# Patient Record
Sex: Male | Born: 1980
Health system: Southern US, Community
[De-identification: ages and names within clinical notes are randomized; demographics above are authoritative.]

## PROBLEM LIST (undated history)

## (undated) DIAGNOSIS — I513 Intracardiac thrombosis, not elsewhere classified: Secondary | ICD-10-CM

## (undated) DIAGNOSIS — R0609 Other forms of dyspnea: Secondary | ICD-10-CM

## (undated) DIAGNOSIS — F329 Major depressive disorder, single episode, unspecified: Secondary | ICD-10-CM

## (undated) DIAGNOSIS — R0600 Dyspnea, unspecified: Secondary | ICD-10-CM

## (undated) DIAGNOSIS — F32A Depression, unspecified: Secondary | ICD-10-CM

## (undated) DIAGNOSIS — I5022 Chronic systolic (congestive) heart failure: Secondary | ICD-10-CM

## (undated) DIAGNOSIS — I5189 Other ill-defined heart diseases: Secondary | ICD-10-CM

## (undated) DIAGNOSIS — I10 Essential (primary) hypertension: Secondary | ICD-10-CM

## (undated) DIAGNOSIS — I509 Heart failure, unspecified: Secondary | ICD-10-CM

## (undated) DIAGNOSIS — H547 Unspecified visual loss: Secondary | ICD-10-CM

## (undated) DIAGNOSIS — F99 Mental disorder, not otherwise specified: Secondary | ICD-10-CM

## (undated) HISTORY — DX: Mental disorder, not otherwise specified: F99

## (undated) HISTORY — DX: Chronic systolic (congestive) heart failure: I50.22

## (undated) HISTORY — DX: Dyspnea, unspecified: R06.00

## (undated) HISTORY — DX: Heart failure, unspecified: I50.9

## (undated) HISTORY — PX: TOOTH EXTRACTION: SUR596

## (undated) HISTORY — PX: EYE SURGERY: SHX253

## (undated) HISTORY — DX: Other forms of dyspnea: R06.09

## (undated) HISTORY — DX: Intracardiac thrombosis, not elsewhere classified: I51.3

## (undated) HISTORY — DX: Other ill-defined heart diseases: I51.89

## (undated) NOTE — *Deleted (*Deleted)
***In Progress*** PCP:  [name], DO    Cardiologist:  No primary care provider on file. Primary HF: Dr. [name]  HPI:  Kenneth Robertson is a 40 y.o. male with history of HTN and nonischemic cardiomyopathy who presents for followup of CHF. He was admitted in 12/17 after 1 month of worsening dyspnea. He was volume overloaded on exam with EF 10-15% on echo. He was diuresed and RHC/LHC done. This showed no CAD. Cardiac output was low. Cardiac MRI in 4/18 showed severe LV dilation, EF 24%, prominent trabeculation (possible noncompaction), RV insertion LGE (nonspecific).   Echo in 6/20 showed that LV was severely dilated with EF 25-30%. CPX done in 7/19 surprisingly did not show a significant CHF limitation.  He saw Dr Graciela Husbands and ICD was recommended but he deferred.   Echo repeated 4/21, EF 35-40%, diffuse hypokinesis, normal RV.    He did not tolerate higher dose of Entresto, 49-51 mg dose, due to dizziness. He is back on 24-26 BID dose.   He recently returned to HF Clinic for follow up on 01/30/20. Was doing well. Denied dyspnea. No exertional symptoms. Denied orthopnea/PND or Mill. His weight was up 8 lb since his last visit in April. Reported full med compliance. BP was well controlled. Compliant w/ coumadin. Denied abnormal bleeding.   Today he returns to HF clinic for pharmacist medication titration. At last visit with [facility], Farxiga 10 mg daily***.   Marland Kitchen Shortness of breath/dyspnea on exertion? {YES [id number]  . Orthopnea/PND? {YES [id number] . Edema? {YES [id number] . Lightheadedness/dizziness? {YES [id number] . Daily weights at home? {YES [id number] . Blood pressure/heart rate monitoring at home? {YES [id number] . Following low-sodium/fluid-restricted diet? {YES NO:22349}  HF Medications: Carvedilol 6.25 mg BID Entresto 24/26 mg BID Spironolactone 25 mg daily Dapagliflozin 10 mg daily Hydralazine 25 mg TID Isosorbide mononitrate 30 mg daily Digoxin 0.125 mg daily  Furosemide 20 mg daily Potassium chloride 20 mEq daily  Has the patient been experiencing any side effects to the medications prescribed?  {YES NO:22349}  Does the patient have any problems obtaining medications due to transportation or finances?   {YES NO:22349}  Understanding of regimen: {excellent/good/fair/poor:19665} Understanding of indications: {excellent/good/fair/poor:19665} Potential of compliance: {excellent/good/fair/poor:19665} Patient understands to avoid NSAIDs. Patient understands to avoid decongestants.    Pertinent Lab Values: . Serum creatinine ***, BUN ***, Potassium ***, Sodium ***, BNP ***, Magnesium ***, Digoxin ***   Vital Signs: . Weight: *** (last clinic weight: ***) . Blood pressure: ***  . Heart rate: ***   Assessment: 1. Chronic systolic CHF: Nonischemic cardiomyopathy. EF 10-15% by echo in 12/17. No coronary disease and low output on cath. HIV negative, SPEP negative. No family history of early cardiomyopathy (mother with CHF around 68). No ETOH/drugs. Possible viral myocarditis. Cardiac MRI in 4/18 with severe LV dilation, EF 24%, RV insertion site LGE (nonspecific), cannot rule out noncompaction. Echo in 5/19 showed EF 25-30% with severe LV dilation.CPX in 7/19 was quite good with minimal cardiac limitation. Echo in 6/20 showed severe LV dilation and EF 25-30%, normal RV systolic function.  Most recent echo 4/21 showed EF improved to 35-40%.   - NYHA Class II. Wt up 8 lb since last visit in April - He did not tolerate 49-51 mg dose of Entresto due to dizziness -Continue carvedilol 6.25 mg BID. - Continue Entresto 24-26 mg BID - Continue Spironolactone 25 mg daily - Continue Farxiga 10 mg daily.   - Continue digoxin 0.125 mg daily - Continue current hydralazine  25 mg TID and Imdur 30 mg daily.  - EF now out of range for ICD  2. LV thrombus: Not seen on more recent studies.   - Continue warfarin for anticoagulation - followed by coumadin  clinic    Plan: 1) Medication changes: Based on clinical presentation, vital signs and recent labs will *** 2) Labs: *** 3) Follow-up: ***   Karle Plumber, PharmD, BCPS, BCCP, CPP Heart Failure Clinic Pharmacist [phone]

---
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
## 2002-08-07 ENCOUNTER — Emergency Department (HOSPITAL_COMMUNITY): Admission: EM | Admit: 2002-08-07 | Discharge: 2002-08-07 | Payer: Self-pay | Admitting: Emergency Medicine

## 2002-08-15 ENCOUNTER — Emergency Department (HOSPITAL_COMMUNITY): Admission: EM | Admit: 2002-08-15 | Discharge: 2002-08-15 | Payer: Self-pay | Admitting: Emergency Medicine

## 2006-04-18 ENCOUNTER — Emergency Department (HOSPITAL_COMMUNITY): Admission: EM | Admit: 2006-04-18 | Discharge: 2006-04-18 | Payer: Self-pay | Admitting: Emergency Medicine

## 2006-05-24 ENCOUNTER — Emergency Department (HOSPITAL_COMMUNITY): Admission: EM | Admit: 2006-05-24 | Discharge: 2006-05-24 | Payer: Self-pay | Admitting: Emergency Medicine

## 2011-05-31 ENCOUNTER — Emergency Department (HOSPITAL_COMMUNITY)
Admission: EM | Admit: 2011-05-31 | Discharge: 2011-05-31 | Disposition: A | Discharge status: Home / Self Care | Payer: Self-pay | Attending: Emergency Medicine | Admitting: Emergency Medicine

## 2011-05-31 ENCOUNTER — Encounter (HOSPITAL_COMMUNITY): Payer: Self-pay | Admitting: *Deleted

## 2011-05-31 DIAGNOSIS — K029 Dental caries, unspecified: Secondary | ICD-10-CM | POA: Insufficient documentation

## 2011-05-31 DIAGNOSIS — K0889 Other specified disorders of teeth and supporting structures: Secondary | ICD-10-CM

## 2011-05-31 DIAGNOSIS — K089 Disorder of teeth and supporting structures, unspecified: Secondary | ICD-10-CM | POA: Insufficient documentation

## 2011-05-31 MED ORDER — IBUPROFEN 800 MG PO TABS: 800.0000 mg | ORAL_TABLET | Freq: Three times a day (TID) | ORAL | Status: AC

## 2011-05-31 MED ORDER — BUPIVACAINE-EPINEPHRINE 0.5% -1:200000 IJ SOLN
10.0000 mL | Freq: Once | INTRAMUSCULAR | Status: DC
  Filled 2011-05-31: qty 10

## 2011-05-31 MED ORDER — PENICILLIN V POTASSIUM 500 MG PO TABS: 500.0000 mg | ORAL_TABLET | Freq: Three times a day (TID) | ORAL | Status: AC

## 2011-05-31 MED ORDER — BUPIVACAINE-EPINEPHRINE PF 0.5-1:200000 % IJ SOLN
INTRAMUSCULAR | Status: AC
  Administered 2011-05-31: 18 mg
  Filled 2011-05-31: qty 3.6

## 2011-05-31 MED ORDER — BUPIVACAINE-EPINEPHRINE PF 0.5-1:200000 % IJ SOLN
3.6000 mL | Freq: Once | INTRAMUSCULAR | Status: AC
  Administered 2011-05-31: 18 mg

## 2011-05-31 MED ORDER — HYDROCODONE-ACETAMINOPHEN 5-325 MG PO TABS: 1.0000 | ORAL_TABLET | Freq: Four times a day (QID) | ORAL | Status: AC | PRN

## 2011-05-31 NOTE — ED Notes (Signed)
Pt expresses that he is having a toothache on the lower left side of his mouth x 2 days.  Pt states that the only thing that will relieve the pain is "really cold cold water".

## 2011-05-31 NOTE — Discharge Instructions (Signed)

## 2011-05-31 NOTE — ED Notes (Signed)
Dental pain lower jaw x 2 days.

## 2011-05-31 NOTE — ED Provider Notes (Signed)
History     CSN: [id number]  Arrival date & time 05/31/11  0617  6:28 AM HPI Patient reports severe left lower toothache for 2 days. Reports his to see Dr. Warren Danes but has not seen him since 2007. Denies fever, purulent drainage, mass, difficulty swallowing difficulty speaking, sore throat, neck pain. Patient is a 31 y.o. male presenting with tooth pain. The history is provided by the patient.  Dental PainPrimary symptoms do not include oral lesions or headaches. The symptoms began 2 days ago. The symptoms are worsening. The symptoms occur constantly.  Additional symptoms include: dental sensitivity to temperature. Additional symptoms do not include: gum swelling, gum tenderness, purulent gums, trismus, facial swelling, trouble swallowing, pain with swallowing, ear pain, hearing loss and swollen glands.    No past medical history on file.  No past surgical history on file.  No family history on file.  History  Substance Use Topics  . Smoking status: Not on file  . Smokeless tobacco: Not on file  . Alcohol Use:       Review of Systems  HENT: Positive for dental problem. Negative for hearing loss, ear pain, facial swelling, mouth sores, trouble swallowing and neck stiffness.   Eyes: Negative for photophobia and pain.  Neurological: Negative for weakness and headaches.    Allergies  Review of patient's allergies indicates no known allergies.  Home Medications  No current outpatient prescriptions on file.  BP 157/114  Pulse 88  Temp(Src) 99 F (37.2 C) (Oral)  Resp 18  SpO2 100%  Physical Exam  Vitals reviewed. Constitutional: He is oriented to person, place, and time. He appears well-developed and well-nourished.  HENT:  Head: Normocephalic and atraumatic.  Nose: Nose normal.  Mouth/Throat: Oropharynx is clear and moist. No oropharyngeal exudate.    Eyes: Pupils are equal, round, and reactive to light.  Lymphadenopathy:    He has no cervical adenopathy.    Neurological: He is alert and oriented to person, place, and time.  Skin: Skin is warm and dry. No rash noted. No erythema. No pallor.  Psychiatric: He has a normal mood and affect. His behavior is normal.    ED Course  Dental Date/Time: 05/31/2011 7:09 AM Performed by: Thomasene Lot Authorized by: Thomasene Lot Consent: Verbal consent obtained. Consent given by: patient Patient understanding: patient states understanding of the procedure being performed Imaging studies: imaging studies not available Patient identity confirmed: verbally with patient Time out: Immediately prior to procedure a "time out" was called to verify the correct patient, procedure, equipment, support staff and site/side marked as required. Local anesthesia used: yes Anesthesia: nerve block Local anesthetic: bupivacaine 0.5% with epinephrine Anesthetic total: 3 ml Patient tolerance: Patient tolerated the procedure well with no immediate complications. Comments: Inferior Alveolar nerve block on left side using a 27 G needle and 3cc of bupivicaine     MDM    Provided patient with  4 Vicodin, Penicillin, and ibuprofen. Also gave the patient a referral to Dr. Lucky Cowboy, DDS. Advise to follow-up today or tomorrow. Patient voices understanding and is ready for d/c      Thomasene Lot, Cordelia Poche 05/31/11 0710  Medical screening examination/treatment/procedure(s) were performed by non-physician practitioner and as supervising physician I was immediately available for consultation/collaboration.  Sunnie Nielsen, MD 06/01/11 [phone]

## 2011-09-06 ENCOUNTER — Emergency Department (HOSPITAL_COMMUNITY)
Admission: EM | Admit: 2011-09-06 | Discharge: 2011-09-07 | Disposition: A | Discharge status: Other Acute Inpatient Hospital | Payer: Self-pay | Attending: Emergency Medicine | Admitting: Emergency Medicine

## 2011-09-06 ENCOUNTER — Encounter (HOSPITAL_COMMUNITY): Payer: Self-pay | Admitting: Emergency Medicine

## 2011-09-06 DIAGNOSIS — F329 Major depressive disorder, single episode, unspecified: Secondary | ICD-10-CM

## 2011-09-06 DIAGNOSIS — R079 Chest pain, unspecified: Secondary | ICD-10-CM | POA: Insufficient documentation

## 2011-09-06 HISTORY — DX: Major depressive disorder, single episode, unspecified: F32.9

## 2011-09-06 HISTORY — DX: Depression, unspecified: F32.A

## 2011-09-06 HISTORY — DX: Unspecified visual loss: H54.7

## 2011-09-06 LAB — CBC
MCH: 28.3 pg (ref 26.0–34.0)
MCHC: 35.3 g/dL (ref 30.0–36.0)
MCV: 80.3 fL (ref 78.0–100.0)
Platelets: 282 10*3/uL (ref 150–400)
RBC: 5.58 MIL/uL (ref 4.22–5.81)
RDW: 13.2 % (ref 11.5–15.5)

## 2011-09-06 LAB — COMPREHENSIVE METABOLIC PANEL
AST: 21 U/L (ref 0–37)
CO2: 28 mEq/L (ref 19–32)
Calcium: 9.4 mg/dL (ref 8.4–10.5)
Creatinine, Ser: 1.07 mg/dL (ref 0.50–1.35)
GFR calc Af Amer: >90 mL/min (ref >90)
GFR calc non Af Amer: >90 mL/min (ref >90)
Sodium: 138 mEq/L (ref 135–145)
Total Protein: 7.4 g/dL (ref 6.0–8.3)

## 2011-09-06 LAB — RAPID URINE DRUG SCREEN, HOSP PERFORMED
Amphetamines: NOT DETECTED
Barbiturates: NOT DETECTED
Benzodiazepines: NOT DETECTED
Cocaine: NOT DETECTED

## 2011-09-06 NOTE — ED Provider Notes (Signed)
History     CSN: [id number]  Arrival date & time 09/06/11  1120   First MD Initiated Contact with Patient 09/06/11 1133     Chief complaint: depressed   (Consider location/radiation/quality/duration/timing/severity/associated sxs/prior treatment) The history is provided by the patient.  pt with hx depression, w worsening depression in past couple weeks. Notes multiple life stressors. Feels very depressed. Intermittent thoughts of suicide, 'jumping off bridge'. Denies overdose or attempt at self harm. No recent med use. Has no pcp or therapist/psychiatrist. States in past couple weeks also hearing voices telling himself to hurt himself. Denies any recent physical illness. No headaches. No cp or sob. No abd pain. Eating normally. Some trouble sleeping at night. Denies etoh or substance abuse.     No past medical history on file.  No past surgical history on file.  No family history on file.  History  Substance Use Topics  . Smoking status: Not on file  . Smokeless tobacco: Not on file  . Alcohol Use:       Review of Systems  Constitutional: Negative for fever.  HENT: Negative for neck pain.   Eyes: Negative for pain.  Respiratory: Negative for shortness of breath.   Cardiovascular: Negative for chest pain.  Gastrointestinal: Negative for abdominal pain.  Genitourinary: Negative for flank pain.  Musculoskeletal: Negative for back pain and gait problem.  Skin: Negative for rash.  Neurological: Negative for weakness, numbness and headaches.  Hematological: Does not bruise/bleed easily.  Psychiatric/Behavioral: Positive for dysphoric mood.    Allergies  Review of patient's allergies indicates no known allergies.  Home Medications   Current Outpatient Rx  Name Route Sig Dispense Refill  . ASPIRIN EC 81 MG PO TBEC Oral Take 325 mg by mouth every 6 (six) hours as needed. Pain      BP 139/107  Pulse 68  Temp(Src) 98.4 F (36.9 C) (Oral)  Resp 16  SpO2  99%  Physical Exam  Nursing note and vitals reviewed. Constitutional: He is oriented to person, place, and time. He appears well-developed and well-nourished. No distress.  HENT:  Head: Atraumatic.  Eyes: Conjunctivae are normal. Pupils are equal, round, and reactive to light. No scleral icterus.  Neck: Normal range of motion. Neck supple. No tracheal deviation present. No thyromegaly present.  Cardiovascular: Normal rate, regular rhythm, normal heart sounds and intact distal pulses.   Pulmonary/Chest: Effort normal and breath sounds normal. No accessory muscle usage. No respiratory distress.  Abdominal: Soft. He exhibits no distension. There is no tenderness.  Musculoskeletal: Normal range of motion.  Neurological: He is alert and oriented to person, place, and time.       Steady gait  Skin: Skin is warm and dry.  Psychiatric:       Depressed mood.     ED Course  Procedures (including critical care time)  Results for orders placed during the hospital encounter of 09/06/11  CBC      Component Value Range   WBC 3.8 (*) 4.0 - 10.5 (K/uL)   RBC 5.58  4.22 - 5.81 (MIL/uL)   Hemoglobin 15.8  13.0 - 17.0 (g/dL)   HCT 84.6  96.2 - 95.2 (%)   MCV 80.3  78.0 - 100.0 (fL)   MCH 28.3  26.0 - 34.0 (pg)   MCHC 35.3  30.0 - 36.0 (g/dL)   RDW 84.1  32.4 - 40.1 (%)   Platelets 282  150 - 400 (K/uL)  COMPREHENSIVE METABOLIC PANEL      Component Value  Range   Sodium 138  135 - 145 (mEq/L)   Potassium 3.5  3.5 - 5.1 (mEq/L)   Chloride 100  96 - 112 (mEq/L)   CO2 28  19 - 32 (mEq/L)   Glucose, Bld 97  70 - 99 (mg/dL)   BUN 8  6 - 23 (mg/dL)   Creatinine, Ser 1.61  0.50 - 1.35 (mg/dL)   Calcium 9.4  8.4 - 09.6 (mg/dL)   Total Protein 7.4  6.0 - 8.3 (g/dL)   Albumin 4.0  3.5 - 5.2 (g/dL)   AST 21  0 - 37 (U/L)   ALT 23  0 - 53 (U/L)   Alkaline Phosphatase 90  39 - 117 (U/L)   Total Bilirubin 0.5  0.3 - 1.2 (mg/dL)   GFR calc non Af Amer >90  >90 (mL/min)   GFR calc Af Amer >90  >90  (mL/min)  URINE RAPID DRUG SCREEN (HOSP PERFORMED)      Component Value Range   Opiates NONE DETECTED  NONE DETECTED    Cocaine NONE DETECTED  NONE DETECTED    Benzodiazepines NONE DETECTED  NONE DETECTED    Amphetamines NONE DETECTED  NONE DETECTED    Tetrahydrocannabinol NONE DETECTED  NONE DETECTED    Barbiturates NONE DETECTED  NONE DETECTED   ETHANOL      Component Value Range   Alcohol, Ethyl (B) <11  0 - 11 (mg/dL)        MDM  Labs. Act team called.   Discussed w mobile crisis counselor, they are working on psych placement.  Signed out to oncoming EDP to follow up with mobile crisis to facilitate psych placement and transfer.      Suzi Roots, MD 09/06/11 [phone]

## 2011-09-06 NOTE — ED Notes (Signed)
Patient's one bag of belongings is in locker 4 of triage. 

## 2011-09-06 NOTE — BHH Counselor (Signed)
Accepted to [facility] per [facility] with mobile Crises. Patient awaiting transport via Sheriff. Witnessed Dawn with mobile crises call EDP s to inform of patients acceptance to [facility].

## 2011-09-06 NOTE — ED Notes (Signed)
Sheriffs Office called to transport pt to [facility]. Sts pt will not be able to be transported until tomorrow morning due to length of trip. Call Sgt. Inda Merlin [phone] at [phone] Tuesday morning and leave message to verify transport.   Pt being accepted at [facility], [address] [place], [place]. Accepting physician Dr. Michaelle Birks. Nurse to nurse report [phone]. Hospital updated on transportation.

## 2011-09-06 NOTE — ED Notes (Addendum)
Pt presenting to ed with c/o medical clearance pt states depression and suicidal thoughts today pt states he was planning to jump off a bridge. Pt is alert and oriented at this time. Pt brought in by mobile crisis. Pt called behavioral health no beds available [facility] called mobile crisis and they brought pt to ed. Mobile crisis states pt has also been hearing voices and seeing people who have passed away. Pt is also having feelings that his family is out to hurt him. Pt states some chest soreness that's been going on for a couple weeks.

## 2011-09-06 NOTE — ED Notes (Signed)
Patient has one bag of belongings in locker 28. 

## 2011-09-06 NOTE — ED Notes (Signed)
Pt states that he has been very depressed since the death of his father in 2008-09-19. States that he hears voices telling him to harm himself and other voices telling him to seek treatment. States that he has a plan to jump off the bridge [place]. Also reports that he is very angry with himself, blames himself for the death of his father. States that he has pushed away his friends and family and feels as if he has no one to talk to.

## 2011-09-06 NOTE — ED Notes (Signed)
Care of pt assumed. Pt reports depression and SI for "years", worse in last few days. Plan to jump off bridge near his house. Was brought in by mobile crisis, who has since left. Denies SA previously. Sts he tried to stab himself in the arm 2-3 weeks ago. No injury noted.

## 2011-09-06 NOTE — ED Notes (Signed)
Pt out to desk to use phone x1.

## 2011-09-06 NOTE — ED Notes (Signed)
Pt belongings in locker. 

## 2011-09-06 NOTE — ED Notes (Signed)
[employer] served IVC papers taken out by EDP, [place]. Pt responded well, verbalizes understanding. Mobile crisis worker at bedside.

## 2011-09-06 NOTE — ED Notes (Signed)
Mobile crisis worker is at bedside

## 2011-09-06 NOTE — ED Notes (Signed)
RN, Elveria Royals made aware of pt BP

## 2011-09-17 ENCOUNTER — Encounter (HOSPITAL_COMMUNITY): Payer: Self-pay

## 2011-09-17 ENCOUNTER — Emergency Department (HOSPITAL_COMMUNITY): Payer: Medicaid Other

## 2011-09-17 ENCOUNTER — Emergency Department (HOSPITAL_COMMUNITY)
Admission: EM | Admit: 2011-09-17 | Discharge: 2011-09-17 | Disposition: A | Discharge status: Home / Self Care | Payer: Medicaid Other | Attending: Emergency Medicine | Admitting: Emergency Medicine

## 2011-09-17 DIAGNOSIS — K219 Gastro-esophageal reflux disease without esophagitis: Secondary | ICD-10-CM | POA: Insufficient documentation

## 2011-09-17 DIAGNOSIS — Z79899 Other long term (current) drug therapy: Secondary | ICD-10-CM | POA: Insufficient documentation

## 2011-09-17 DIAGNOSIS — I10 Essential (primary) hypertension: Secondary | ICD-10-CM | POA: Insufficient documentation

## 2011-09-17 DIAGNOSIS — R0789 Other chest pain: Secondary | ICD-10-CM

## 2011-09-17 DIAGNOSIS — F3289 Other specified depressive episodes: Secondary | ICD-10-CM | POA: Insufficient documentation

## 2011-09-17 DIAGNOSIS — F329 Major depressive disorder, single episode, unspecified: Secondary | ICD-10-CM | POA: Insufficient documentation

## 2011-09-17 HISTORY — DX: Essential (primary) hypertension: I10

## 2011-09-17 LAB — CBC
HCT: 42.5 % (ref 39.0–52.0)
Hemoglobin: 15.1 g/dL (ref 13.0–17.0)
MCH: 28.2 pg (ref 26.0–34.0)
MCHC: 35.5 g/dL (ref 30.0–36.0)
RDW: 13.3 % (ref 11.5–15.5)

## 2011-09-17 LAB — BASIC METABOLIC PANEL
BUN: 11 mg/dL (ref 6–23)
CO2: 27 mEq/L (ref 19–32)
Calcium: 9.1 mg/dL (ref 8.4–10.5)
Chloride: 101 mEq/L (ref 96–112)
Creatinine, Ser: 1.02 mg/dL (ref 0.50–1.35)

## 2011-09-17 MED ORDER — FAMOTIDINE 20 MG PO TABS
20.0000 mg | ORAL_TABLET | Freq: Once | ORAL | Status: AC
  Administered 2011-09-17: 20 mg via ORAL
  Filled 2011-09-17: qty 1

## 2011-09-17 MED ORDER — GI COCKTAIL ~~LOC~~
30.0000 mL | Freq: Once | ORAL | Status: AC
  Administered 2011-09-17: 30 mL via ORAL
  Filled 2011-09-17: qty 30

## 2011-09-17 MED ORDER — OMEPRAZOLE 20 MG PO CPDR: 20.0000 mg | DELAYED_RELEASE_CAPSULE | Freq: Every day | ORAL | Status: DC

## 2011-09-17 MED ORDER — FAMOTIDINE 20 MG PO TABS: 20.0000 mg | ORAL_TABLET | Freq: Two times a day (BID) | ORAL | Status: DC

## 2011-09-17 NOTE — ED Notes (Signed)
Requesting to speak with social Investment banker, operational regarding inability to afford meds. Voicemail left for [employer], Sports coach.

## 2011-09-17 NOTE — ED Notes (Signed)
Requests to see social worker regarding inability to afford medicine.

## 2011-09-17 NOTE — ED Provider Notes (Signed)
History     CSN: [id number]  Arrival date & time 09/17/11  0701   First MD Initiated Contact with Patient 09/17/11 0747      Chief Complaint  Patient presents with  . Chest Pain  . Sore Throat    (Consider location/radiation/quality/duration/timing/severity/associated sxs/prior treatment) Patient is a 31 y.o. male presenting with chest pain and pharyngitis. The history is provided by the patient. No language interpreter was used.  Chest Pain The chest pain began more than 2 weeks ago (approximately 1 month). Chest pain occurs intermittently. The chest pain is unchanged. Associated with: activity. The severity of the pain is mild. The quality of the pain is described as aching and dull. The pain does not radiate. Chest pain is worsened by certain positions (activity). Pertinent negatives for primary symptoms include no fever, no fatigue, no shortness of breath, no cough, no palpitations, no abdominal pain, no nausea, no vomiting and no dizziness.  Pertinent negatives for associated symptoms include no claudication, no diaphoresis, no lower extremity edema, no numbness and no weakness. He tried nothing for the symptoms.    Sore Throat This is a new problem. The current episode started more than 2 days ago. The problem occurs constantly. The problem has not changed since onset.Associated symptoms include chest pain. Pertinent negatives include no abdominal pain, no headaches and no shortness of breath. The symptoms are aggravated by swallowing. He has tried nothing for the symptoms. The treatment provided no relief.    Past Medical History  Diagnosis Date  . Depression   . Visual impairment   . Hypertension     History reviewed. No pertinent past surgical history.  Family History  Problem Relation Age of Onset  . Asthma Mother   . Cancer Father     History  Substance Use Topics  . Smoking status: Never Smoker   . Smokeless tobacco: Not on file  . Alcohol Use: Yes   socially      Review of Systems  Constitutional: Negative for fever, diaphoresis, activity change, appetite change and fatigue.  HENT: Positive for congestion and sore throat. Negative for rhinorrhea, neck pain and neck stiffness.   Respiratory: Negative for cough and shortness of breath.   Cardiovascular: Positive for chest pain. Negative for palpitations and claudication.  Gastrointestinal: Negative for nausea, vomiting, abdominal pain and diarrhea.  Genitourinary: Negative for dysuria, urgency, frequency and flank pain.  Musculoskeletal: Negative for myalgias, back pain and arthralgias.  Neurological: Negative for dizziness, weakness, light-headedness, numbness and headaches.  Psychiatric/Behavioral: Negative for suicidal ideas, self-injury and dysphoric mood. The patient is not nervous/anxious.   All other systems reviewed and are negative.    Allergies  Review of patient's allergies indicates no known allergies.  Home Medications   Current Outpatient Rx  Name Route Sig Dispense Refill  . BENZTROPINE MESYLATE 1 MG PO TABS Oral Take 1 mg by mouth 2 (two) times daily.    Marland Kitchen HYDROXYZINE PAMOATE 25 MG PO CAPS Oral Take 25 mg by mouth 3 (three) times daily as needed. For anxiety.    Marland Kitchen MIRTAZAPINE 15 MG PO TABS Oral Take 30 mg by mouth at bedtime.    Marland Kitchen RISPERIDONE 1 MG PO TABS Oral Take 1 mg by mouth 2 (two) times daily.    Marland Kitchen FAMOTIDINE 20 MG PO TABS Oral Take 1 tablet (20 mg total) by mouth 2 (two) times daily. 30 tablet 0  . OMEPRAZOLE 20 MG PO CPDR Oral Take 1 capsule (20 mg total) by mouth  daily. 30 capsule 0    BP 144/113  Pulse 77  Temp 98.2 F (36.8 C) (Oral)  Wt 248 lb 4 oz (112.605 kg)  SpO2 99%  Physical Exam  Nursing note and vitals reviewed. Constitutional: He is oriented to person, place, and time. He appears well-developed and well-nourished. No distress.  HENT:  Head: Normocephalic and atraumatic.  Mouth/Throat: Oropharynx is clear and moist. No oropharyngeal  exudate.  Eyes: Conjunctivae and EOM are normal. Pupils are equal, round, and reactive to light.  Neck: Normal range of motion. Neck supple.  Cardiovascular: Normal rate, regular rhythm, normal heart sounds and intact distal pulses.  Exam reveals no gallop and no friction rub.   No murmur heard. Pulmonary/Chest: Effort normal and breath sounds normal. No respiratory distress. He exhibits tenderness (tenderness on palpation of superior and parasternal chest).  Abdominal: Soft. Bowel sounds are normal. There is tenderness (mild epigastric tenderness on palpation). There is no rebound and no guarding.  Musculoskeletal: Normal range of motion. He exhibits no edema and no tenderness.  Neurological: He is alert and oriented to person, place, and time. No cranial nerve deficit.  Skin: Skin is warm and dry. No rash noted.    ED Course  Procedures (including critical care time)   Date: 09/17/2011  Rate: 76  Rhythm: normal sinus rhythm  QRS Axis: normal  Intervals: normal  ST/T Wave abnormalities: normal  Conduction Disutrbances:none  Narrative Interpretation:   Old EKG Reviewed: none available  Labs Reviewed  BASIC METABOLIC PANEL - Abnormal; Notable for the following:    Glucose, Bld 114 (*)     All other components within normal limits  CBC   Dg Chest 2 View  09/17/2011  *RADIOLOGY REPORT*  Clinical Data: Chest pain with shortness of breath and cough. History of hypertension.  CHEST - 2 VIEW  Comparison: None.  Findings: The heart size and mediastinal contours are normal. The lungs are clear. There is no pleural effusion or pneumothorax. No acute osseous findings are identified.  IMPRESSION: No active cardiopulmonary process.  Original Report Authenticated By: Gerrianne Scale, M.D.     1. Chest pain, atypical   2. GERD (gastroesophageal reflux disease)       MDM  PERC negative with low clinical gestalt for PE.  No concern for ACS. With chest discomfort and associated sore throat  I feel there is a component of reflux esophagitis. He received a GI cocktail and Pepcid in the emergency department. He'll be discharged home on Pepcid and Prilosec. Instructed to establish care with a primary care physician. Provided strict return precautions.        Dayton Bailiff, MD 09/17/11 [phone]

## 2011-09-17 NOTE — Progress Notes (Signed)
Kenneth Robertson from [facility] pharmacy confirms pt eligible for [facility] indigent medication assistance. Updated ED RN Approved Rx tubed to [facility] pharmacy Reviewed homeless IRC information and [facility] indigent program with pt.  Provided with written resources for self pay pcps including general medical clinic and [facility], medication resources, housing resources DSS, health department and [place] crisis programs

## 2011-09-17 NOTE — Discharge Instructions (Signed)
Chest Pain (Nonspecific) It is often hard to give a specific diagnosis for the cause of chest pain. There is always a chance that your pain could be related to something serious, such as a heart attack or a blood clot in the lungs. You need to follow up with your caregiver for further evaluation. CAUSES   Heartburn.   Pneumonia or bronchitis.   Anxiety or stress.   Inflammation around your heart (pericarditis) or lung (pleuritis or pleurisy).   A blood clot in the lung.   A collapsed lung (pneumothorax). It can develop suddenly on its own (spontaneous pneumothorax) or from injury (trauma) to the chest.   Shingles infection (herpes zoster virus).  The chest wall is composed of bones, muscles, and cartilage. Any of these can be the source of the pain.  The bones can be bruised by injury.   The muscles or cartilage can be strained by coughing or overwork.   The cartilage can be affected by inflammation and become sore (costochondritis).  DIAGNOSIS  Lab tests or other studies, such as X-rays, electrocardiography, stress testing, or cardiac imaging, may be needed to find the cause of your pain.  TREATMENT   Treatment depends on what may be causing your chest pain. Treatment may include:   Acid blockers for heartburn.   Anti-inflammatory medicine.   Pain medicine for inflammatory conditions.   Antibiotics if an infection is present.   You may be advised to change lifestyle habits. This includes stopping smoking and avoiding alcohol, caffeine, and chocolate.   You may be advised to keep your head raised (elevated) when sleeping. This reduces the chance of acid going backward from your stomach into your esophagus.   Most of the time, nonspecific chest pain will improve within 2 to 3 days with rest and mild pain medicine.  HOME CARE INSTRUCTIONS   If antibiotics were prescribed, take your antibiotics as directed. Finish them even if you start to feel better.   For the next few  days, avoid physical activities that bring on chest pain. Continue physical activities as directed.   Do not smoke.   Avoid drinking alcohol.   Only take over-the-counter or prescription medicine for pain, discomfort, or fever as directed by your caregiver.   Follow your caregiver's suggestions for further testing if your chest pain does not go away.   Keep any follow-up appointments you made. If you do not go to an appointment, you could develop lasting (chronic) problems with pain. If there is any problem keeping an appointment, you must call to reschedule.  SEEK MEDICAL CARE IF:   You think you are having problems from the medicine you are taking. Read your medicine instructions carefully.   Your chest pain does not go away, even after treatment.   You develop a rash with blisters on your chest.  SEEK IMMEDIATE MEDICAL CARE IF:   You have increased chest pain or pain that spreads to your arm, neck, jaw, back, or abdomen.   You develop shortness of breath, an increasing cough, or you are coughing up blood.   You have severe back or abdominal pain, feel nauseous, or vomit.   You develop severe weakness, fainting, or chills.   You have a fever.  THIS IS AN EMERGENCY. Do not wait to see if the pain will go away. Get medical help at once. Call your local emergency services (911 in [place] Do not drive yourself to the hospital. MAKE SURE YOU:   Understand these instructions.     Will watch your condition.   Will get help right away if you are not doing well or get worse.  Document Released: 12/30/2004 Document Revised: 03/11/2011 Document Reviewed: 10/26/2007 [employer] Patient Information 2012 [employer]. 

## 2011-09-17 NOTE — ED Notes (Signed)
Patient reports recurrent chest pain that has been going on for one month. Patient. Patient c/o non-productive cough and sore throat.

## 2011-09-17 NOTE — Progress Notes (Signed)
Indigent medications for pt pending [facility] pharmacy processing of Rx(s) Will call ED RN when ready Pt voiced understanding and appreciation of services

## 2011-11-20 ENCOUNTER — Emergency Department (HOSPITAL_COMMUNITY)
Admission: EM | Admit: 2011-11-20 | Discharge: 2011-11-20 | Disposition: A | Discharge status: Home / Self Care | Payer: Medicaid Other | Attending: Emergency Medicine | Admitting: Emergency Medicine

## 2011-11-20 ENCOUNTER — Encounter (HOSPITAL_COMMUNITY): Payer: Self-pay | Admitting: Emergency Medicine

## 2011-11-20 DIAGNOSIS — I10 Essential (primary) hypertension: Secondary | ICD-10-CM | POA: Insufficient documentation

## 2011-11-20 DIAGNOSIS — J029 Acute pharyngitis, unspecified: Secondary | ICD-10-CM | POA: Insufficient documentation

## 2011-11-20 DIAGNOSIS — Z79899 Other long term (current) drug therapy: Secondary | ICD-10-CM | POA: Insufficient documentation

## 2011-11-20 LAB — RAPID STREP SCREEN (MED CTR MEBANE ONLY): Streptococcus, Group A Screen (Direct): NEGATIVE

## 2011-11-20 MED ORDER — HYDROCODONE-ACETAMINOPHEN 7.5-500 MG/15ML PO SOLN: 10.0000 mL | Freq: Four times a day (QID) | ORAL | Status: AC | PRN

## 2011-11-20 NOTE — ED Provider Notes (Signed)
Medical screening examination/treatment/procedure(s) were performed by non-physician practitioner and as supervising physician I was immediately available for consultation/collaboration. Devoria Albe, MD, Armando Gang   Ward Givens, MD 11/20/11 1946

## 2011-11-20 NOTE — ED Provider Notes (Signed)
History     CSN: [id number]  Arrival date & time 11/20/11  1115   First MD Initiated Contact with Patient 11/20/11 1122      Chief Complaint  Patient presents with  . Sore Throat    4 day hx of throat pain, difficulty swallowing    (Consider location/radiation/quality/duration/timing/severity/associated sxs/prior treatment) HPI Comments: Pt presents with sore throat while swallowing x4 days. Pt states that 6 days ago he had a cold, but feels as though he has gotten over it, and is no longer coughing or having a runny nose. Pt did not have this pain when he had the other cold symptoms. Pt states it is a sharp, sore pain that occurs only while swallowing and is not present at any other time. He is tolerating his oral secretions without difficulty. He also woke up yesterday morning with left eye discharge and redness, went to his eye doctor, and is being treated for conjunctivitis with eye drops. Pt denies fever, cough, shortness of breath or difficulty breathing, chest pain, ear pain, rhinorrhea, abdominal pain, nausea, vomiting.  Patient is a 31 y.o. male presenting with pharyngitis. The history is provided by the patient.  Sore Throat Associated symptoms include a sore throat. Pertinent negatives include no abdominal pain, chest pain, chills, fever, headaches, myalgias, nausea or vomiting.    Past Medical History  Diagnosis Date  . Depression   . Visual impairment   . Hypertension     Past Surgical History  Procedure Date  . Tooth extraction     Family History  Problem Relation Age of Onset  . Asthma Mother   . Cancer Father     History  Substance Use Topics  . Smoking status: Never Smoker   . Smokeless tobacco: Not on file  . Alcohol Use: No     socially      Review of Systems  Constitutional: Negative for fever and chills.  HENT: Positive for sore throat. Negative for hearing loss, ear pain, rhinorrhea and trouble swallowing.   Eyes: Positive for discharge and  redness.  Respiratory: Negative for shortness of breath.   Cardiovascular: Negative for chest pain.  Gastrointestinal: Negative for nausea, vomiting and abdominal pain.  Musculoskeletal: Negative for myalgias.  Neurological: Negative for headaches.    Allergies  Review of patient's allergies indicates no known allergies.  Home Medications   Current Outpatient Rx  Name Route Sig Dispense Refill  . BENZTROPINE MESYLATE 1 MG PO TABS Oral Take 1 mg by mouth 2 (two) times daily.    Marland Kitchen FAMOTIDINE 20 MG PO TABS Oral Take 20 mg by mouth 2 (two) times daily.    . GUAIFENESIN ER 600 MG PO TB12 Oral Take 1,200 mg by mouth once.    Marland Kitchen HYDROXYZINE PAMOATE 25 MG PO CAPS Oral Take 25 mg by mouth 3 (three) times daily as needed. For anxiety.    . IBUPROFEN 800 MG PO TABS Oral Take 800 mg by mouth every 8 (eight) hours as needed. pain    . MIRTAZAPINE 15 MG PO TABS Oral Take 30 mg by mouth at bedtime.    . OMEPRAZOLE 20 MG PO CPDR Oral Take 20 mg by mouth daily.    Marland Kitchen RISPERIDONE 1 MG PO TABS Oral Take 1 mg by mouth 2 (two) times daily.      BP 140/98  Pulse 92  Temp 98.6 F (37 C) (Oral)  Resp 18  SpO2 100%  Physical Exam  Nursing note and vitals reviewed. Constitutional:  He appears well-developed and well-nourished. No distress.  HENT:  Head: Normocephalic and atraumatic.  Mouth/Throat: Uvula is midline. Mucous membranes are not dry. No uvula swelling. Posterior oropharyngeal edema and posterior oropharyngeal erythema present. No oropharyngeal exudate or tonsillar abscesses.  Neck: Trachea normal, normal range of motion and phonation normal. Neck supple. No tracheal tenderness present. No tracheal deviation present.  Cardiovascular: Normal rate and regular rhythm.   Pulmonary/Chest: Effort normal and breath sounds normal. No stridor. No respiratory distress. He has no wheezes. He has no rales.  Lymphadenopathy:    He has no cervical adenopathy.  Neurological: He is alert.  Skin: He is not  diaphoretic.    ED Course  Procedures (including critical care time)   Labs Reviewed  RAPID STREP SCREEN   No results found.   1. Pharyngitis       MDM  Pt with 6 days of upper respiratory symptoms, complaining of sore throat (other symptoms are improving).  Pt with bilateral tonsillar swelling that is symmetric.  Pt is afebrile, nontoxic.  No airway concerns.  No paratracheal tenderness or anterior cervical lymphadenopathy. Strep screen is negative.  Doubt deeper infection.  Centor criteria show pt is low risk, will not send culture.  Pt taking 800mg  ibuprofen at home, will d/c home and add 10 doses of lortab elixir. Discussed all results with patient.  Pt given return precautions.  Pt verbalizes understanding and agrees with plan.           [place], [place] 11/20/11 1254

## 2011-11-20 NOTE — ED Notes (Signed)
Pt reports 4 day hx of sore throat.

## 2013-02-12 ENCOUNTER — Encounter (HOSPITAL_COMMUNITY): Payer: Self-pay | Admitting: Emergency Medicine

## 2013-02-12 ENCOUNTER — Emergency Department (HOSPITAL_COMMUNITY)
Admission: EM | Admit: 2013-02-12 | Discharge: 2013-02-12 | Disposition: A | Discharge status: Home / Self Care | Payer: Medicaid Other | Attending: Emergency Medicine | Admitting: Emergency Medicine

## 2013-02-12 ENCOUNTER — Emergency Department (HOSPITAL_COMMUNITY): Payer: Medicaid Other

## 2013-02-12 DIAGNOSIS — M549 Dorsalgia, unspecified: Secondary | ICD-10-CM | POA: Insufficient documentation

## 2013-02-12 DIAGNOSIS — R079 Chest pain, unspecified: Secondary | ICD-10-CM | POA: Insufficient documentation

## 2013-02-12 DIAGNOSIS — F3289 Other specified depressive episodes: Secondary | ICD-10-CM | POA: Insufficient documentation

## 2013-02-12 DIAGNOSIS — M79609 Pain in unspecified limb: Secondary | ICD-10-CM | POA: Insufficient documentation

## 2013-02-12 DIAGNOSIS — Z8669 Personal history of other diseases of the nervous system and sense organs: Secondary | ICD-10-CM | POA: Insufficient documentation

## 2013-02-12 DIAGNOSIS — I10 Essential (primary) hypertension: Secondary | ICD-10-CM | POA: Insufficient documentation

## 2013-02-12 DIAGNOSIS — IMO0001 Reserved for inherently not codable concepts without codable children: Secondary | ICD-10-CM | POA: Insufficient documentation

## 2013-02-12 DIAGNOSIS — F329 Major depressive disorder, single episode, unspecified: Secondary | ICD-10-CM | POA: Insufficient documentation

## 2013-02-12 DIAGNOSIS — M7918 Myalgia, other site: Secondary | ICD-10-CM

## 2013-02-12 LAB — CBC WITH DIFFERENTIAL/PLATELET
Basophils Absolute: 0 10*3/uL (ref 0.0–0.1)
Basophils Relative: 1 % (ref 0–1)
Eosinophils Absolute: 0.1 10*3/uL (ref 0.0–0.7)
Eosinophils Relative: 2 % (ref 0–5)
HCT: 44.2 % (ref 39.0–52.0)
Hemoglobin: 16.1 g/dL (ref 13.0–17.0)
MCH: 29.3 pg (ref 26.0–34.0)
MCHC: 36.4 g/dL — ABNORMAL HIGH (ref 30.0–36.0)
Monocytes Absolute: 0.4 10*3/uL (ref 0.1–1.0)
Monocytes Relative: 9 % (ref 3–12)
Neutro Abs: 2 10*3/uL (ref 1.7–7.7)
RDW: 13.1 % (ref 11.5–15.5)

## 2013-02-12 LAB — COMPREHENSIVE METABOLIC PANEL
AST: 25 U/L (ref 0–37)
Albumin: 3.9 g/dL (ref 3.5–5.2)
BUN: 13 mg/dL (ref 6–23)
Calcium: 9 mg/dL (ref 8.4–10.5)
Creatinine, Ser: 1.19 mg/dL (ref 0.50–1.35)
Total Bilirubin: 0.4 mg/dL (ref 0.3–1.2)
Total Protein: 7.5 g/dL (ref 6.0–8.3)

## 2013-02-12 LAB — TROPONIN I: Troponin I: <0.3 ng/mL (ref <0.30)

## 2013-02-12 MED ORDER — IBUPROFEN 600 MG PO TABS: 600.0000 mg | ORAL_TABLET | Freq: Four times a day (QID) | ORAL | Status: DC | PRN

## 2013-02-12 MED ORDER — TRAMADOL HCL 50 MG PO TABS: 50.0000 mg | ORAL_TABLET | Freq: Four times a day (QID) | ORAL | Status: DC | PRN

## 2013-02-12 NOTE — ED Notes (Signed)
Pt c/o left arm and neck pain x 1 week; pt denies obvious injury but sts pain worse with movement

## 2013-02-12 NOTE — ED Provider Notes (Signed)
CSN: [id number]     Arrival date & time 02/12/13  1555 History   First MD Initiated Contact with Patient 02/12/13 1602     Chief Complaint  Patient presents with  . Arm Pain  . Neck Pain   (Consider location/radiation/quality/duration/timing/severity/associated sxs/prior Treatment) Patient is a 32 y.o. male presenting with arm pain and neck pain. The history is provided by the patient.  Arm Pain This is a new problem. Associated symptoms include chest pain.  Neck Pain Associated symptoms: chest pain    patient presents with pain in his neck and down his arms. He states will go to his left chest. It is worse with movements. There is no trauma. No lightheadedness or dizziness. No fevers. No cough. No shortness of breath. It is a dull pain. No weakness. He denies drug use. He does not smoke.  Past Medical History  Diagnosis Date  . Depression   . Visual impairment   . Hypertension    Past Surgical History  Procedure Laterality Date  . Tooth extraction     Family History  Problem Relation Age of Onset  . Asthma Mother   . Cancer Father    History  Substance Use Topics  . Smoking status: Never Smoker   . Smokeless tobacco: Not on file  . Alcohol Use: No     Comment: socially    Review of Systems  Cardiovascular: Positive for chest pain.  Musculoskeletal: Positive for back pain and neck pain.    Allergies  Review of patient's allergies indicates no known allergies.  Home Medications   Current Outpatient Rx  Name  Route  Sig  Dispense  Refill  . ibuprofen (ADVIL,MOTRIN) 600 MG tablet   Oral   Take 1 tablet (600 mg total) by mouth every 6 (six) hours as needed.   20 tablet   0   . traMADol (ULTRAM) 50 MG tablet   Oral   Take 1 tablet (50 mg total) by mouth every 6 (six) hours as needed.   15 tablet   0    BP 136/105  Pulse 99  Temp(Src) 98.6 F (37 C) (Oral)  Resp 18  Ht 5\' 10"  (1.778 m)  Wt 239 lb 1.6 oz (108.455 kg)  BMI 34.31 kg/m2  SpO2  98% Physical Exam  Nursing note and vitals reviewed. Constitutional: He is oriented to person, place, and time. He appears well-developed and well-nourished.  HENT:  Head: Normocephalic and atraumatic.  Eyes: EOM are normal. Pupils are equal, round, and reactive to light.  Neck: Normal range of motion. Neck supple.  Cardiovascular: Normal rate, regular rhythm and normal heart sounds.   No murmur heard. Pulmonary/Chest: Effort normal and breath sounds normal.  Abdominal: Soft. Bowel sounds are normal. He exhibits no distension and no mass. There is no tenderness. There is no rebound and no guarding.  Musculoskeletal: Normal range of motion. He exhibits no edema.  Some tenderness of her trapezius medial on left neck. Neurovascular intact over bilateral hands. No tenderness of her chest  Neurological: He is alert and oriented to person, place, and time. No cranial nerve deficit.  Skin: Skin is warm and dry.  Psychiatric: He has a normal mood and affect.    ED Course  Procedures (including critical care time) Labs Review Labs Reviewed  CBC WITH DIFFERENTIAL - Abnormal; Notable for the following:    MCHC 36.4 (*)    All other components within normal limits  COMPREHENSIVE METABOLIC PANEL - Abnormal; Notable for the  following:    GFR calc non Af Amer 79 (*)    All other components within normal limits  TROPONIN I   Imaging Review No results found.  EKG Interpretation     Ventricular Rate:  76 PR Interval:  160 QRS Duration: 106 QT Interval:  378 QTC Calculation: 425 R Axis:   -11 Text Interpretation:  Normal sinus rhythm Normal ECG            MDM   1. Musculoskeletal pain    Patient with neck pain with radiation. Likely musculoskeletal. Doubt severe neurologic cause. Will discharge. Doubt cardiac cause    Juliet Rude. Rubin Payor, MD 02/15/13 [phone]

## 2013-02-12 NOTE — ED Provider Notes (Signed)
MSE was initiated and I personally evaluated the patient and placed orders (if any) at  5:41 PM on February 12, 2013.  Kenneth Robertson is a 32 y.o. Male with a history of HTN who presents to the Emergency Department complaining of constant, moderate left shoulder pain that radiates down his left arm over the past week. He denies any known injury occuring to have onset this pain. He states that this pain is worsened with movement of his left arm. He also states that he has had intermittent upper left chest pain over the past 3 days. He states that his episodes of chest pain have been brief, only lasting about 2 minutes. He describes this pain as "pressure". He states that his pain is not affected by palpation. He believes that his shoulder pain may be causing this chest pain, but wants to be evaluated as chest pain concerns him. He also states that he has been feeling fatigued in general over the past week. He states that he has taken nothing for symptoms. He denies cough, congestion, SOB or difficulty breathing, weakness, fever, chills, nausea, emesis, diarrhea, abdominal pain or any other symptoms. Alert and oriented. Heart rate and rhythm normal. Pulses palpable and strong, radial 2+ bilaterally. Lungs clear to auscultation to upper and lower lobes bilaterally. Patient presenting to emergency department with intermittent chest discomfort localized the left side described as a pressure sensation with radiation down the left arm. Patient reports that the chest discomfort last approximately a couple minutes. Denied any recent injury. Denied shortness of breath, difficulty breathing, weakness, numbness, tingling. Patient has history of hypertension and is not on any current blood pressure medications. Blood pressure is 136/105. Patient does not meet the fast-track criteria. Patient to be transferred to main ED for further workup to be performed. Orders have been placed. Patient stable for transfer.   The patient  appears stable so that the remainder of the MSE may be completed by another provider.  Raymon Mutton, PA-C 02/14/13 1313

## 2013-02-15 NOTE — ED Provider Notes (Signed)
Medical screening examination/treatment/procedure(s) were performed by non-physician practitioner and as supervising physician I was immediately available for consultation/collaboration.  EKG Interpretation     Ventricular Rate:  76 PR Interval:  160 QRS Duration: 106 QT Interval:  378 QTC Calculation: 425 R Axis:   -11 Text Interpretation:  Normal sinus rhythm Normal ECG             Raschelle Wisenbaker R. Rubin Payor, MD 02/15/13 [phone]

## 2014-02-04 DIAGNOSIS — F321 Major depressive disorder, single episode, moderate: Secondary | ICD-10-CM | POA: Diagnosis not present

## 2014-02-04 DIAGNOSIS — K5900 Constipation, unspecified: Secondary | ICD-10-CM | POA: Diagnosis not present

## 2014-02-04 DIAGNOSIS — G4730 Sleep apnea, unspecified: Secondary | ICD-10-CM | POA: Diagnosis not present

## 2014-02-04 DIAGNOSIS — H53032 Strabismic amblyopia, left eye: Secondary | ICD-10-CM | POA: Diagnosis not present

## 2014-03-06 DIAGNOSIS — H50122 Monocular exotropia with A pattern, left eye: Secondary | ICD-10-CM | POA: Diagnosis not present

## 2014-03-18 DIAGNOSIS — H50122 Monocular exotropia with A pattern, left eye: Secondary | ICD-10-CM | POA: Diagnosis not present

## 2014-03-27 DIAGNOSIS — H50122 Monocular exotropia with A pattern, left eye: Secondary | ICD-10-CM | POA: Diagnosis not present

## 2014-03-27 DIAGNOSIS — H5010 Unspecified exotropia: Secondary | ICD-10-CM | POA: Diagnosis not present

## 2014-05-06 ENCOUNTER — Encounter (HOSPITAL_COMMUNITY): Payer: Self-pay | Admitting: Emergency Medicine

## 2014-05-06 ENCOUNTER — Emergency Department (HOSPITAL_COMMUNITY)
Admission: EM | Admit: 2014-05-06 | Discharge: 2014-05-06 | Disposition: A | Discharge status: Home / Self Care | Payer: Medicaid Other | Attending: Emergency Medicine | Admitting: Emergency Medicine

## 2014-05-06 DIAGNOSIS — R079 Chest pain, unspecified: Secondary | ICD-10-CM | POA: Insufficient documentation

## 2014-05-06 DIAGNOSIS — J020 Streptococcal pharyngitis: Secondary | ICD-10-CM | POA: Diagnosis not present

## 2014-05-06 DIAGNOSIS — Z8669 Personal history of other diseases of the nervous system and sense organs: Secondary | ICD-10-CM | POA: Insufficient documentation

## 2014-05-06 DIAGNOSIS — J029 Acute pharyngitis, unspecified: Secondary | ICD-10-CM | POA: Diagnosis present

## 2014-05-06 DIAGNOSIS — F329 Major depressive disorder, single episode, unspecified: Secondary | ICD-10-CM | POA: Diagnosis not present

## 2014-05-06 DIAGNOSIS — IMO0001 Reserved for inherently not codable concepts without codable children: Secondary | ICD-10-CM

## 2014-05-06 DIAGNOSIS — I10 Essential (primary) hypertension: Secondary | ICD-10-CM | POA: Insufficient documentation

## 2014-05-06 DIAGNOSIS — R030 Elevated blood-pressure reading, without diagnosis of hypertension: Secondary | ICD-10-CM | POA: Diagnosis not present

## 2014-05-06 LAB — RAPID STREP SCREEN (MED CTR MEBANE ONLY): STREPTOCOCCUS, GROUP A SCREEN (DIRECT): POSITIVE — AB

## 2014-05-06 MED ORDER — PENICILLIN G BENZATHINE 1200000 UNIT/2ML IM SUSP
1.2000 10*6.[IU] | Freq: Once | INTRAMUSCULAR | Status: AC
  Administered 2014-05-06: 1.2 10*6.[IU] via INTRAMUSCULAR
  Filled 2014-05-06: qty 2

## 2014-05-06 NOTE — ED Notes (Signed)
Patient coming from home with c/o of central chest pain that started 2 days and pain on the left side of the throat that has been ongoing x 4 days.

## 2014-05-06 NOTE — Discharge Instructions (Signed)
You were treated today for strep throat. Change her toothbrush in 24 hours. Use salt water gargles. Continue taking ibuprofen or Tylenol every 6-8 hours as needed for pain. Follow-up with one of the resources below to establish care with a primary care physician or the wellness clinic.  Salt Water Gargle This solution will help make your mouth and throat feel better. HOME CARE INSTRUCTIONS   Mix 1 teaspoon of salt in 8 ounces of warm water.  Gargle with this solution as much or often as you need or as directed. Swish and gargle gently if you have any sores or wounds in your mouth.  Do not swallow this mixture. Document Released: 12/25/2003 Document Revised: 06/14/2011 Document Reviewed: 05/17/2008 [facility] Patient Information 2015 [place], [place]. This information is not intended to replace advice given to you by your health care provider. Make sure you discuss any questions you have with your health care provider.  Strep Throat Strep throat is an infection of the throat caused by a bacteria named Streptococcus pyogenes. Your health care provider may call the infection streptococcal "tonsillitis" or "pharyngitis" depending on whether there are signs of inflammation in the tonsils or back of the throat. Strep throat is most common in children aged 5-15 years during the cold months of the year, but it can occur in people of any age during any season. This infection is spread from person to person (contagious) through coughing, sneezing, or other close contact. SIGNS AND SYMPTOMS   Fever or chills.  Painful, swollen, red tonsils or throat.  Pain or difficulty when swallowing.  White or yellow spots on the tonsils or throat.  Swollen, tender lymph nodes or "glands" of the neck or under the jaw.  Red rash all over the body (rare). DIAGNOSIS  Many different infections can cause the same symptoms. A test must be done to confirm the diagnosis so the right treatment can be given. A "rapid strep  test" can help your health care provider make the diagnosis in a few minutes. If this test is not available, a light swab of the infected area can be used for a throat culture test. If a throat culture test is done, results are usually available in a day or two. TREATMENT  Strep throat is treated with antibiotic medicine. HOME CARE INSTRUCTIONS   Gargle with 1 tsp of salt in 1 cup of warm water, 3-4 times per day or as needed for comfort.  Family members who also have a sore throat or fever should be tested for strep throat and treated with antibiotics if they have the strep infection.  Make sure everyone in your household washes their hands well.  Do not share food, drinking cups, or personal items that could cause the infection to spread to others.  You may need to eat a soft food diet until your sore throat gets better.  Drink enough water and fluids to keep your urine clear or pale yellow. This will help prevent dehydration.  Get plenty of rest.  Stay home from school, day care, or work until you have been on antibiotics for 24 hours.  Take medicines only as directed by your health care provider.  Take your antibiotic medicine as directed by your health care provider. Finish it even if you start to feel better. SEEK MEDICAL CARE IF:   The glands in your neck continue to enlarge.  You develop a rash, cough, or earache.  You cough up green, yellow-brown, or bloody sputum.  You have pain  or discomfort not controlled by medicines.  Your problems seem to be getting worse rather than better.  You have a fever. SEEK IMMEDIATE MEDICAL CARE IF:   You develop any new symptoms such as vomiting, severe headache, stiff or painful neck, chest pain, shortness of breath, or trouble swallowing.  You develop severe throat pain, drooling, or changes in your voice.  You develop swelling of the neck, or the skin on the neck becomes red and tender.  You develop signs of dehydration, such  as fatigue, dry mouth, and decreased urination.  You become increasingly sleepy, or you cannot wake up completely. MAKE SURE YOU:  Understand these instructions.  Will watch your condition.  Will get help right away if you are not doing well or get worse. Document Released: 03/19/2000 Document Revised: 08/06/2013 Document Reviewed: 05/21/2010 [facility] Patient Information 2015 [place], [place]. This information is not intended to replace advice given to you by your health care provider. Make sure you discuss any questions you have with your health care provider.  How to Take Your Blood Pressure HOW DO I GET A BLOOD PRESSURE MACHINE?  You can buy an electronic home blood pressure machine at your local pharmacy. Insurance will sometimes cover the cost if you have a prescription.  Ask your doctor what type of machine is best for you. There are different machines for your arm and your wrist.  If you decide to buy a machine to check your blood pressure on your arm, first check the size of your arm so you can buy the right size cuff. To check the size of your arm:   Use a measuring tape that shows both inches and centimeters.   Wrap the measuring tape around the upper-middle part of your arm. You may need someone to help you measure.   Write down your arm measurement in both inches and centimeters.   To measure your blood pressure correctly, it is important to have the right size cuff.   If your arm is up to 13 inches (up to 34 centimeters), get an adult cuff size.  If your arm is 13 to 17 inches (35 to 44 centimeters), get a large adult cuff size.    If your arm is 17 to 20 inches (45 to 52 centimeters), get an adult thigh cuff.  WHAT DO THE NUMBERS MEAN?   There are two numbers that make up your blood pressure. For example: 120/80.  The first number (120 in our example) is called the "systolic pressure." It is a measure of the pressure in your blood vessels when your heart is  pumping blood.  The second number (80 in our example) is called the "diastolic pressure." It is a measure of the pressure in your blood vessels when your heart is resting between beats.  Your doctor will tell you what your blood pressure should be. WHAT SHOULD I DO BEFORE I CHECK MY BLOOD PRESSURE?   Try to rest or relax for at least 30 minutes before you check your blood pressure.  Do not smoke.  Do not have any drinks with caffeine, such as:  Soda.  Coffee.  Tea.  Check your blood pressure in a quiet room.  Sit down and stretch out your arm on a table. Keep your arm at about the level of your heart. Let your arm relax.  Make sure that your legs are not crossed. HOW DO I CHECK MY BLOOD PRESSURE?  Follow the directions that came with your machine.  Make sure you remove any tight-fighting clothing from your arm or wrist. Wrap the cuff around your upper arm or wrist. You should be able to fit a finger between the cuff and your arm. If you cannot fit a finger between the cuff and your arm, it is too tight and should be removed and rewrapped.  Some units require you to manually pump up the arm cuff.  Automatic units inflate the cuff when you press a button.  Cuff deflation is automatic in both models.  After the cuff is inflated, the unit measures your blood pressure and pulse. The readings are shown on a monitor. Hold still and breathe normally while the cuff is inflated.  Getting a reading takes less than a minute.  Some models store readings in a memory. Some provide a printout of readings. If your machine does not store your readings, keep a written record.  Take readings with you to your next visit with your doctor. Document Released: 03/04/2008 Document Revised: 08/06/2013 Document Reviewed: 05/17/2013 [facility] Patient Information 2015 [place], [place]. This information is not intended to replace advice given to you by your health care provider. Make sure you discuss any  questions you have with your health care provider.  Hypertension Hypertension, commonly called high blood pressure, is when the force of blood pumping through your arteries is too strong. Your arteries are the blood vessels that carry blood from your heart throughout your body. A blood pressure reading consists of a higher number over a lower number, such as 110/72. The higher number (systolic) is the pressure inside your arteries when your heart pumps. The lower number (diastolic) is the pressure inside your arteries when your heart relaxes. Ideally you want your blood pressure below 120/80. Hypertension forces your heart to work harder to pump blood. Your arteries may become narrow or stiff. Having hypertension puts you at risk for heart disease, stroke, and other problems.  RISK FACTORS Some risk factors for high blood pressure are controllable. Others are not.  Risk factors you cannot control include:   Race. You may be at higher risk if you are African American.  Age. Risk increases with age.  Gender. Men are at higher risk than women before age 61 years. After age 64, women are at higher risk than men. Risk factors you can control include:  Not getting enough exercise or physical activity.  Being overweight.  Getting too much fat, sugar, calories, or salt in your diet.  Drinking too much alcohol. SIGNS AND SYMPTOMS Hypertension does not usually cause signs or symptoms. Extremely high blood pressure (hypertensive crisis) may cause headache, anxiety, shortness of breath, and nosebleed. DIAGNOSIS  To check if you have hypertension, your health care provider will measure your blood pressure while you are seated, with your arm held at the level of your heart. It should be measured at least twice using the same arm. Certain conditions can cause a difference in blood pressure between your right and left arms. A blood pressure reading that is higher than normal on one occasion does not mean  that you need treatment. If one blood pressure reading is high, ask your health care provider about having it checked again. TREATMENT  Treating high blood pressure includes making lifestyle changes and possibly taking medicine. Living a healthy lifestyle can help lower high blood pressure. You may need to change some of your habits. Lifestyle changes may include:  Following the DASH diet. This diet is high in fruits, vegetables, and whole grains.  It is low in salt, red meat, and added sugars.  Getting at least 2 hours of brisk physical activity every week.  Losing weight if necessary.  Not smoking.  Limiting alcoholic beverages.  Learning ways to reduce stress. If lifestyle changes are not enough to get your blood pressure under control, your health care provider may prescribe medicine. You may need to take more than one. Work closely with your health care provider to understand the risks and benefits. HOME CARE INSTRUCTIONS  Have your blood pressure rechecked as directed by your health care provider.   Take medicines only as directed by your health care provider. Follow the directions carefully. Blood pressure medicines must be taken as prescribed. The medicine does not work as well when you skip doses. Skipping doses also puts you at risk for problems.   Do not smoke.   Monitor your blood pressure at home as directed by your health care provider. SEEK MEDICAL CARE IF:   You think you are having a reaction to medicines taken.  You have recurrent headaches or feel dizzy.  You have swelling in your ankles.  You have trouble with your vision. SEEK IMMEDIATE MEDICAL CARE IF:  You develop a severe headache or confusion.  You have unusual weakness, numbness, or feel faint.  You have severe chest or abdominal pain.  You vomit repeatedly.  You have trouble breathing. MAKE SURE YOU:   Understand these instructions.  Will watch your condition.  Will get help right  away if you are not doing well or get worse. Document Released: 03/22/2005 Document Revised: 08/06/2013 Document Reviewed: 01/12/2013 [facility] Patient Information 2015 [place], [place]. This information is not intended to replace advice given to you by your health care provider. Make sure you discuss any questions you have with your health care provider. RESOURCE GUIDE  Chronic Pain Problems: Contact [facility]  [phone] Patients need to be referred by their primary care doctor.  Insufficient Money for Medicine: Contact [employer]:  call "211."   No Primary Care Doctor: - Call [facility]  [phone] - can help you locate a primary care doctor that  accepts your insurance, provides certain services, etc. - Physician Referral Service- [phone]  Agencies that provide inexpensive medical care: - [facility]  [phone] - [facility]  [phone] - [facility]  [phone] - Women's Clinic  [phone] - [facility]  [phone] [facility]  [phone]  Medicaid-accepting [facility] Providers: - [facility]- [address]  [phone], Mon-Fri 9am-7pm, Sat 9am-1pm - [facility]- [address] [place], [address] [place]  [phone] - [facility]- [address] [place]  [phone] [facility]- [address]  [phone] - Renaye Rakers- [address] [place], [address], [phone]  Only accepts [place] patients after they have their name  applied to their card  Self Pay (no insurance) in [place]: - Sickle Cell Patients: Dr Willey Blade, [facility]  [address] [place], [phone] - [facility]- [address] [place]  [phone]       [facility] [place]- [address]       -     [facility]- see information above (Speak to [employer] if you  do not have insurance)       -  [facility]- [address],  [phone]       -  [facility]- [address] [phone]       -  Dr Julio Sicks-  [address], [place], [phone]       -  [facility] - [address], [phone]       -  [facility]- [address], [phone], also [address] [phone]       -    [facility]- [address] [place], [phone], 1st & 3rd Saturday        every month, 10am-1pm  1) Find a Doctor and Pay Out of Pocket Although you won't have to find out who is covered by your insurance plan, it is a good idea to ask around and get recommendations. You will then need to call the office and see if the doctor you have chosen will accept you as a new patient and what types of options they offer for patients who are self-pay. Some doctors offer discounts or will set up payment plans for their patients who do not have insurance, but you will need to ask so you aren't surprised when you get to your appointment.  2) Contact Your Local Health Department Not all health departments have doctors that can see patients for sick visits, but many do, so it is worth a call to see if yours does. If you don't know where your local health department is, you can check in your phone book. The CDC also has a tool to help you locate your state's health department, and many state websites also have listings of all of their local health departments.  3) Find a Walk-in Clinic If your illness is not likely to be very severe or complicated, you may want to try a walk in clinic. These are popping up all over the country in pharmacies, drugstores, and shopping centers. They're usually staffed by nurse practitioners or physician assistants that have been trained to treat common illnesses and complaints. They're usually fairly quick and inexpensive. However, if you have serious medical issues or chronic  medical problems, these are probably not your best option

## 2014-05-06 NOTE — ED Provider Notes (Signed)
CSN: [id number]     Arrival date & time 05/06/14  1191 History   First MD Initiated Contact with Patient 05/06/14 [phone]     Chief Complaint  Patient presents with  . Sore Throat  . Chest Pain     (Consider location/radiation/quality/duration/timing/severity/associated sxs/prior Treatment) HPI Comments: 34 y/o M PMHx of depression, anxiety and HTN presenting with sore throat x 4 days. Pain worse with swallowing. No difficulty swallowing. No cough. Admits to subjective fever/chills. Tried OTC cold medication with minimal relief. Has a friend sick with similar symptoms. Also endorses having mid-sternal CP 2 days ago which was since subsided after taking ibuprofen. Reports hx of anxiety and believes the CP feels like anxiety. It was a sharp, constant pain. Currently CP free. Denies sob, n/v. Non-smoker.  The history is provided by the patient.    Past Medical History  Diagnosis Date  . Depression   . Visual impairment   . Hypertension    Past Surgical History  Procedure Laterality Date  . Tooth extraction    . Eye surgery      Left    Family History  Problem Relation Age of Onset  . Asthma Mother   . Cancer Father    History  Substance Use Topics  . Smoking status: Never Smoker   . Smokeless tobacco: Not on file  . Alcohol Use: No     Comment: socially    Review of Systems  10 Systems reviewed and are negative for acute change except as noted in the HPI.  Allergies  Hydrocodone  Home Medications   Prior to Admission medications   Medication Sig Start Date End Date Taking? Authorizing Provider  ibuprofen (ADVIL,MOTRIN) 600 MG tablet Take 1 tablet (600 mg total) by mouth every 6 (six) hours as needed. 02/12/13  Yes Nathan R. Pickering, MD  traMADol (ULTRAM) 50 MG tablet Take 1 tablet (50 mg total) by mouth every 6 (six) hours as needed. Patient not taking: Reported on 05/06/2014 02/12/13   Juliet Rude. Pickering, MD   BP 130/107 mmHg  Pulse 84  Temp(Src) 98.2 F (36.8 C)  (Oral)  Resp 17  Ht  (1.778 m)  Wt 245 lb (111.131 kg)  BMI 35.15 kg/m2  SpO2 100% Physical Exam  Constitutional: He is oriented to person, place, and time. He appears well-developed and well-nourished. No distress.  HENT:  Head: Normocephalic and atraumatic.  Post oropharyngeal erythema and edema without exudate. Uvula midline. Postnasal drip.  Eyes: Conjunctivae and EOM are normal. Pupils are equal, round, and reactive to light.  Neck: Normal range of motion. Neck supple. No JVD present.  Cardiovascular: Normal rate, regular rhythm, normal heart sounds and intact distal pulses.   No extremity edema.  Pulmonary/Chest: Effort normal and breath sounds normal. No respiratory distress.  Abdominal: Soft. Bowel sounds are normal. There is no tenderness.  Musculoskeletal: Normal range of motion. He exhibits no edema.  Lymphadenopathy:    He has cervical adenopathy.  Neurological: He is alert and oriented to person, place, and time. He has normal strength. No sensory deficit.  Speech fluent, goal oriented. Moves limbs without ataxia. Equal grip strength bilateral.  Skin: Skin is warm and dry. He is not diaphoretic.  Psychiatric: He has a normal mood and affect. His behavior is normal.  Nursing note and vitals reviewed.   ED Course  Procedures (including critical care time) Labs Review Labs Reviewed  RAPID STREP SCREEN - Abnormal; Notable for the following:    Streptococcus, Group  A Screen (Direct) POSITIVE (*)    All other components within normal limits    Imaging Review No results found.   EKG Interpretation   Date/Time:  Monday May 06 2014 06:50:48 EST Ventricular Rate:  84 PR Interval:  158 QRS Duration: 102 QT Interval:  341 QTC Calculation: 403 R Axis:   -34 Text Interpretation:  Sinus rhythm Left axis deviation Borderline T wave  abnormalities since last tracing no significant change Confirmed by WENTZ   MD, ELLIOTT ([id number]) on 05/06/2014 8:06:58 AM       MDM   Final diagnoses:  Strep pharyngitis  Elevated blood pressure   Patient in no apparent distress. Afebrile, vital signs stable other than elevated blood pressure. Rapid strep positive. Will treat with IM Bicillin. Regarding chest pain, this has resolved on its own after ibuprofen. Doubt cardiac or pulmonary. Low risk. Admits to anxiety which is a possible cause. Regarding elevated blood pressure, resources given to establish care with PCP. Advised him to monitor blood pressure at drugstores. Stable for discharge. Return precautions given. Patient states understanding of treatment care plan and is agreeable.  Kathrynn Speed, PA-C 05/06/14 [id number]  Flint Melter, MD 05/06/14 [phone]

## 2014-05-09 ENCOUNTER — Ambulatory Visit (HOSPITAL_BASED_OUTPATIENT_CLINIC_OR_DEPARTMENT_OTHER): Payer: Medicaid Other | Attending: Internal Medicine

## 2014-07-19 DIAGNOSIS — Z13220 Encounter for screening for lipoid disorders: Secondary | ICD-10-CM | POA: Diagnosis not present

## 2014-07-19 DIAGNOSIS — Z131 Encounter for screening for diabetes mellitus: Secondary | ICD-10-CM | POA: Diagnosis not present

## 2014-07-19 DIAGNOSIS — Z113 Encounter for screening for infections with a predominantly sexual mode of transmission: Secondary | ICD-10-CM | POA: Diagnosis not present

## 2014-07-19 DIAGNOSIS — L219 Seborrheic dermatitis, unspecified: Secondary | ICD-10-CM | POA: Diagnosis not present

## 2014-07-19 DIAGNOSIS — F321 Major depressive disorder, single episode, moderate: Secondary | ICD-10-CM | POA: Diagnosis not present

## 2014-08-22 DIAGNOSIS — H50122 Monocular exotropia with A pattern, left eye: Secondary | ICD-10-CM | POA: Diagnosis not present

## 2014-09-26 ENCOUNTER — Encounter (HOSPITAL_BASED_OUTPATIENT_CLINIC_OR_DEPARTMENT_OTHER): Payer: Medicaid Other

## 2014-10-24 DIAGNOSIS — H5016 Alternating exotropia with A pattern: Secondary | ICD-10-CM | POA: Diagnosis not present

## 2014-10-28 DIAGNOSIS — H04123 Dry eye syndrome of bilateral lacrimal glands: Secondary | ICD-10-CM | POA: Diagnosis not present

## 2014-10-28 DIAGNOSIS — H4011X2 Primary open-angle glaucoma, moderate stage: Secondary | ICD-10-CM | POA: Diagnosis not present

## 2014-12-12 ENCOUNTER — Encounter (HOSPITAL_BASED_OUTPATIENT_CLINIC_OR_DEPARTMENT_OTHER): Payer: Medicaid Other

## 2015-03-05 ENCOUNTER — Encounter (HOSPITAL_COMMUNITY): Payer: Self-pay | Admitting: Emergency Medicine

## 2015-03-05 ENCOUNTER — Emergency Department (HOSPITAL_COMMUNITY)
Admission: EM | Admit: 2015-03-05 | Discharge: 2015-03-06 | Disposition: A | Discharge status: Home / Self Care | Payer: Medicare Other | Attending: Emergency Medicine | Admitting: Emergency Medicine

## 2015-03-05 DIAGNOSIS — R0602 Shortness of breath: Secondary | ICD-10-CM | POA: Insufficient documentation

## 2015-03-05 DIAGNOSIS — K029 Dental caries, unspecified: Secondary | ICD-10-CM | POA: Diagnosis not present

## 2015-03-05 DIAGNOSIS — Z8719 Personal history of other diseases of the digestive system: Secondary | ICD-10-CM | POA: Insufficient documentation

## 2015-03-05 DIAGNOSIS — Z8639 Personal history of other endocrine, nutritional and metabolic disease: Secondary | ICD-10-CM | POA: Diagnosis not present

## 2015-03-05 DIAGNOSIS — R42 Dizziness and giddiness: Secondary | ICD-10-CM | POA: Diagnosis not present

## 2015-03-05 DIAGNOSIS — Z8669 Personal history of other diseases of the nervous system and sense organs: Secondary | ICD-10-CM | POA: Diagnosis not present

## 2015-03-05 DIAGNOSIS — F419 Anxiety disorder, unspecified: Secondary | ICD-10-CM | POA: Diagnosis not present

## 2015-03-05 DIAGNOSIS — R109 Unspecified abdominal pain: Secondary | ICD-10-CM | POA: Diagnosis not present

## 2015-03-05 DIAGNOSIS — T50905A Adverse effect of unspecified drugs, medicaments and biological substances, initial encounter: Secondary | ICD-10-CM | POA: Diagnosis not present

## 2015-03-05 DIAGNOSIS — R030 Elevated blood-pressure reading, without diagnosis of hypertension: Secondary | ICD-10-CM | POA: Diagnosis not present

## 2015-03-05 DIAGNOSIS — I10 Essential (primary) hypertension: Secondary | ICD-10-CM

## 2015-03-05 DIAGNOSIS — R079 Chest pain, unspecified: Secondary | ICD-10-CM | POA: Diagnosis not present

## 2015-03-05 DIAGNOSIS — R0789 Other chest pain: Secondary | ICD-10-CM | POA: Diagnosis not present

## 2015-03-05 DIAGNOSIS — K0889 Other specified disorders of teeth and supporting structures: Secondary | ICD-10-CM | POA: Diagnosis not present

## 2015-03-05 LAB — BASIC METABOLIC PANEL
ANION GAP: 8 (ref 5–15)
BUN: 7 mg/dL (ref 6–20)
CALCIUM: 8.6 mg/dL — AB (ref 8.9–10.3)
CHLORIDE: 99 mmol/L — AB (ref 101–111)
CO2: 27 mmol/L (ref 22–32)
Creatinine, Ser: 1.38 mg/dL — ABNORMAL HIGH (ref 0.61–1.24)
GFR calc non Af Amer: >60 mL/min (ref >60)
Glucose, Bld: 126 mg/dL — ABNORMAL HIGH (ref 65–99)
POTASSIUM: 3.3 mmol/L — AB (ref 3.5–5.1)
SODIUM: 134 mmol/L — AB (ref 135–145)

## 2015-03-05 LAB — CBC WITH DIFFERENTIAL/PLATELET
BASOS ABS: 0 10*3/uL (ref 0.0–0.1)
Basophils Relative: 0 %
EOS ABS: 0.1 10*3/uL (ref 0.0–0.7)
EOS PCT: 2 %
HCT: 46.8 % (ref 39.0–52.0)
HEMOGLOBIN: 16.6 g/dL (ref 13.0–17.0)
LYMPHS ABS: 2.7 10*3/uL (ref 0.7–4.0)
Lymphocytes Relative: 49 %
MCH: 29.5 pg (ref 26.0–34.0)
MCHC: 35.5 g/dL (ref 30.0–36.0)
MCV: 83.1 fL (ref 78.0–100.0)
Monocytes Absolute: 0.4 10*3/uL (ref 0.1–1.0)
Monocytes Relative: 7 %
NEUTROS PCT: 42 %
Neutro Abs: 2.3 10*3/uL (ref 1.7–7.7)
PLATELETS: 310 10*3/uL (ref 150–400)
RBC: 5.63 MIL/uL (ref 4.22–5.81)
RDW: 13.2 % (ref 11.5–15.5)
WBC: 5.4 10*3/uL (ref 4.0–10.5)

## 2015-03-05 MED ORDER — SODIUM CHLORIDE 0.9 % IV BOLUS (SEPSIS)
1000.0000 mL | Freq: Once | INTRAVENOUS | Status: AC
  Administered 2015-03-06: 1000 mL via INTRAVENOUS

## 2015-03-05 MED ORDER — PENICILLIN V POTASSIUM 250 MG PO TABS
500.0000 mg | ORAL_TABLET | Freq: Once | ORAL | Status: AC
  Administered 2015-03-06: 500 mg via ORAL
  Filled 2015-03-05: qty 2

## 2015-03-05 MED ORDER — ASPIRIN 81 MG PO CHEW
324.0000 mg | CHEWABLE_TABLET | Freq: Once | ORAL | Status: AC
  Administered 2015-03-06: 324 mg via ORAL
  Filled 2015-03-05: qty 4

## 2015-03-05 MED ORDER — AMLODIPINE BESYLATE 5 MG PO TABS
5.0000 mg | ORAL_TABLET | Freq: Once | ORAL | Status: AC
  Administered 2015-03-06: 5 mg via ORAL
  Filled 2015-03-05: qty 1

## 2015-03-05 NOTE — ED Notes (Signed)
Initial BP was 185/137 and rechecked shortly after and was 176/138

## 2015-03-05 NOTE — ED Notes (Signed)
Pt. arrived with EMS from home reports left upper dental pain for 2 days took Vicodin this evening with no relief , pt. also reported " I don't feel good "  - appeared anxious and hypertensive at arrival . Respirations unlabored .

## 2015-03-05 NOTE — ED Provider Notes (Signed)
By signing my name below, I, Doreatha Martin, attest that this documentation has been prepared under the direction and in the presence of Sahiti Joswick N Geralene Afshar, DO. Electronically Signed: Doreatha Martin, ED Scribe. 03/05/2015. 11:48 PM.   TIME SEEN: 11:38 PM   CHIEF COMPLAINT:  Chief Complaint  Patient presents with  . Dental Pain  . Anxiety     HPI:  HPI Comments: Kenneth Robertson is a 34 y.o. male with h/o GERD, HTN (not currently medicated), HLD who presents to the Emergency Department complaining of an adverse reaction to Hydrocodone onset 2 hours ago. Pt states he took Hydrocodone earlier this evening for his right upper dental pain, onset 3 days ago, and began to feel chest tightness, abdominal pain, SOB, dizziness, lightheadedness. No h/o similar reactions to pain medication. He reports current resolution of his chest tightness, SOB and dizziness. States he thinks he may have had a panic attack. Pt is followed by a dentist who has seen him for his dental pain and prescribed him Hydrocodone. Takes his symptoms started after he took Vicodin. He states he was not put on antibiotics.  Pt is followed by [facility] for primary care. He is not sure of what BP medication he is normally prescribed. He does not remember the name of this medication. No h/o DM. Pt is a non-smoker. He denies nausea, vomiting, numbness, paresthesia, HA. No vision changes.  ROS: See HPI Constitutional: no fever  Eyes: no drainage.  ENT: no runny nose. Positive for dental pain Cardiovascular: No chest pain. Positive for transient chest tightness  Resp: Positive for SOB  GI: no vomiting. Positive for abdominal pain GU: no dysuria Integumentary: no rash  Allergy: no hives  Musculoskeletal: no leg swelling  Neurological: no slurred speech. Positive for lightheadedness, transient dizziness ROS otherwise negative  PAST MEDICAL HISTORY/PAST SURGICAL HISTORY:  Past Medical History  Diagnosis Date  . Depression   . Visual  impairment   . Hypertension     MEDICATIONS:  Prior to Admission medications   Medication Sig Start Date End Date Taking? Authorizing Provider  ibuprofen (ADVIL,MOTRIN) 600 MG tablet Take 1 tablet (600 mg total) by mouth every 6 (six) hours as needed. 02/12/13   Benjiman Core, MD  traMADol (ULTRAM) 50 MG tablet Take 1 tablet (50 mg total) by mouth every 6 (six) hours as needed. Patient not taking: Reported on 05/06/2014 02/12/13   Benjiman Core, MD    ALLERGIES:  Allergies  Allergen Reactions  . Hydrocodone Nausea Only    SOCIAL HISTORY:  Social History  Substance Use Topics  . Smoking status: Never Smoker   . Smokeless tobacco: Not on file  . Alcohol Use: No     Comment: socially    FAMILY HISTORY: Family History  Problem Relation Age of Onset  . Asthma Mother   . Cancer Father     EXAM: BP 139/102 mmHg  Pulse 84  Temp(Src) 97.7 F (36.5 C) (Oral)  Resp 20  SpO2 99% CONSTITUTIONAL: Alert and oriented and responds appropriately to questions. Well-appearing; well-nourished HEAD: Normocephalic EYES: Conjunctivae clear, PERRL ENT: normal nose; no rhinorrhea; moist mucous membranes; pharynx without lesions noted; no tonsillar hypertrophy or exudate, no uvular deviation, no trismus or drooling, normal phonation, no stridor, no dental abscess noted, a she does have a partial in his upper mouth which is able to remove. He has pain over the left upper second premolar with associated dental cavity; no Ludwig's angina, tongue sits flat in the bottom of the  mouth NECK: Supple, no meningismus, no LAD  CARD: RRR; S1 and S2 appreciated; no murmurs, no clicks, no rubs, no gallops RESP: Normal chest excursion without splinting or tachypnea; breath sounds clear and equal bilaterally; no wheezes, no rhonchi, no rales, no hypoxia or respiratory distress, speaking full sentences ABD/GI: Normal bowel sounds; non-distended; soft, non-tender, no rebound, no guarding BACK:  The back  appears normal and is non-tender to palpation, there is no CVA tenderness EXT: Normal ROM in all joints; non-tender to palpation; no edema; normal capillary refill; no cyanosis    SKIN: Normal color for age and race; warm NEURO: Moves all extremities equally, sensation to light touch intact diffusely, cranial nerves II through XII intact PSYCH: The patient's mood and manner are appropriate. Grooming and personal hygiene are appropriate.  MEDICAL DECISION MAKING: Patient here with episode of chest pain, shortness of breath and dizziness. Was found to be extremely hypertensive with EMS. Patient reports he does have a history of hypertension but has been off of blood pressure medication for several months. Does not remember the name of his medication. States pain in all of his symptoms started after taking a Vicodin. States he thinks this may have been a panic attack. Will start patient on Norvasc for his blood pressure and obtain labs, EKG, chest x-ray given he was symptomatic. No sign of any dental abscess on exam that is drainable. No Ludwig's angina. Will start on prophylactic penicillin. He does have a dentist for follow-up.  ED PROGRESS: Patient's labs unremarkable other than a mildly elevated creatinine of 1.38. He has received a liter of IV fluids in the emergency department and have discussed this finding with patient have recommend close outpatient follow-up. He states now that he thinks he was previously on lisinopril. Given this acute renal failure we'll hold lisinopril and start him on Norvasc 5 mg. Given first dose in the emergency department. He has had 2 negative troponins. His blood pressure has improved. He is asymptomatic. No hematuria, proteinuria. No neurologic deficits, headache, vision changes. No current chest pain. This case with patient that this may benefit him contact but they have also been hypertensive urgency. Given his blood pressure improved without intervention and continues to  do well and he is not asymptomatic I feel he is safe to be discharged home with close outpatient follow-up. Discussed return precautions. He verbalizes understanding is controlled well with this plan.    EKG Interpretation  Date/Time:  Wednesday March 05 2015 23:37:05 EST Ventricular Rate:  77 PR Interval:  165 QRS Duration: 111 QT Interval:  394 QTC Calculation: 446 R Axis:   -16 Text Interpretation:  Sinus rhythm Probable left ventricular hypertrophy No significant change since last tracing Confirmed by Aiden Helzer,  DO, Jenice Leiner [phone]) on 03/05/2015 11:43:56 PM       I personally performed the services described in this documentation, which was scribed in my presence. The recorded information has been reviewed and is accurate.     Layla Maw Adonia Porada, DO 03/06/15 0830

## 2015-03-05 NOTE — ED Notes (Signed)
MD at bedside. 

## 2015-03-06 DIAGNOSIS — K029 Dental caries, unspecified: Secondary | ICD-10-CM | POA: Diagnosis not present

## 2015-03-06 LAB — URINALYSIS, ROUTINE W REFLEX MICROSCOPIC
BILIRUBIN URINE: NEGATIVE
GLUCOSE, UA: NEGATIVE mg/dL
HGB URINE DIPSTICK: NEGATIVE
KETONES UR: NEGATIVE mg/dL
Leukocytes, UA: NEGATIVE
Nitrite: NEGATIVE
PH: 6.5 (ref 5.0–8.0)
Protein, ur: NEGATIVE mg/dL
SPECIFIC GRAVITY, URINE: 1.009 (ref 1.005–1.030)

## 2015-03-06 LAB — I-STAT TROPONIN, ED: Troponin i, poc: 0.01 ng/mL (ref 0.00–0.08)

## 2015-03-06 LAB — TROPONIN I: Troponin I: <0.03 ng/mL (ref <0.031)

## 2015-03-06 MED ORDER — AMLODIPINE BESYLATE 5 MG PO TABS: 5.0000 mg | ORAL_TABLET | Freq: Every day | ORAL | Status: DC

## 2015-03-06 MED ORDER — PENICILLIN V POTASSIUM 500 MG PO TABS: 500.0000 mg | ORAL_TABLET | Freq: Four times a day (QID) | ORAL | Status: DC

## 2015-03-06 NOTE — Discharge Instructions (Signed)
Your kidney function also known as your creatinine was slightly elevated today at 1.38. Kachel's avoid ibuprofen, aspirin, Aleve. I recommend you increase your water intake. You will need to have this rechecked by your primary care doctor in 1-2 weeks.   Dental Caries Dental caries (also called tooth decay) is the most common oral disease. It can occur at any age but is more common in children and young adults.  HOW DENTAL CARIES DEVELOPS  The process of decay begins when bacteria and foods (particularly sugars and starches) combine in your mouth to produce plaque. Plaque is a substance that sticks to the hard, outer surface of a tooth (enamel). The bacteria in plaque produce acids that attack enamel. These acids may also attack the root surface of a tooth (cementum) if it is exposed. Repeated attacks dissolve these surfaces and create holes in the tooth (cavities). If left untreated, the acids destroy the other layers of the tooth.  RISK FACTORS  Frequent sipping of sugary beverages.   Frequent snacking on sugary and starchy foods, especially those that easily get stuck in the teeth.   Poor oral hygiene.   Dry mouth.   Substance abuse such as methamphetamine abuse.   Broken or poor-fitting dental restorations.   Eating disorders.   Gastroesophageal reflux disease (GERD).   Certain radiation treatments to the head and neck. SYMPTOMS In the early stages of dental caries, symptoms are seldom present. Sometimes white, chalky areas may be seen on the enamel or other tooth layers. In later stages, symptoms may include:  Pits and holes on the enamel.  Toothache after sweet, hot, or cold foods or drinks are consumed.  Pain around the tooth.  Swelling around the tooth. DIAGNOSIS  Most of the time, dental caries is detected during a regular dental checkup. A diagnosis is made after a thorough medical and dental history is taken and the surfaces of your teeth are checked for signs of  dental caries. Sometimes special instruments, such as lasers, are used to check for dental caries. Dental X-ray exams may be taken so that areas not visible to the eye (such as between the contact areas of the teeth) can be checked for cavities.  TREATMENT  If dental caries is in its early stages, it may be reversed with a fluoride treatment or an application of a remineralizing agent at the dental office. Thorough brushing and flossing at home is needed to aid these treatments. If it is in its later stages, treatment depends on the location and extent of tooth destruction:   If a small area of the tooth has been destroyed, the destroyed area will be removed and cavities will be filled with a material such as gold, silver amalgam, or composite resin.   If a large area of the tooth has been destroyed, the destroyed area will be removed and a cap (crown) will be fitted over the remaining tooth structure.   If the center part of the tooth (pulp) is affected, a procedure called a root canal will be needed before a filling or crown can be placed.   If most of the tooth has been destroyed, the tooth may need to be pulled (extracted). HOME CARE INSTRUCTIONS You can prevent, stop, or reverse dental caries at home by practicing good oral hygiene. Good oral hygiene includes:  Thoroughly cleaning your teeth at least twice a day with a toothbrush and dental floss.   Using a fluoride toothpaste. A fluoride mouth rinse may also be used if  recommended by your dentist or health care provider.   Restricting the amount of sugary and starchy foods and sugary liquids you consume.   Avoiding frequent snacking on these foods and sipping of these liquids.   Keeping regular visits with a dentist for checkups and cleanings. PREVENTION   Practice good oral hygiene.  Consider a dental sealant. A dental sealant is a coating material that is applied by your dentist to the pits and grooves of teeth. The sealant  prevents food from being trapped in them. It may protect the teeth for several years.  Ask about fluoride supplements if you live in a community without fluorinated water or with water that has a low fluoride content. Use fluoride supplements as directed by your dentist or health care provider.  Allow fluoride varnish applications to teeth if directed by your dentist or health care provider.   This information is not intended to replace advice given to you by your health care provider. Make sure you discuss any questions you have with your health care provider.   Document Released: 12/12/2001 Document Revised: 04/12/2014 Document Reviewed: 03/24/2012 Elsevier Interactive Patient Education 2016 [employer].  Hypertension Hypertension, commonly called high blood pressure, is when the force of blood pumping through your arteries is too strong. Your arteries are the blood vessels that carry blood from your heart throughout your body. A blood pressure reading consists of a higher number over a lower number, such as 110/72. The higher number (systolic) is the pressure inside your arteries when your heart pumps. The lower number (diastolic) is the pressure inside your arteries when your heart relaxes. Ideally you want your blood pressure below 120/80. Hypertension forces your heart to work harder to pump blood. Your arteries may become narrow or stiff. Having untreated or uncontrolled hypertension can cause heart attack, stroke, kidney disease, and other problems. RISK FACTORS Some risk factors for high blood pressure are controllable. Others are not.  Risk factors you cannot control include:   Race. You may be at higher risk if you are African American.  Age. Risk increases with age.  Gender. Men are at higher risk than women before age 10 years. After age 75, women are at higher risk than men. Risk factors you can control include:  Not getting enough exercise or physical activity.  Being  overweight.  Getting too much fat, sugar, calories, or salt in your diet.  Drinking too much alcohol. SIGNS AND SYMPTOMS Hypertension does not usually cause signs or symptoms. Extremely high blood pressure (hypertensive crisis) may cause headache, anxiety, shortness of breath, and nosebleed. DIAGNOSIS To check if you have hypertension, your health care provider will measure your blood pressure while you are seated, with your arm held at the level of your heart. It should be measured at least twice using the same arm. Certain conditions can cause a difference in blood pressure between your right and left arms. A blood pressure reading that is higher than normal on one occasion does not mean that you need treatment. If it is not clear whether you have high blood pressure, you may be asked to return on a different day to have your blood pressure checked again. Or, you may be asked to monitor your blood pressure at home for 1 or more weeks. TREATMENT Treating high blood pressure includes making lifestyle changes and possibly taking medicine. Living a healthy lifestyle can help lower high blood pressure. You may need to change some of your habits. Lifestyle changes may include:  Following the DASH diet. This diet is high in fruits, vegetables, and whole grains. It is low in salt, red meat, and added sugars.  Keep your sodium intake below 2,300 mg per day.  Getting at least 30-45 minutes of aerobic exercise at least 4 times per week.  Losing weight if necessary.  Not smoking.  Limiting alcoholic beverages.  Learning ways to reduce stress. Your health care provider may prescribe medicine if lifestyle changes are not enough to get your blood pressure under control, and if one of the following is true:  You are 48-14 years of age and your systolic blood pressure is above 140.  You are 61 years of age or older, and your systolic blood pressure is above 150.  Your diastolic blood pressure is  above 90.  You have diabetes, and your systolic blood pressure is over 140 or your diastolic blood pressure is over 90.  You have kidney disease and your blood pressure is above 140/90.  You have heart disease and your blood pressure is above 140/90. Your personal target blood pressure may vary depending on your medical conditions, your age, and other factors. HOME CARE INSTRUCTIONS  Have your blood pressure rechecked as directed by your health care provider.   Take medicines only as directed by your health care provider. Follow the directions carefully. Blood pressure medicines must be taken as prescribed. The medicine does not work as well when you skip doses. Skipping doses also puts you at risk for problems.  Do not smoke.   Monitor your blood pressure at home as directed by your health care provider. SEEK MEDICAL CARE IF:   You think you are having a reaction to medicines taken.  You have recurrent headaches or feel dizzy.  You have swelling in your ankles.  You have trouble with your vision. SEEK IMMEDIATE MEDICAL CARE IF:  You develop a severe headache or confusion.  You have unusual weakness, numbness, or feel faint.  You have severe chest or abdominal pain.  You vomit repeatedly.  You have trouble breathing. MAKE SURE YOU:   Understand these instructions.  Will watch your condition.  Will get help right away if you are not doing well or get worse.   This information is not intended to replace advice given to you by your health care provider. Make sure you discuss any questions you have with your health care provider.   Document Released: 03/22/2005 Document Revised: 08/06/2014 Document Reviewed: 01/12/2013 Elsevier Interactive Patient Education 2016 Elsevier Inc[employer]  Nonspecific Chest Pain It is often hard to find the cause of chest pain. There is always a chance that your pain could be related to something serious, such as a heart attack or a blood clot  in your lungs. Chest pain can also be caused by conditions that are not life-threatening. If you have chest pain, it is very important to follow up with your doctor.  HOME CARE  If you were prescribed an antibiotic medicine, finish it all even if you start to feel better.  Avoid any activities that cause chest pain.  Do not use any tobacco products, including cigarettes, chewing tobacco, or electronic cigarettes. If you need help quitting, ask your doctor.  Do not drink alcohol.  Take medicines only as told by your doctor.  Keep all follow-up visits as told by your doctor. This is important. This includes any further testing if your chest pain does not go away.  Your doctor may tell you to keep your head  raised (elevated) while you sleep.  Make lifestyle changes as told by your doctor. These may include:  Getting regular exercise. Ask your doctor to suggest some activities that are safe for you.  Eating a heart-healthy diet. Your doctor or a diet specialist (dietitian) can help you to learn healthy eating options.  Maintaining a healthy weight.  Managing diabetes, if necessary.  Reducing stress. GET HELP IF:  Your chest pain does not go away, even after treatment.  You have a rash with blisters on your chest.  You have a fever. GET HELP RIGHT AWAY IF:  Your chest pain is worse.  You have an increasing cough, or you cough up blood.  You have severe belly (abdominal) pain.  You feel extremely weak.  You pass out (faint).  You have chills.  You have sudden, unexplained chest discomfort.  You have sudden, unexplained discomfort in your arms, back, neck, or jaw.  You have shortness of breath at any time.  You suddenly start to sweat, or your skin gets clammy.  You feel nauseous.  You vomit.  You suddenly feel light-headed or dizzy.  Your heart begins to beat quickly, or it feels like it is skipping beats. These symptoms may be an emergency. Do not wait to  see if the symptoms will go away. Get medical help right away. Call your local emergency services (911 in the [place] Do not drive yourself to the hospital.   This information is not intended to replace advice given to you by your health care provider. Make sure you discuss any questions you have with your health care provider.   Document Released: 09/08/2007 Document Revised: 04/12/2014 Document Reviewed: 10/26/2013 Elsevier Interactive Patient Education [employer]

## 2015-03-18 ENCOUNTER — Encounter (HOSPITAL_COMMUNITY): Payer: Self-pay

## 2015-03-18 ENCOUNTER — Emergency Department (HOSPITAL_COMMUNITY)
Admission: EM | Admit: 2015-03-18 | Discharge: 2015-03-18 | Disposition: A | Discharge status: Home / Self Care | Payer: Medicare Other | Attending: Emergency Medicine | Admitting: Emergency Medicine

## 2015-03-18 DIAGNOSIS — R0981 Nasal congestion: Secondary | ICD-10-CM | POA: Diagnosis present

## 2015-03-18 DIAGNOSIS — I10 Essential (primary) hypertension: Secondary | ICD-10-CM | POA: Insufficient documentation

## 2015-03-18 DIAGNOSIS — Z79899 Other long term (current) drug therapy: Secondary | ICD-10-CM | POA: Diagnosis not present

## 2015-03-18 DIAGNOSIS — J029 Acute pharyngitis, unspecified: Secondary | ICD-10-CM | POA: Insufficient documentation

## 2015-03-18 DIAGNOSIS — Z8659 Personal history of other mental and behavioral disorders: Secondary | ICD-10-CM | POA: Diagnosis not present

## 2015-03-18 LAB — RAPID STREP SCREEN (MED CTR MEBANE ONLY): Streptococcus, Group A Screen (Direct): NEGATIVE

## 2015-03-18 NOTE — ED Provider Notes (Signed)
CSN: [id number]     Arrival date & time 03/18/15  1324 History   First MD Initiated Contact with Patient 03/18/15 [phone]     Chief Complaint  Patient presents with  . Sore Throat  . Nasal Congestion     (Consider location/radiation/quality/duration/timing/severity/associated sxs/prior Treatment) HPI Kenneth Robertson is a 34 y.o. male who comes in for evaluation of sore throat and nasal congestion. Patient reports he was kissing someone that told him they had strep throat and he began to experience sore throat symptoms starting yesterday. He reports associated nasal congestion. He reports a subjective fever last night, unmeasured. He denies any other chest pain, shortness of breath, hemoptysis, cough. He has tried Robitussin and Tylenol without relief of his symptoms. Swallowing worsens his discomfort No other modifying factors.  Past Medical History  Diagnosis Date  . Depression   . Visual impairment   . Hypertension    Past Surgical History  Procedure Laterality Date  . Tooth extraction    . Eye surgery      Left    Family History  Problem Relation Age of Onset  . Asthma Mother   . Cancer Father    Social History  Substance Use Topics  . Smoking status: Never Smoker   . Smokeless tobacco: None  . Alcohol Use: No     Comment: socially    Review of Systems A 10 point review of systems was completed and was negative except for pertinent positives and negatives as mentioned in the history of present illness     Allergies  Hydrocodone  Home Medications   Prior to Admission medications   Medication Sig Start Date End Date Taking? Authorizing Provider  amLODipine (NORVASC) 5 MG tablet Take 1 tablet (5 mg total) by mouth daily. 03/06/15  Yes Kristen N Ward, DO  penicillin v potassium (VEETID) 500 MG tablet Take 1 tablet (500 mg total) by mouth 4 (four) times daily. 03/06/15  Yes Kristen N Ward, DO   BP 128/99 mmHg  Pulse 84  Temp(Src) 98.2 F (36.8 C) (Oral)  Resp 23   SpO2 96% Physical Exam  Constitutional:  Awake, alert, nontoxic appearance.  HENT:  Head: Atraumatic.  Mildly erythematous posterior oropharynx. No unilateral tonsillar swelling, uvular deviation, trismus, glossal elevation. No evidence of abscess.  Eyes: Right eye exhibits no discharge. Left eye exhibits no discharge.  Neck: Normal range of motion. Neck supple.  Cardiovascular: Normal rate, regular rhythm and normal heart sounds.   Pulmonary/Chest: Effort normal. He exhibits no tenderness.  Abdominal: Soft. There is no tenderness. There is no rebound.  No splenomegaly  Musculoskeletal: He exhibits no tenderness.  Baseline ROM, no obvious new focal weakness.  Lymphadenopathy:    He has no cervical adenopathy.  Neurological:  Mental status and motor strength appears baseline for patient and situation.  Skin: No rash noted.  Psychiatric: He has a normal mood and affect.  Nursing note and vitals reviewed.   ED Course  Procedures (including critical care time) Labs Review Labs Reviewed  RAPID STREP SCREEN (NOT AT [facility])  CULTURE, GROUP A STREP    Imaging Review No results found. I have personally reviewed and evaluated these images and lab results as part of my medical decision-making.   EKG Interpretation   Date/Time:  Tuesday March 18 2015 09:36:58 EST Ventricular Rate:  83 PR Interval:  158 QRS Duration: 104 QT Interval:  387 QTC Calculation: 455 R Axis:   13 Text Interpretation:  Sinus rhythm Borderline T  wave abnormalities  Confirmed by Lincoln Brigham [phone]) on 03/18/2015 9:40:00 AM     Meds given in ED:  Medications - No data to display  New Prescriptions   No medications on file   Filed Vitals:   03/18/15 1130 03/18/15 1135 03/18/15 1200 03/18/15 1215  BP:  138/103 128/99   Pulse: 81 82 80 84  Temp:      TempSrc:      Resp: 23 22 23 23   SpO2: 95% 96% 96% 96%    MDM  Kenneth Robertson is a 34 y.o. male presents for evaluation of sore throat and nasal  congestion. Patient has low Centor criteria score, however he admits to kissing someone with strep throat recently. Obtain rapid strep which was negative. Symptoms likely viral in nature. Encourage symptomatic support at home as well as follow-up with his doctor next week for reevaluation. Overall, patient appears well, nontoxic, hemodynamically stable and appropriate for discharge  The patient appears reasonably screened and/or stabilized for discharge and I doubt any other medical condition or other [facility] requiring further screening, evaluation, or treatment in the ED at this time prior to discharge.   Final diagnoses:  Sore throat  Nasal congestion       Joycie Peek, PA-C 03/18/15 1451  Tilden Fossa, MD 03/19/15 [phone]

## 2015-03-18 NOTE — ED Notes (Signed)
Pt c/o URI x 1 day - sore throat, nasal congestion, painful to swallow. NAD. Resp e/u.

## 2015-03-18 NOTE — Discharge Instructions (Signed)
That does not appear to be an emergent cause for your symptoms at this time. Her strep test was negative. Her sore throat is likely due to a virus. Please follow-up with your doctor next week for reevaluation if your symptoms do not improve. Return to ED for any new or worsening symptoms.  Sore Throat A sore throat is pain, burning, irritation, or scratchiness of the throat. There is often pain or tenderness when swallowing or talking. A sore throat may be accompanied by other symptoms, such as coughing, sneezing, fever, and swollen neck glands. A sore throat is often the first sign of another sickness, such as a cold, flu, strep throat, or mononucleosis (commonly known as mono). Most sore throats go away without medical treatment. CAUSES  The most common causes of a sore throat include:  A viral infection, such as a cold, flu, or mono.  A bacterial infection, such as strep throat, tonsillitis, or whooping cough.  Seasonal allergies.  Dryness in the air.  Irritants, such as smoke or pollution.  Gastroesophageal reflux disease (GERD). HOME CARE INSTRUCTIONS   Only take over-the-counter medicines as directed by your caregiver.  Drink enough fluids to keep your urine clear or pale yellow.  Rest as needed.  Try using throat sprays, lozenges, or sucking on hard candy to ease any pain (if older than 4 years or as directed).  Sip warm liquids, such as broth, herbal tea, or warm water with honey to relieve pain temporarily. You may also eat or drink cold or frozen liquids such as frozen ice pops.  Gargle with salt water (mix 1 tsp salt with 8 oz of water).  Do not smoke and avoid secondhand smoke.  Put a cool-mist humidifier in your bedroom at night to moisten the air. You can also turn on a hot shower and sit in the bathroom with the door closed for 5-10 minutes. SEEK IMMEDIATE MEDICAL CARE IF:  You have difficulty breathing.  You are unable to swallow fluids, soft foods, or your  saliva.  You have increased swelling in the throat.  Your sore throat does not get better in 7 days.  You have nausea and vomiting.  You have a fever or persistent symptoms for more than 2-3 days.  You have a fever and your symptoms suddenly get worse. MAKE SURE YOU:   Understand these instructions.  Will watch your condition.  Will get help right away if you are not doing well or get worse.   This information is not intended to replace advice given to you by your health care provider. Make sure you discuss any questions you have with your health care provider.   Document Released: 04/29/2004 Document Revised: 04/12/2014 Document Reviewed: 11/28/2011 Elsevier Interactive Patient Education [employer]

## 2015-03-20 LAB — CULTURE, GROUP A STREP: STREP A CULTURE: NEGATIVE

## 2015-03-22 ENCOUNTER — Encounter (HOSPITAL_COMMUNITY): Payer: Self-pay | Admitting: Emergency Medicine

## 2015-03-22 ENCOUNTER — Emergency Department (HOSPITAL_COMMUNITY)
Admission: EM | Admit: 2015-03-22 | Discharge: 2015-03-22 | Disposition: A | Discharge status: Home / Self Care | Payer: Medicare Other | Attending: Emergency Medicine | Admitting: Emergency Medicine

## 2015-03-22 DIAGNOSIS — F329 Major depressive disorder, single episode, unspecified: Secondary | ICD-10-CM | POA: Insufficient documentation

## 2015-03-22 DIAGNOSIS — Z79899 Other long term (current) drug therapy: Secondary | ICD-10-CM | POA: Insufficient documentation

## 2015-03-22 DIAGNOSIS — J029 Acute pharyngitis, unspecified: Secondary | ICD-10-CM | POA: Insufficient documentation

## 2015-03-22 DIAGNOSIS — I10 Essential (primary) hypertension: Secondary | ICD-10-CM | POA: Insufficient documentation

## 2015-03-22 MED ORDER — CHLORHEXIDINE GLUCONATE 0.12 % MT SOLN: 15.0000 mL | Freq: Two times a day (BID) | OROMUCOSAL | Status: DC

## 2015-03-22 MED ORDER — GUAIFENESIN 100 MG/5ML PO LIQD: 100.0000 mg | ORAL | Status: DC | PRN

## 2015-03-22 MED ORDER — DEXAMETHASONE SODIUM PHOSPHATE 10 MG/ML IJ SOLN
10.0000 mg | Freq: Once | INTRAMUSCULAR | Status: AC
  Administered 2015-03-22: 10 mg via INTRAMUSCULAR
  Filled 2015-03-22: qty 1

## 2015-03-22 NOTE — ED Provider Notes (Signed)
CSN: [id number]     Arrival date & time 03/22/15  1111 History  By signing my name below, I, Soijett Blue, attest that this documentation has been prepared under the direction and in the presence of Fayrene Helper, PA-C Electronically Signed: Soijett Blue, ED Scribe. 03/22/2015. 2:20 PM.   Chief Complaint  Patient presents with  . Sore Throat      The history is provided by the patient. No language interpreter was used.    HPI Comments: Kenneth Robertson is a 34 y.o. male who presents to the Emergency Department complaining of sharp, progressively worsening, sore throat onset 6 days. He reports that he has a positive sick contact of someone who he kissed and that is what prompted him to come to the ED on 03/18/2015. He states that he is now concerned for if there is something else that may be the cause of his symptoms. He notes that he was seen on 03/18/2015 and had rapid strep and culture completed that returned negative. He states that he is having associated symptoms of nasal congestion x 6 days, productive cough, and eye discharge. He states that he has tried alka-seltzer, advil, warm saltwater gargle with no relief for his symptoms. He denies eye pain, fever, and any other symptoms.    Past Medical History  Diagnosis Date  . Depression   . Visual impairment   . Hypertension    Past Surgical History  Procedure Laterality Date  . Tooth extraction    . Eye surgery      Left    Family History  Problem Relation Age of Onset  . Asthma Mother   . Cancer Father    Social History  Substance Use Topics  . Smoking status: Never Smoker   . Smokeless tobacco: None  . Alcohol Use: No     Comment: socially    Review of Systems  Constitutional: Negative for fever.  HENT: Positive for congestion and sore throat.   Eyes: Positive for discharge. Negative for pain.  Respiratory: Positive for cough.      Allergies  Hydrocodone  Home Medications   Prior to Admission medications    Medication Sig Start Date End Date Taking? Authorizing Provider  amLODipine (NORVASC) 5 MG tablet Take 1 tablet (5 mg total) by mouth daily. 03/06/15   Kristen N Ward, DO  penicillin v potassium (VEETID) 500 MG tablet Take 1 tablet (500 mg total) by mouth 4 (four) times daily. 03/06/15   Kristen N Ward, DO   BP 151/108 mmHg  Pulse 95  Temp(Src) 99.1 F (37.3 C) (Oral)  Resp 18  Ht 5\' 9"  (1.753 m)  Wt 240 lb (108.863 kg)  BMI 35.43 kg/m2  SpO2 96% Physical Exam  Constitutional: He is oriented to person, place, and time. He appears well-developed and well-nourished. No distress.  HENT:  Head: Normocephalic and atraumatic.  Mouth/Throat: Uvula is midline. No trismus in the jaw. No oropharyngeal exudate.  Mild tonsillar enlargement without exudate. Postnasal drip noted. No trismus.   Eyes: EOM are normal.  Neck: Neck supple.  Cardiovascular: Normal rate, regular rhythm and normal heart sounds.  Exam reveals no gallop and no friction rub.   No murmur heard. Pulmonary/Chest: Effort normal and breath sounds normal. No respiratory distress. He has no wheezes. He has no rales.  Musculoskeletal: Normal range of motion.  Neurological: He is alert and oriented to person, place, and time.  Skin: Skin is warm and dry.  Psychiatric: He has a normal mood  and affect. His behavior is normal.  Nursing note and vitals reviewed.   ED Course  Procedures (including critical care time) DIAGNOSTIC STUDIES: Oxygen Saturation is 96% on RA, nl by my interpretation.    COORDINATION OF CARE: 2:19 PM-I suspect that this is a probable viral infection. Low suspicion for strep throat or postpharyngeal abscess at this time. Recent rapid strep test and throat culture are unremarkable.  Will treat the pt with mouthwash Rx and prednisone injection. Discussed treatment plan with pt at bedside which includes referral to ENT specialist and pt agreed to plan.    MDM   Final diagnoses:  Pharyngitis    BP 151/108  mmHg  Pulse 95  Temp(Src) 99.1 F (37.3 C) (Oral)  Resp 18  Ht  (1.753 m)  Wt 108.863 kg  BMI 35.43 kg/m2  SpO2 96%   I personally performed the services described in this documentation, which was scribed in my presence. The recorded information has been reviewed and is accurate.      Fayrene Helper, PA-C 03/22/15 1436  Rolland Porter, MD 03/23/15 [phone]

## 2015-03-22 NOTE — Discharge Instructions (Signed)

## 2015-03-22 NOTE — ED Notes (Signed)
Pt sts sore throat and nasal congestion x 6 days with some fever last night

## 2015-03-22 NOTE — ED Notes (Signed)
PT has not taken his BP meds to day.

## 2015-03-22 NOTE — ED Notes (Signed)
Declined W/C at D/C and was escorted to lobby by RN. 

## 2015-08-07 DIAGNOSIS — E669 Obesity, unspecified: Secondary | ICD-10-CM | POA: Diagnosis not present

## 2015-08-07 DIAGNOSIS — R7303 Prediabetes: Secondary | ICD-10-CM | POA: Diagnosis not present

## 2015-08-07 DIAGNOSIS — F321 Major depressive disorder, single episode, moderate: Secondary | ICD-10-CM | POA: Diagnosis not present

## 2015-08-07 DIAGNOSIS — L219 Seborrheic dermatitis, unspecified: Secondary | ICD-10-CM | POA: Diagnosis not present

## 2015-08-07 DIAGNOSIS — Z6836 Body mass index (BMI) 36.0-36.9, adult: Secondary | ICD-10-CM | POA: Diagnosis not present

## 2015-08-07 DIAGNOSIS — Z113 Encounter for screening for infections with a predominantly sexual mode of transmission: Secondary | ICD-10-CM | POA: Diagnosis not present

## 2015-08-07 DIAGNOSIS — Z13220 Encounter for screening for lipoid disorders: Secondary | ICD-10-CM | POA: Diagnosis not present

## 2015-08-07 DIAGNOSIS — K5900 Constipation, unspecified: Secondary | ICD-10-CM | POA: Diagnosis not present

## 2015-08-18 DIAGNOSIS — H5310 Unspecified subjective visual disturbances: Secondary | ICD-10-CM | POA: Diagnosis not present

## 2015-08-18 DIAGNOSIS — H401122 Primary open-angle glaucoma, left eye, moderate stage: Secondary | ICD-10-CM | POA: Diagnosis not present

## 2015-08-18 DIAGNOSIS — H401112 Primary open-angle glaucoma, right eye, moderate stage: Secondary | ICD-10-CM | POA: Diagnosis not present

## 2015-10-09 ENCOUNTER — Encounter (HOSPITAL_COMMUNITY): Payer: Self-pay

## 2015-10-09 ENCOUNTER — Emergency Department (HOSPITAL_COMMUNITY)
Admission: EM | Admit: 2015-10-09 | Discharge: 2015-10-09 | Disposition: A | Discharge status: Home / Self Care | Payer: Medicare Other | Attending: Emergency Medicine | Admitting: Emergency Medicine

## 2015-10-09 DIAGNOSIS — R200 Anesthesia of skin: Secondary | ICD-10-CM

## 2015-10-09 DIAGNOSIS — I10 Essential (primary) hypertension: Secondary | ICD-10-CM | POA: Insufficient documentation

## 2015-10-09 DIAGNOSIS — R202 Paresthesia of skin: Secondary | ICD-10-CM | POA: Diagnosis not present

## 2015-10-09 NOTE — Discharge Instructions (Signed)
Paresthesia  Paresthesia is a burning or prickling feeling. This feeling can happen in any part of the body. It often happens in the hands, arms, legs, or feet. Usually, it is not painful. In most cases, the feeling goes away in a short time and is not a sign of a serious problem.  HOME CARE  · Avoid drinking alcohol.  · Try massage or needle therapy (acupuncture) to help with your problems.  · Keep all follow-up visits as told by your doctor. This is important.  GET HELP IF:  · You keep on having episodes of paresthesia.  · Your burning or prickling feeling gets worse when you walk.  · You have pain or cramps.  · You feel dizzy.  · You have a rash.  GET HELP RIGHT AWAY IF:  · You feel weak.  · You have trouble walking or moving.  · You have problems speaking, understanding, or seeing.  · You feel confused.  · You cannot control when you pee (urinate) or poop (bowel movement).  · You lose feeling (numbness) after an injury.  · You pass out (faint).     This information is not intended to replace advice given to you by your health care provider. Make sure you discuss any questions you have with your health care provider.     Document Released: 03/04/2008 Document Revised: 08/06/2014 Document Reviewed: 03/18/2014  Elsevier Interactive Patient Education ©2016 [employer] Inc[employer]

## 2015-10-09 NOTE — ED Notes (Signed)
Pt verbalized understanding of d/c instructions and follow-up care. No further questions/concerns, VSS, ambulatory w/ steady gait (refused wheelchair) 

## 2015-10-09 NOTE — ED Provider Notes (Signed)
CSN: [id number]     Arrival date & time 10/09/15  1056 History   First MD Initiated Contact with Patient 10/09/15 1206     Chief Complaint  Patient presents with  . Numbness    The history is provided by the patient.  Patient presents with numbness/pain/swelling to left right and little finger onset about one week ago No trauma He reports swelling is improved No fever/vomiting No weakness reported It does not extend into hand No numbness or weakness to left arm No fever/vomiting No cp/sob No neck pain He plays piano at church and is concerned this could get worse Movement worsens symptoms, rest improves symptoms  Past Medical History  Diagnosis Date  . Depression   . Visual impairment   . Hypertension    Past Surgical History  Procedure Laterality Date  . Tooth extraction    . Eye surgery      Left    Family History  Problem Relation Age of Onset  . Asthma Mother   . Cancer Father    Social History  Substance Use Topics  . Smoking status: Never Smoker   . Smokeless tobacco: Never Used  . Alcohol Use: No     Comment: socially    Review of Systems  Constitutional: Negative for fever.  Gastrointestinal: Negative for abdominal pain.  Musculoskeletal: Positive for arthralgias. Negative for back pain and neck pain.  Neurological: Positive for numbness.  All other systems reviewed and are negative.     Allergies  Hydrocodone  Home Medications   Prior to Admission medications   Medication Sig Start Date End Date Taking? Authorizing Provider  sertraline (ZOLOFT) 50 MG tablet Take 50 mg by mouth daily. 10/02/15  Yes Historical Provider, MD  amLODipine (NORVASC) 5 MG tablet Take 1 tablet (5 mg total) by mouth daily. 03/06/15   Kristen N Ward, DO  chlorhexidine (PERIDEX) 0.12 % solution Use as directed 15 mLs in the mouth or throat 2 (two) times daily. 03/22/15   Fayrene Helper, PA-C  guaiFENesin (ROBITUSSIN) 100 MG/5ML liquid Take 5-10 mLs (100-200 mg total) by mouth  every 4 (four) hours as needed for cough or congestion. 03/22/15   Fayrene Helper, PA-C  penicillin v potassium (VEETID) 500 MG tablet Take 1 tablet (500 mg total) by mouth 4 (four) times daily. 03/06/15   Kristen N Ward, DO   BP 120/91 mmHg  Pulse 87  Temp(Src) 98.7 F (37.1 C) (Oral)  Resp 16  Ht 5\' 10"  (1.778 m)  Wt 106.595 kg  BMI 33.72 kg/m2  SpO2 98% Physical Exam CONSTITUTIONAL: Well developed/well nourished HEAD: Normocephalic/atraumatic ENMT: Mucous membranes moist NECK: supple no meningeal signs SPINE/BACK:entire spine nontender CV: S1/S2 noted, no murmurs/rubs/gallops noted LUNGS: Lungs are clear to auscultation bilaterally, no apparent distress ABDOMEN: soft, nontender NEURO: Pt is awake/alert/appropriate, moves all extremitiesx4.  No facial droop.   Equal power (5/5) with hand grip, wrist flex/extension, elbow flex/extension,  No focal sensory deficit to light touch is noted in either UE.   Equal (2+) biceps/brachioradialis/ reflex in bilateral UE EXTREMITIES: pulses normal/equal, full ROM. Mild tenderness to left ring/little finger No signs of trauma No deformities.  No erythema/edema/induration noted. No herpetic lesions.  No abscess noted SKIN: warm, color normal PSYCH: mildly anxious  ED Course  Procedures Exam unremarkable I offered xray imaging but patient declined He will return if this worsens Hand referral given to patient MDM   Final diagnoses:  Numbness and tingling in left hand    Nursing notes  including past medical history and social history reviewed and considered in documentation     Zadie Rhine, MD 10/09/15 1534

## 2015-10-09 NOTE — ED Notes (Signed)
Per Pt, Pt is coming from home. Pt started to notice slight swelling in the left medial fingers that was followed by numbness. Over the past week, the patient reports them getting worse. Pulses and cap refill noted to be within normal limits.

## 2015-11-03 DIAGNOSIS — G4730 Sleep apnea, unspecified: Secondary | ICD-10-CM | POA: Diagnosis not present

## 2015-11-03 DIAGNOSIS — Z6836 Body mass index (BMI) 36.0-36.9, adult: Secondary | ICD-10-CM | POA: Diagnosis not present

## 2015-11-03 DIAGNOSIS — F321 Major depressive disorder, single episode, moderate: Secondary | ICD-10-CM | POA: Diagnosis not present

## 2015-11-03 DIAGNOSIS — K5900 Constipation, unspecified: Secondary | ICD-10-CM | POA: Diagnosis not present

## 2015-11-03 DIAGNOSIS — Z113 Encounter for screening for infections with a predominantly sexual mode of transmission: Secondary | ICD-10-CM | POA: Diagnosis not present

## 2015-11-03 DIAGNOSIS — L219 Seborrheic dermatitis, unspecified: Secondary | ICD-10-CM | POA: Diagnosis not present

## 2015-11-03 DIAGNOSIS — R7303 Prediabetes: Secondary | ICD-10-CM | POA: Diagnosis not present

## 2015-11-03 DIAGNOSIS — E669 Obesity, unspecified: Secondary | ICD-10-CM | POA: Diagnosis not present

## 2015-11-03 DIAGNOSIS — J069 Acute upper respiratory infection, unspecified: Secondary | ICD-10-CM | POA: Diagnosis not present

## 2015-12-16 ENCOUNTER — Ambulatory Visit (HOSPITAL_BASED_OUTPATIENT_CLINIC_OR_DEPARTMENT_OTHER): Payer: Medicare Other | Attending: Internal Medicine

## 2015-12-30 DIAGNOSIS — R0683 Snoring: Secondary | ICD-10-CM | POA: Diagnosis not present

## 2015-12-30 DIAGNOSIS — K219 Gastro-esophageal reflux disease without esophagitis: Secondary | ICD-10-CM | POA: Diagnosis not present

## 2015-12-30 DIAGNOSIS — J343 Hypertrophy of nasal turbinates: Secondary | ICD-10-CM | POA: Diagnosis not present

## 2015-12-30 DIAGNOSIS — R490 Dysphonia: Secondary | ICD-10-CM | POA: Diagnosis not present

## 2016-01-05 ENCOUNTER — Other Ambulatory Visit (HOSPITAL_BASED_OUTPATIENT_CLINIC_OR_DEPARTMENT_OTHER): Payer: Self-pay

## 2016-01-05 DIAGNOSIS — R0683 Snoring: Secondary | ICD-10-CM

## 2016-01-05 DIAGNOSIS — G4730 Sleep apnea, unspecified: Secondary | ICD-10-CM

## 2016-02-16 ENCOUNTER — Encounter (HOSPITAL_BASED_OUTPATIENT_CLINIC_OR_DEPARTMENT_OTHER): Payer: Medicare Other

## 2016-03-05 DIAGNOSIS — Z119 Encounter for screening for infectious and parasitic diseases, unspecified: Secondary | ICD-10-CM | POA: Diagnosis not present

## 2016-03-05 DIAGNOSIS — J209 Acute bronchitis, unspecified: Secondary | ICD-10-CM | POA: Diagnosis not present

## 2016-03-05 DIAGNOSIS — R7303 Prediabetes: Secondary | ICD-10-CM | POA: Diagnosis not present

## 2016-03-05 DIAGNOSIS — F419 Anxiety disorder, unspecified: Secondary | ICD-10-CM | POA: Diagnosis not present

## 2016-03-05 DIAGNOSIS — Z79899 Other long term (current) drug therapy: Secondary | ICD-10-CM | POA: Diagnosis not present

## 2016-03-30 ENCOUNTER — Encounter (HOSPITAL_COMMUNITY): Payer: Self-pay

## 2016-03-30 ENCOUNTER — Emergency Department (HOSPITAL_COMMUNITY): Payer: Medicare Other

## 2016-03-30 ENCOUNTER — Inpatient Hospital Stay (HOSPITAL_COMMUNITY)
Admission: EM | Admit: 2016-03-30 | Discharge: 2016-04-04 | DRG: 286 | Disposition: A | Discharge status: Home / Self Care | Payer: Medicare Other | Attending: Family Medicine | Admitting: Family Medicine

## 2016-03-30 DIAGNOSIS — R079 Chest pain, unspecified: Secondary | ICD-10-CM | POA: Diagnosis not present

## 2016-03-30 DIAGNOSIS — I272 Pulmonary hypertension, unspecified: Secondary | ICD-10-CM | POA: Diagnosis present

## 2016-03-30 DIAGNOSIS — Z885 Allergy status to narcotic agent status: Secondary | ICD-10-CM

## 2016-03-30 DIAGNOSIS — E669 Obesity, unspecified: Secondary | ICD-10-CM | POA: Diagnosis present

## 2016-03-30 DIAGNOSIS — I5043 Acute on chronic combined systolic (congestive) and diastolic (congestive) heart failure: Secondary | ICD-10-CM | POA: Diagnosis present

## 2016-03-30 DIAGNOSIS — Z6834 Body mass index (BMI) 34.0-34.9, adult: Secondary | ICD-10-CM | POA: Diagnosis not present

## 2016-03-30 DIAGNOSIS — I5189 Other ill-defined heart diseases: Secondary | ICD-10-CM

## 2016-03-30 DIAGNOSIS — F419 Anxiety disorder, unspecified: Secondary | ICD-10-CM | POA: Diagnosis present

## 2016-03-30 DIAGNOSIS — I429 Cardiomyopathy, unspecified: Secondary | ICD-10-CM | POA: Diagnosis present

## 2016-03-30 DIAGNOSIS — I248 Other forms of acute ischemic heart disease: Secondary | ICD-10-CM | POA: Diagnosis present

## 2016-03-30 DIAGNOSIS — N179 Acute kidney failure, unspecified: Secondary | ICD-10-CM | POA: Diagnosis present

## 2016-03-30 DIAGNOSIS — R0609 Other forms of dyspnea: Secondary | ICD-10-CM | POA: Diagnosis not present

## 2016-03-30 DIAGNOSIS — N189 Chronic kidney disease, unspecified: Secondary | ICD-10-CM | POA: Diagnosis present

## 2016-03-30 DIAGNOSIS — I513 Intracardiac thrombosis, not elsewhere classified: Secondary | ICD-10-CM | POA: Diagnosis not present

## 2016-03-30 DIAGNOSIS — I10 Essential (primary) hypertension: Secondary | ICD-10-CM | POA: Diagnosis not present

## 2016-03-30 DIAGNOSIS — Z79899 Other long term (current) drug therapy: Secondary | ICD-10-CM

## 2016-03-30 DIAGNOSIS — R05 Cough: Secondary | ICD-10-CM | POA: Diagnosis not present

## 2016-03-30 DIAGNOSIS — R0602 Shortness of breath: Secondary | ICD-10-CM | POA: Diagnosis not present

## 2016-03-30 DIAGNOSIS — R0600 Dyspnea, unspecified: Secondary | ICD-10-CM | POA: Diagnosis not present

## 2016-03-30 DIAGNOSIS — I509 Heart failure, unspecified: Secondary | ICD-10-CM | POA: Diagnosis not present

## 2016-03-30 DIAGNOSIS — I130 Hypertensive heart and chronic kidney disease with heart failure and stage 1 through stage 4 chronic kidney disease, or unspecified chronic kidney disease: Secondary | ICD-10-CM | POA: Diagnosis not present

## 2016-03-30 DIAGNOSIS — H547 Unspecified visual loss: Secondary | ICD-10-CM | POA: Diagnosis present

## 2016-03-30 DIAGNOSIS — F329 Major depressive disorder, single episode, unspecified: Secondary | ICD-10-CM | POA: Diagnosis present

## 2016-03-30 DIAGNOSIS — I959 Hypotension, unspecified: Secondary | ICD-10-CM | POA: Diagnosis present

## 2016-03-30 DIAGNOSIS — I5021 Acute systolic (congestive) heart failure: Secondary | ICD-10-CM | POA: Diagnosis not present

## 2016-03-30 DIAGNOSIS — I5041 Acute combined systolic (congestive) and diastolic (congestive) heart failure: Secondary | ICD-10-CM | POA: Diagnosis not present

## 2016-03-30 HISTORY — DX: Heart failure, unspecified: I50.9

## 2016-03-30 LAB — CBC
HEMATOCRIT: 45.7 % (ref 39.0–52.0)
HEMOGLOBIN: 16.4 g/dL (ref 13.0–17.0)
MCH: 29.5 pg (ref 26.0–34.0)
MCHC: 35.9 g/dL (ref 30.0–36.0)
MCV: 82.2 fL (ref 78.0–100.0)
Platelets: 291 10*3/uL (ref 150–400)
RBC: 5.56 MIL/uL (ref 4.22–5.81)
RDW: 13.4 % (ref 11.5–15.5)
WBC: 3.5 10*3/uL — ABNORMAL LOW (ref 4.0–10.5)

## 2016-03-30 LAB — BASIC METABOLIC PANEL
ANION GAP: 10 (ref 5–15)
BUN: 14 mg/dL (ref 6–20)
CALCIUM: 8.9 mg/dL (ref 8.9–10.3)
CO2: 20 mmol/L — AB (ref 22–32)
Chloride: 107 mmol/L (ref 101–111)
Creatinine, Ser: 1.5 mg/dL — ABNORMAL HIGH (ref 0.61–1.24)
GFR calc Af Amer: >60 mL/min (ref >60)
GFR calc non Af Amer: 58 mL/min — ABNORMAL LOW (ref >60)
GLUCOSE: 137 mg/dL — AB (ref 65–99)
POTASSIUM: 4 mmol/L (ref 3.5–5.1)
Sodium: 137 mmol/L (ref 135–145)

## 2016-03-30 LAB — D-DIMER, QUANTITATIVE: D-Dimer, Quant: 1.11 ug/mL-FEU — ABNORMAL HIGH (ref 0.00–0.50)

## 2016-03-30 LAB — BRAIN NATRIURETIC PEPTIDE: B Natriuretic Peptide: 477.3 pg/mL — ABNORMAL HIGH (ref 0.0–100.0)

## 2016-03-30 LAB — I-STAT TROPONIN, ED: Troponin i, poc: 0.03 ng/mL (ref 0.00–0.08)

## 2016-03-30 LAB — TROPONIN I: Troponin I: 0.04 ng/mL (ref <0.03)

## 2016-03-30 MED ORDER — SODIUM CHLORIDE 0.9% FLUSH: 3.0000 mL | INTRAVENOUS | Status: DC | PRN

## 2016-03-30 MED ORDER — ENOXAPARIN SODIUM 40 MG/0.4ML ~~LOC~~ SOLN
40.0000 mg | SUBCUTANEOUS | Status: DC
  Administered 2016-03-30: 40 mg via SUBCUTANEOUS
  Filled 2016-03-30: qty 0.4

## 2016-03-30 MED ORDER — FUROSEMIDE 10 MG/ML IJ SOLN
20.0000 mg | Freq: Once | INTRAMUSCULAR | Status: AC
  Administered 2016-03-30: 20 mg via INTRAVENOUS
  Filled 2016-03-30: qty 2

## 2016-03-30 MED ORDER — ACETAMINOPHEN 325 MG PO TABS: 650.0000 mg | ORAL_TABLET | ORAL | Status: DC | PRN

## 2016-03-30 MED ORDER — SODIUM CHLORIDE 0.9% FLUSH
3.0000 mL | Freq: Two times a day (BID) | INTRAVENOUS | Status: DC
  Administered 2016-03-30 – 2016-04-03 (×7): 3 mL via INTRAVENOUS

## 2016-03-30 MED ORDER — SODIUM CHLORIDE 0.9 % IV SOLN: 250.0000 mL | INTRAVENOUS | Status: DC | PRN

## 2016-03-30 MED ORDER — IOPAMIDOL (ISOVUE-370) INJECTION 76%
INTRAVENOUS | Status: AC
  Administered 2016-03-30: 100 mL via INTRAVENOUS
  Filled 2016-03-30: qty 100

## 2016-03-30 MED ORDER — ONDANSETRON HCL 4 MG/2ML IJ SOLN: 4.0000 mg | Freq: Four times a day (QID) | INTRAMUSCULAR | Status: DC | PRN

## 2016-03-30 NOTE — ED Provider Notes (Signed)
[facility]-EMERGENCY DEPT Provider Note   CSN: [id number] Arrival date & time: 03/30/16  1221     History   Chief Complaint Chief Complaint  Patient presents with  . Chest Pain    HPI Kenneth Robertson is a 35 y.o. male.  The history is provided by the patient and medical records. No language interpreter was used.  Shortness of Breath  This is a new problem. The average episode lasts 1 month. The current episode started more than 1 week ago. The problem has been gradually worsening (over last 1 week and more particularly over last 2 days). Associated symptoms include cough (yellow), sputum production, orthopnea, chest pain (sharp, R sided) and vomiting (last night). Pertinent negatives include no fever, no rhinorrhea, no sore throat, no wheezing, no rash, no leg pain and no leg swelling. He has had no prior hospitalizations. He has had prior ED visits. He has had no prior ICU admissions. Associated medical issues do not include asthma, COPD, pneumonia, chronic lung disease, PE, CAD, DVT or recent surgery.    Past Medical History:  Diagnosis Date  . Depression   . Hypertension   . Visual impairment     Patient Active Problem List   Diagnosis Date Noted  . Acute CHF (congestive heart failure) (HCC) 03/30/2016    Past Surgical History:  Procedure Laterality Date  . EYE SURGERY     Left   . TOOTH EXTRACTION         Home Medications    Prior to Admission medications   Medication Sig Start Date End Date Taking? Authorizing Provider  guaiFENesin (ROBITUSSIN) 100 MG/5ML liquid Take 5-10 mLs (100-200 mg total) by mouth every 4 (four) hours as needed for cough or congestion. 03/22/15  Yes Fayrene Helper, PA-C  sertraline (ZOLOFT) 50 MG tablet Take 50 mg by mouth daily. 10/02/15  Yes Historical Provider, MD  amLODipine (NORVASC) 5 MG tablet Take 1 tablet (5 mg total) by mouth daily. Patient not taking: Reported on 03/30/2016 03/06/15   Kristen N Ward, DO  chlorhexidine (PERIDEX) 0.12 %  solution Use as directed 15 mLs in the mouth or throat 2 (two) times daily. Patient not taking: Reported on 03/30/2016 03/22/15   Fayrene Helper, PA-C  penicillin v potassium (VEETID) 500 MG tablet Take 1 tablet (500 mg total) by mouth 4 (four) times daily. Patient not taking: Reported on 03/30/2016 03/06/15   Layla Maw Ward, DO    Family History Family History  Problem Relation Age of Onset  . Asthma Mother   . Cancer Father     Social History Social History  Substance Use Topics  . Smoking status: Never Smoker  . Smokeless tobacco: Never Used  . Alcohol use No     Comment: socially     Allergies   Hydrocodone   Review of Systems Review of Systems  Constitutional: Negative for fever.  HENT: Negative for rhinorrhea and sore throat.   Respiratory: Positive for cough (yellow), sputum production and shortness of breath. Negative for wheezing.   Cardiovascular: Positive for chest pain (sharp, R sided) and orthopnea. Negative for leg swelling.  Gastrointestinal: Positive for nausea and vomiting (last night).  Genitourinary: Negative for dysuria and hematuria.  Skin: Negative for rash.  Psychiatric/Behavioral: Negative for agitation and confusion.  All other systems reviewed and are negative.    Physical Exam Updated Vital Signs BP 113/89 (BP Location: Left Arm)   Pulse (!) 104   Temp 98.5 F (36.9 C) (Oral)   Resp  18   Ht 5\' 11"  (1.803 m)   Wt 112.1 kg Comment: scale c  SpO2 95%   BMI 34.48 kg/m   Physical Exam  Constitutional: He is oriented to person, place, and time. He appears well-developed and well-nourished. No distress.  HENT:  Head: Normocephalic and atraumatic.  Eyes: Conjunctivae and EOM are normal.  Neck: Normal range of motion. Neck supple.  Cardiovascular: Regular rhythm.  Tachycardia present.   Pulmonary/Chest: Effort normal and breath sounds normal. No respiratory distress. He has no wheezes. He has no rales.  Abdominal: Soft. He exhibits no  distension. There is no tenderness.  Musculoskeletal: Normal range of motion. He exhibits no edema.  Neurological: He is alert and oriented to person, place, and time.  Skin: Skin is warm and dry. He is not diaphoretic.  Nursing note and vitals reviewed.    ED Treatments / Results  Labs (all labs ordered are listed, but only abnormal results are displayed) Labs Reviewed  BASIC METABOLIC PANEL - Abnormal; Notable for the following:       Result Value   CO2 20 (*)    Glucose, Bld 137 (*)    Creatinine, Ser 1.50 (*)    GFR calc non Af Amer 58 (*)    All other components within normal limits  CBC - Abnormal; Notable for the following:    WBC 3.5 (*)    All other components within normal limits  BRAIN NATRIURETIC PEPTIDE - Abnormal; Notable for the following:    B Natriuretic Peptide 477.3 (*)    All other components within normal limits  D-DIMER, QUANTITATIVE (NOT [facility]) - Abnormal; Notable for the following:    D-Dimer, Quant 1.11 (*)    All other components within normal limits  TROPONIN I - Abnormal; Notable for the following:    Troponin I 0.04 (*)    All other components within normal limits  BASIC METABOLIC PANEL  TROPONIN I  TROPONIN I  I-STAT TROPOININ, ED    EKG  EKG Interpretation  Date/Time:  Tuesday March 30 2016 12:38:31 EST Ventricular Rate:  100 PR Interval:  152 QRS Duration: 102 QT Interval:  368 QTC Calculation: 474 R Axis:   93 Text Interpretation:  Normal sinus rhythm Rightward axis Nonspecific T wave abnormality Abnormal ekg Confirmed by Gerhard MunchLOCKWOOD, ROBERT  MD ([id number]) on 03/30/2016 4:09:32 PM       Radiology Dg Chest 2 View  Result Date: 03/30/2016 CLINICAL DATA:  Two-day history of right-sided chest pain and nonproductive cough for 3 weeks. EXAM: CHEST  2 VIEW COMPARISON:  02/12/2013 FINDINGS: Cardiopericardial silhouette is upper normal to mildly enlarged. Lung volumes are low. No pulmonary edema or pleural effusion. The visualized bony  structures of the thorax are intact. IMPRESSION: Upper normal to mildly enlarged cardiopericardial silhouette. Otherwise unremarkable. Electronically Signed   By: Kennith CenterEric  Mansell M.D.   On: 03/30/2016 12:54   Ct Angio Chest Pe W And/or Wo Contrast  Result Date: 03/30/2016 CLINICAL DATA:  Shortness of breath and cough for 3 days. EXAM: CT ANGIOGRAPHY CHEST WITH CONTRAST TECHNIQUE: Multidetector CT imaging of the chest was performed using the standard protocol during bolus administration of intravenous contrast. Multiplanar CT image reconstructions and MIPs were obtained to evaluate the vascular anatomy. CONTRAST:  100 cc Omni 300 COMPARISON:  None. FINDINGS: Cardiovascular: Mild cardiac enlargement. The main pulmonary artery is patent. No saddle embolus. No lobar or segmental pulmonary artery filling defects identified. Mediastinum/Nodes: The trachea appears patent and is midline. Normal appearance  of the esophagus. Right paratracheal lymph node is prominent measuring 11 mm, image 45 of series 4. Lungs/Pleura: Bilateral pleural effusions are noted. Diffuse ground-glass attenuation is noted in both lungs. Dependent changes are noted within the posterior lower lobes. No airspace consolidation. Upper Abdomen: No acute abnormality. Musculoskeletal: No aggressive lytic or sclerotic bone lesions identified. Review of the MIP images confirms the above findings. IMPRESSION: 1. No evidence for acute pulmonary embolus. 2. Bilateral pleural effusions and diffuse ground-glass attenuation compatible with a fluid overload state. Cannot rule out CHF. Electronically Signed   By: Signa Kell M.D.   On: 03/30/2016 18:20    Procedures Procedures (including critical care time)  Medications Ordered in ED Medications  sodium chloride flush (NS) 0.9 % injection 3 mL (3 mLs Intravenous Given 03/30/16 2155)  sodium chloride flush (NS) 0.9 % injection 3 mL (not administered)  0.9 %  sodium chloride infusion (not administered)    acetaminophen (TYLENOL) tablet 650 mg (not administered)  ondansetron (ZOFRAN) injection 4 mg (not administered)  enoxaparin (LOVENOX) injection 40 mg (40 mg Subcutaneous Given 03/30/16 2155)  iopamidol (ISOVUE-370) 76 % injection (100 mLs Intravenous Contrast Given 03/30/16 1753)  furosemide (LASIX) injection 20 mg (20 mg Intravenous Given 03/30/16 1856)     Initial Impression / Assessment and Plan / ED Course  I have reviewed the triage vital signs and the nursing notes.  Pertinent labs & imaging results that were available during my care of the patient were reviewed by me and considered in my medical decision making (see chart for details).  Clinical Course     35 year old male presents today with worsening dyspnea on exertion over the last month. She denies any prior history of cardiac problems but does relate a family history of congestive heart failure with death of mother last year due to congestive heart failure. Patient is notably tachycardic but otherwise hemodynamically stable. No acute ischemic changes appreciated on review the EKG. Well's criteria score is 1.5. Obtained a d-dimer which was elevated. We then proceeded with getting a CT angiogram of the chest. This did not demonstrate any obvious acute pulmonary emboli but did show findings concerning for congestive heart failure exacerbation.   Given new onset today feels the patient would be appropriate for admission for further workup. I discussed this with the patient who is agreeable with this. Family medicine evaluated the patient here in the emergency department agreeable to admit. Patient was given a one-time dose of Lasix IV here in the emergency department. He was stable the time of admission.  Final Clinical Impressions(s) / ED Diagnoses   Final diagnoses:  Dyspnea on exertion  New onset of congestive heart failure [facility])    New Prescriptions Current Discharge Medication List       Madolyn Frieze, MD 03/31/16  4782    Gerhard Munch, MD 03/31/16 [phone]

## 2016-03-30 NOTE — ED Triage Notes (Signed)
Pt presents for evaluation of CP x 2 days. Pt reports non productive cough x 3 weeks, prescribed inhaler by PCP but states it is not improving. Pt. Reports CP is worse with coughing. Pt AxO x4, in NAD.

## 2016-03-30 NOTE — H&P (Signed)
Family [facility] History and Physical Service Pager: [phone]  Patient name: Kenneth Robertson Medical record number: [id number] Date of birth: 07-07-1980 Age: 35 y.o. Gender: male  Primary Care Provider: [facility] Consultants: Cardiology Code Status: FULL  Chief Complaint: shortness of exertion  Assessment and Plan: Kenneth LarsenBernard V Robertson is a 35 y.o. male presenting with SOB. PMH is significant for HTN (never on any medications), depression, visual impairment.  New acute heart failure. Uncertain etiology given patient age, does have strong maternal family hx of HF but all were in late 8350s or older. Also considered post-viral cardiomyopathy but no illness per history. Patient presented with worsening dyspnea with orthopnea, productive cough without fever/chills. Afebrile, VSS but BP in ED on presentation elevated to 124/100. BNP 477.3 and CTA chest showing b/l pleural effusions with diffuse ground-glass attenuation compatible with fluid overload state. Received lasix 20mg  in the ED for diuresis. POC troponin 0.03 on admit, likely due to demand, with atypical chest pain reproducible by palpation and coughing that is not present at rest. - Admit to inpatient, attending Dr. Gwendolyn GrantWalden - s/p lasix 20mg  IV in ED - monitor on telemetry - Strict I/Os - Daily weights  - cardiology consult in am, appreciate recommendations - echo in am  - repeat troponin   HTN with hypotension on admit, BP 96/75. Has had elevated BP per chart review to 151/108 last year. Never been on antihypertensives. - Monitor BP - hold off on starting medication given hypotensive on admit  Possible AKI vs CKD. May be due to cardiorenal from acute heart failure. However patient creatinine has had slow increase over the last 3-4 years. On 09/17/2011 was 1.02 then increased to 1.19 in 2014, 1.38 in 2016 and on admit 1.5  - monitor BMP  Anxiety/depression Patient's mother recently passed away from HF  last year, has been grieving. At home was on zoloft but patient has been using on prn basis, approximately 3x per week and has not taken in the last week. - holding home zoloft   FEN/GI: heart healthy diet Prophylaxis: lovenox  Disposition: home pending cardiac work up  History of Present Illness:  Kenneth LarsenBernard V Robertson is a 35 y.o. male presenting with shortness of breath.   States started having dyspnea on exertion and cough for a short time on November that self resolved then approximately 1 month ago shortness of breath returned and has been progressively worsening. Has been using albuterol inhaler with some relief. However, for the past 2-3 days has been especially short of breath, now at rest. Also has orthopnea, has unable to lay flat at night so has been sleeping sitting up in couch. Reports has been eating fatty foods and salty foods. Also endorses intermittent right sided chest pain associated with cough over the past few days. Reports cough is productive with yellow phlegm. Feels sharp, starts adjacent to sternum and radiates diagonally across R chest but does not radiate to back or neck. Worse with movement of upper body or to touch. Denies LE edema, fever/chills. Also reported of nausea and abdominal discomfort last week that patient attributes to Zoloft. This has resolved since then. Also reported of two episodes of diarrhea yesterday but did not have any episodes today.    Review Of Systems: Per HPI with the following additions:   Review of Systems  Constitutional: Negative for chills and fever.  HENT: Negative for congestion and sore throat.   Respiratory: Positive for cough, sputum production and shortness of  breath. Negative for wheezing.   Cardiovascular: Positive for chest pain and orthopnea. Negative for palpitations and leg swelling.  Gastrointestinal: Positive for diarrhea. Negative for abdominal pain, constipation, nausea and vomiting.  Neurological: Negative for dizziness and  headaches.  Psychiatric/Behavioral: Positive for depression. Negative for substance abuse. The patient is nervous/anxious.     Patient Active Problem List   Diagnosis Date Noted  . Acute CHF (congestive heart failure) (HCC) 03/30/2016    Past Medical History: Past Medical History:  Diagnosis Date  . Depression   . Hypertension   . Visual impairment     Past Surgical History: Past Surgical History:  Procedure Laterality Date  . EYE SURGERY     Left   . TOOTH EXTRACTION      Social History: Social History  Substance Use Topics  . Smoking status: Never Smoker  . Smokeless tobacco: Never Used  . Alcohol use No     Comment: socially   Additional social history: Denies EtOH use. Lifetime nonsmoker. Denies recreational drug use. Lives at home with roommate. Please also refer to relevant sections of EMR.  Family History: Family History  Problem Relation Age of Onset  . Asthma Mother   . Cancer Father   Mother diagnosed with congestive heart failure in late 17s and died in her 75s. Multiple other family members on maternal side with CHF.  Allergies and Medications: Allergies  Allergen Reactions  . Hydrocodone Nausea Only   No current facility-administered medications on file prior to encounter.    Current Outpatient Prescriptions on File Prior to Encounter  Medication Sig Dispense Refill  . guaiFENesin (ROBITUSSIN) 100 MG/5ML liquid Take 5-10 mLs (100-200 mg total) by mouth every 4 (four) hours as needed for cough or congestion. 60 mL 0  . sertraline (ZOLOFT) 50 MG tablet Take 50 mg by mouth daily.    Marland Kitchen amLODipine (NORVASC) 5 MG tablet Take 1 tablet (5 mg total) by mouth daily. (Patient not taking: Reported on 03/30/2016) 30 tablet 1  . chlorhexidine (PERIDEX) 0.12 % solution Use as directed 15 mLs in the mouth or throat 2 (two) times daily. (Patient not taking: Reported on 03/30/2016) 120 mL 0  . penicillin v potassium (VEETID) 500 MG tablet Take 1 tablet (500 mg  total) by mouth 4 (four) times daily. (Patient not taking: Reported on 03/30/2016) 40 tablet 0    Objective: BP 96/75 (BP Location: Left Arm)   Pulse 99   Temp 97.6 F (36.4 C) (Oral)   Resp 22   SpO2 100%  Exam: General: sitting propped up in bed, appears mildly short of breath Eyes: EOMI, conjunctiva normal ENTM: MMM Neck: supple, normal ROM Cardiovascular: tachycardic, regular s1 and s2, no murmurs. + JVD. Peripheral pulses 2+. No LE edema noted.  Respiratory: appears mildly short of breath but able to converse. Crackles b/l to mid lung fields. No wheezing. Good air movement in upper lung fields Gastrointestinal: soft, nontender, nondistended, + bs MSK: normal ROM Derm: warm and dry  Neuro: alert and oriented, no focal deficits Psych: appears anxious  Labs and Imaging: CBC BMET   Recent Labs Lab 03/30/16 1242  WBC 3.5*  HGB 16.4  HCT 45.7  PLT 291    Recent Labs Lab 03/30/16 1242  NA 137  K 4.0  CL 107  CO2 20*  BUN 14  CREATININE 1.50*  GLUCOSE 137*  CALCIUM 8.9    BNP 477.3 POC troponin 0.03 D-dimer 1.11  Dg Chest 2 View  Result Date: 03/30/2016  CLINICAL DATA:  Two-day history of right-sided chest pain and nonproductive cough for 3 weeks. EXAM: CHEST  2 VIEW COMPARISON:  02/12/2013 FINDINGS: Cardiopericardial silhouette is upper normal to mildly enlarged. Lung volumes are low. No pulmonary edema or pleural effusion. The visualized bony structures of the thorax are intact. IMPRESSION: Upper normal to mildly enlarged cardiopericardial silhouette. Otherwise unremarkable. Electronically Signed   By: Kennith Center M.D.   On: 03/30/2016 12:54   Ct Angio Chest Pe W And/or Wo Contrast  Result Date: 03/30/2016 CLINICAL DATA:  Shortness of breath and cough for 3 days. EXAM: CT ANGIOGRAPHY CHEST WITH CONTRAST TECHNIQUE: Multidetector CT imaging of the chest was performed using the standard protocol during bolus administration of intravenous contrast. Multiplanar CT  image reconstructions and MIPs were obtained to evaluate the vascular anatomy. CONTRAST:  100 cc Omni 300 COMPARISON:  None. FINDINGS: Cardiovascular: Mild cardiac enlargement. The main pulmonary artery is patent. No saddle embolus. No lobar or segmental pulmonary artery filling defects identified. Mediastinum/Nodes: The trachea appears patent and is midline. Normal appearance of the esophagus. Right paratracheal lymph node is prominent measuring 11 mm, image 45 of series 4. Lungs/Pleura: Bilateral pleural effusions are noted. Diffuse ground-glass attenuation is noted in both lungs. Dependent changes are noted within the posterior lower lobes. No airspace consolidation. Upper Abdomen: No acute abnormality. Musculoskeletal: No aggressive lytic or sclerotic bone lesions identified. Review of the MIP images confirms the above findings. IMPRESSION: 1. No evidence for acute pulmonary embolus. 2. Bilateral pleural effusions and diffuse ground-glass attenuation compatible with a fluid overload state. Cannot rule out CHF. Electronically Signed   By: Signa Kell M.D.   On: 03/30/2016 18:20    Leland Her, DO 03/30/2016, 6:49 PM PGY-1, [facility] FPTS Intern pager: [phone], text pages welcome  UPPER LEVEL ADDENDUM  I have read the above note and made revisions highlighted in blue.  Palma Holter, MD PGY-2 [facility] Pager [phone]

## 2016-03-30 NOTE — ED Notes (Signed)
Patient watching television, no needs at this time; visitors at bedside

## 2016-03-30 NOTE — ED Notes (Signed)
Patient transported to CT 

## 2016-03-30 NOTE — ED Notes (Signed)
Brought  Patient back to room; patient undressed, in gown, on monitor, continuous pulse oximetry and blood pressure cuff

## 2016-03-31 ENCOUNTER — Inpatient Hospital Stay (HOSPITAL_COMMUNITY): Payer: Medicare Other

## 2016-03-31 ENCOUNTER — Encounter (HOSPITAL_COMMUNITY): Payer: Self-pay | Admitting: Physician Assistant

## 2016-03-31 DIAGNOSIS — R0600 Dyspnea, unspecified: Secondary | ICD-10-CM

## 2016-03-31 DIAGNOSIS — I509 Heart failure, unspecified: Secondary | ICD-10-CM

## 2016-03-31 DIAGNOSIS — I5041 Acute combined systolic (congestive) and diastolic (congestive) heart failure: Secondary | ICD-10-CM

## 2016-03-31 LAB — BASIC METABOLIC PANEL
Anion gap: 7 (ref 5–15)
BUN: 16 mg/dL (ref 6–20)
CHLORIDE: 107 mmol/L (ref 101–111)
CO2: 25 mmol/L (ref 22–32)
CREATININE: 1.57 mg/dL — AB (ref 0.61–1.24)
Calcium: 8.7 mg/dL — ABNORMAL LOW (ref 8.9–10.3)
GFR calc Af Amer: >60 mL/min (ref >60)
GFR calc non Af Amer: 55 mL/min — ABNORMAL LOW (ref >60)
Glucose, Bld: 102 mg/dL — ABNORMAL HIGH (ref 65–99)
Potassium: 4.1 mmol/L (ref 3.5–5.1)
SODIUM: 139 mmol/L (ref 135–145)

## 2016-03-31 LAB — ECHOCARDIOGRAM COMPLETE
Height: 71 in
WEIGHTICAEL: 3937.6 [oz_av]

## 2016-03-31 LAB — TROPONIN I
TROPONIN I: 0.03 ng/mL — AB (ref <0.03)
TROPONIN I: 0.03 ng/mL — AB (ref <0.03)

## 2016-03-31 MED ORDER — FUROSEMIDE 40 MG PO TABS
40.0000 mg | ORAL_TABLET | Freq: Every day | ORAL | Status: DC
  Filled 2016-03-31: qty 1

## 2016-03-31 MED ORDER — ENOXAPARIN SODIUM 120 MG/0.8ML ~~LOC~~ SOLN
1.0000 mg/kg | Freq: Two times a day (BID) | SUBCUTANEOUS | Status: DC
  Administered 2016-03-31 – 2016-04-01 (×2): 110 mg via SUBCUTANEOUS
  Filled 2016-03-31 (×2): qty 0.8

## 2016-03-31 MED ORDER — PERFLUTREN LIPID MICROSPHERE
1.0000 mL | INTRAVENOUS | Status: AC | PRN
  Administered 2016-03-31: 3 mL via INTRAVENOUS
  Filled 2016-03-31: qty 10

## 2016-03-31 MED ORDER — FUROSEMIDE 10 MG/ML IJ SOLN
20.0000 mg | Freq: Two times a day (BID) | INTRAMUSCULAR | Status: DC
  Administered 2016-03-31: 20 mg via INTRAVENOUS
  Filled 2016-03-31 (×2): qty 2

## 2016-03-31 MED ORDER — LOSARTAN POTASSIUM 25 MG PO TABS
25.0000 mg | ORAL_TABLET | Freq: Every day | ORAL | Status: DC
  Administered 2016-03-31 – 2016-04-04 (×5): 25 mg via ORAL
  Filled 2016-03-31 (×5): qty 1

## 2016-03-31 MED ORDER — FUROSEMIDE 10 MG/ML IJ SOLN
20.0000 mg | Freq: Once | INTRAMUSCULAR | Status: AC
  Administered 2016-03-31: 20 mg via INTRAVENOUS
  Filled 2016-03-31: qty 2

## 2016-03-31 NOTE — Progress Notes (Signed)
ANTICOAGULATION CONSULT NOTE - Initial Consult  Pharmacy Consult for Lovenox Indication: apical thrombus  Allergies  Allergen Reactions  . Hydrocodone Nausea Only    Patient Measurements: Height: 5\' 11"  (180.3 cm) Weight: 246 lb 1.6 oz (111.6 kg) (scale c) IBW/kg (Calculated) : 75.3  Vital Signs: Temp: 98.5 F (36.9 C) (12/27 1155) Temp Source: Oral (12/27 1155) BP: 117/87 (12/27 1155) Pulse Rate: 98 (12/27 1155)  Labs:  Recent Labs  03/30/16 1242 03/30/16 2045 03/31/16 0221 03/31/16 0840  HGB 16.4  --   --   --   HCT 45.7  --   --   --   PLT 291  --   --   --   CREATININE 1.50*  --  1.57*  --   TROPONINI  --  0.04* 0.03* 0.03*    Estimated Creatinine Clearance: 82.6 mL/min (by C-G formula based on SCr of 1.57 mg/dL (H)).   Medical History: Past Medical History:  Diagnosis Date  . Depression   . Hypertension   . Visual impairment    Assessment: 35yom with new apical thrombus to begin full dose lovenox. Received prophylactic dose lovenox 40mg  yesterday at 2155. Weight = 111kg, CrCl 71ml/min.  Goal of Therapy:  Anti-Xa level 0.6-1 units/ml 4hrs after LMWH dose given Monitor platelets by anticoagulation protocol: Yes   Plan:  1) Lovenox 110mg  sq q12 2) CBC q72h at least 3) Follow up transition to oral AC  Fredrik Rigger 03/31/2016,2:29 PM

## 2016-03-31 NOTE — Progress Notes (Signed)
Family Medicine Teaching Service Daily Progress Note Intern Pager: [phone]  Patient name: Kenneth Robertson Medical record number: [id number] Date of birth: 01-14-1981 Age: 35 y.o. Gender: male  Primary [facility] Provider: [facility] Consultants: cardiology Code Status: full  Pt Overview and Major Events to Date:  12/26- admitted to FPTS  Assessment and Plan: Kenneth Robertson is a 35 y.o. male presenting with SOB. PMH is significant for HTN (never on any medications), depression, visual impairment.  New onset acute heart failure. Uncertain etiology given patient age, does have strong maternal family hx of HF but all were in late 87s or older. Also considered post-viral cardiomyopathy but no illness per history. Patient presented with worsening dyspnea with orthopnea, productive cough without fever/chills. Afebrile, VSS but BP in ED on presentation elevated to 124/100. BNP 477.3 and CTA chest showing b/l pleural effusions with diffuse ground-glass attenuation compatible with fluid overload state. Received lasix  in the ED for diuresis. POC troponin 0.03 on admit, likely due to demand, with atypical chest pain reproducible by palpation and coughing that is not present at rest. Patient states he drinks alcohol socially. - s/p lasix  IV in ED and repeat  IV overnight.  - PO diuretics today 40 mg Lasix - monitor on telemetry - Strict I/Os- total output 1.2 liters - Daily weights  - cardiology consulted, appreciate recommendations - check UDS, A1C, lipid panel - echo pending - troponin trend 0.03 > 0.04 > 0.03  HTN with hypotension on admit, BP 112/82 this morning. Has had elevated BP per chart review to 151/108 last year. - Monitor BP  Possible AKI vs CKD. May be due to cardiorenal from acute heart failure. However patient creatinine has had slow increase over the last 3-4 years. On 09/17/2011 was 1.02 then increased to 1.19 in 2014, 1.38 in 2016 and on admit 1.5  - monitor  BMP  Anxiety/depression Patient's mother recently passed away from HF last year, has been grieving. At home was on zoloft but patient has been using on prn basis, approximately 3x per week and has not taken in the last week. - holding home zoloft   FEN/GI: heart healthy diet Prophylaxis: lovenox  Disposition: pending clinical improvement  Subjective:  Kenneth Robertson is feeling much improved today. He would like to go home. Shortness of breath and cough has resolved. No chest pain.  Objective: Temp:  [97.6 F (36.4 C)-98.5 F (36.9 C)] 98.2 F (36.8 C) (12/27 0519) Pulse Rate:  [97-111] 97 (12/27 0519) Resp:  [16-32] 18 (12/27 0519) BP: (96-125)/(75-100) 112/82 (12/27 0519) SpO2:  [95 %-100 %] 95 % (12/27 0519) FiO2 (%):  [21 %] 21 % (12/26 2039) Weight:  [246 lb 1.6 oz (111.6 kg)-247 lb 3.2 oz (112.1 kg)] 246 lb 1.6 oz (111.6 kg) (12/27 0519) Physical Exam: General: sitting up in bedside chair, NAD Cardiovascular: RRR no MRG Respiratory: CTA bilaterally no increased work of breathing Abdomen: soft, non-tender, non-distended, +BS Extremities: no edema or cyanosis  Laboratory:  Recent Labs Lab 03/30/16 1242  WBC 3.5*  HGB 16.4  HCT 45.7  PLT 291    Recent Labs Lab 03/30/16 1242 03/31/16 0221  NA 137 139  K 4.0 4.1  CL 107 107  CO2 20* 25  BUN 14 16  CREATININE 1.50* 1.57*  CALCIUM 8.9 8.7*  GLUCOSE 137* 102*   BNP 477.3  Imaging/Diagnostic Tests: Dg Chest 2 View  Result Date: 03/30/2016 CLINICAL DATA:  Two-day history of right-sided chest pain and nonproductive cough  for 3 weeks. EXAM: CHEST  2 VIEW COMPARISON:  02/12/2013 FINDINGS: Cardiopericardial silhouette is upper normal to mildly enlarged. Lung volumes are low. No pulmonary edema or pleural effusion. The visualized bony structures of the thorax are intact. IMPRESSION: Upper normal to mildly enlarged cardiopericardial silhouette. Otherwise unremarkable. Electronically Signed   By: Kennith Center M.D.   On:  03/30/2016 12:54   Ct Angio Chest Pe W And/or Wo Contrast  Result Date: 03/30/2016 CLINICAL DATA:  Shortness of breath and cough for 3 days. EXAM: CT ANGIOGRAPHY CHEST WITH CONTRAST TECHNIQUE: Multidetector CT imaging of the chest was performed using the standard protocol during bolus administration of intravenous contrast. Multiplanar CT image reconstructions and MIPs were obtained to evaluate the vascular anatomy. CONTRAST:  100 cc Omni 300 COMPARISON:  None. FINDINGS: Cardiovascular: Mild cardiac enlargement. The main pulmonary artery is patent. No saddle embolus. No lobar or segmental pulmonary artery filling defects identified. Mediastinum/Nodes: The trachea appears patent and is midline. Normal appearance of the esophagus. Right paratracheal lymph node is prominent measuring 11 mm, image 45 of series 4. Lungs/Pleura: Bilateral pleural effusions are noted. Diffuse ground-glass attenuation is noted in both lungs. Dependent changes are noted within the posterior lower lobes. No airspace consolidation. Upper Abdomen: No acute abnormality. Musculoskeletal: No aggressive lytic or sclerotic bone lesions identified. Review of the MIP images confirms the above findings. IMPRESSION: 1. No evidence for acute pulmonary embolus. 2. Bilateral pleural effusions and diffuse ground-glass attenuation compatible with a fluid overload state. Cannot rule out CHF. Electronically Signed   By: Signa Kell M.D.   On: 03/30/2016 18:20     Tillman Sers, DO 03/31/2016, 9:52 AM PGY-1,  Family Medicine FPTS Intern pager: [phone], text pages welcome

## 2016-03-31 NOTE — Progress Notes (Signed)
Patient transferred to unit via stretcher from the Ed. Patient alert, oriented and ambulatory. Patient oriented to room. Patient resting in bed. Call bell within reach. Will continue to monitor.

## 2016-03-31 NOTE — Progress Notes (Signed)
  Echocardiogram 2D Echocardiogram has been performed with definity.  Marisue Humble 03/31/2016, 11:53 AM

## 2016-03-31 NOTE — Consult Note (Signed)
CARDIOLOGY CONSULT NOTE   Patient ID: Kenneth Robertson MRN: [id number] DOB/AGE: 36-Mar-1982 36 y.o.  Admit date: 03/30/2016  Primary Physician   [facility] Primary Cardiologist   New, Dr Elease Hashimoto Reason for Consultation   CHF Requesting MD: Dr Wonda Olds  [name] is a 35 y.o. year old male with a history of untreated HTN, FH CHF age > 69, depression, visual impairment.  Pt admitted w/ CHF, cards asked to see.   In August, pt had some DOE that was significant, but that got better, he never sought help. Later, he got a roommate that smoked, the tobacco exposure caused him to cough and have more DOE. The DOE continued to progress and he saw his primary MD. He got an inhaler, which helped a little and ABX. Sx improved some.  3 days ago, he got SOB at rest and describes orthopnea and PND. No LE edema. 12/25, he felt he could not breathe at all laying down and had significant SOB at rest. He then came to the hospital. Feels much better since he got diuresed.   The only chest pain was 3 days ago, it was on the R side and hurt to cough. That has improved. No hx exertional CP.     Past Medical History:  Diagnosis Date  . Depression   . Hypertension   . Visual impairment      Past Surgical History:  Procedure Laterality Date  . EYE SURGERY     Left   . TOOTH EXTRACTION      Allergies  Allergen Reactions  . Hydrocodone Nausea Only    I have reviewed the patient's current medications . enoxaparin (LOVENOX) injection  40 mg Subcutaneous Q24H  . furosemide  40 mg Oral Daily  . sodium chloride flush  3 mL Intravenous Q12H    sodium chloride, acetaminophen, ondansetron (ZOFRAN) IV, sodium chloride flush  Prior to Admission medications   Medication Sig Start Date End Date Taking? Authorizing Provider  guaiFENesin (ROBITUSSIN) 100 MG/5ML liquid Take 5-10 mLs (100-200 mg total) by mouth every 4 (four) hours as needed for cough or congestion. 03/22/15  Yes Fayrene Helper,  PA-C  sertraline (ZOLOFT) 50 MG tablet Take 50 mg by mouth daily. 10/02/15  Yes Historical Provider, MD  amLODipine (NORVASC) 5 MG tablet Take 1 tablet (5 mg total) by mouth daily. Patient not taking: Reported on 03/30/2016 03/06/15   Kristen N Ward, DO  chlorhexidine (PERIDEX) 0.12 % solution Use as directed 15 mLs in the mouth or throat 2 (two) times daily. Patient not taking: Reported on 03/30/2016 03/22/15   Fayrene Helper, PA-C  penicillin v potassium (VEETID) 500 MG tablet Take 1 tablet (500 mg total) by mouth 4 (four) times daily. Patient not taking: Reported on 03/30/2016 03/06/15   Layla Maw Ward, DO     Social History   Social History  . Marital status: Single    Spouse name: N/A  . Number of children: N/A  . Years of education: N/A   Occupational History  . Musician, piano player and singer    Social History Main Topics  . Smoking status: Never Smoker  . Smokeless tobacco: Never Used  . Alcohol use No     Comment: socially  . Drug use: No  . Sexual activity: Yes    Birth control/ protection: None   Other Topics Concern  . Not on file   Social History Narrative   Pt lives alone in [place].  Family Status  Relation Status  . Mother Deceased  . Father Deceased   Family History  Problem Relation Age of Onset  . Heart failure Mother     died age 19 in Aug 17, 2014, diagnosed about 10 years before.  . Cancer Father     died in 08/16/08     ROS:  Full 14 point review of systems complete and found to be negative unless listed above.  Physical Exam: Blood pressure 112/82, pulse 97, temperature 98.2 F (36.8 C), temperature source Oral, resp. rate 18, height 5\' 11"  (1.803 m), weight 246 lb 1.6 oz (111.6 kg), SpO2 95 %.  General: Well developed, well nourished, male in no acute distress Head: Eyes PERRLA, No xanthomas.   Normocephalic and atraumatic, oropharynx without edema or exudate. Dentition: good Lungs: rales bilateral bases Heart: HRRR S1 S2, S3 gallop  pulses are  2+ all 4 extrem.   Neck: No carotid bruits. No lymphadenopathy.  JVD elevated at 10 cm Abdomen: Bowel sounds present, abdomen soft and non-tender without masses or hernias noted. Msk:  No spine or cva tenderness. No weakness, no joint deformities or effusions. Extremities: No clubbing or cyanosis. No edema.  Neuro: Alert and oriented X 3. No focal deficits noted. Psych:  Good affect, responds appropriately Skin: No rashes or lesions noted.  Labs:   Lab Results  Component Value Date   WBC 3.5 (L) 03/30/2016   HGB 16.4 03/30/2016   HCT 45.7 03/30/2016   MCV 82.2 03/30/2016   PLT 291 03/30/2016     Recent Labs Lab 03/31/16 0221  NA 139  K 4.1  CL 107  CO2 25  BUN 16  CREATININE 1.57*  CALCIUM 8.7*  GLUCOSE 102*   No results found for: MG  Recent Labs  03/30/16 2045 03/31/16 0221 03/31/16 0840  TROPONINI 0.04* 0.03* 0.03*    Recent Labs  03/30/16 1325  TROPIPOC 0.03   B Natriuretic Peptide  Date/Time Value Ref Range Status  03/30/2016 04:17 PM 477.3 (H) 0.0 - 100.0 pg/mL Final   Lab Results  Component Value Date   DDIMER 1.11 (H) 03/30/2016   Echo: pending, believe EF very low  ECG:  12/26 SR, ?LVH  Cath: n/a  Radiology:  Dg Chest 2 View Result Date: 03/30/2016 CLINICAL DATA:  Two-day history of right-sided chest pain and nonproductive cough for 3 weeks. EXAM: CHEST  2 VIEW COMPARISON:  02/12/2013 FINDINGS: Cardiopericardial silhouette is upper normal to mildly enlarged. Lung volumes are low. No pulmonary edema or pleural effusion. The visualized bony structures of the thorax are intact. IMPRESSION: Upper normal to mildly enlarged cardiopericardial silhouette. Otherwise unremarkable. Electronically Signed   By: Kennith Center M.D.   On: 03/30/2016 12:54   Ct Angio Chest Pe W And/or Wo Contrast Result Date: 03/30/2016 CLINICAL DATA:  Shortness of breath and cough for 3 days. EXAM: CT ANGIOGRAPHY CHEST WITH CONTRAST TECHNIQUE: Multidetector CT imaging of  the chest was performed using the standard protocol during bolus administration of intravenous contrast. Multiplanar CT image reconstructions and MIPs were obtained to evaluate the vascular anatomy. CONTRAST:  100 cc Omni 300 COMPARISON:  None. FINDINGS: Cardiovascular: Mild cardiac enlargement. The main pulmonary artery is patent. No saddle embolus. No lobar or segmental pulmonary artery filling defects identified. Mediastinum/Nodes: The trachea appears patent and is midline. Normal appearance of the esophagus. Right paratracheal lymph node is prominent measuring 11 mm, image 45 of series 4. Lungs/Pleura: Bilateral pleural effusions are noted. Diffuse ground-glass attenuation is noted in both  lungs. Dependent changes are noted within the posterior lower lobes. No airspace consolidation. Upper Abdomen: No acute abnormality. Musculoskeletal: No aggressive lytic or sclerotic bone lesions identified. Review of the MIP images confirms the above findings. IMPRESSION: 1. No evidence for acute pulmonary embolus. 2. Bilateral pleural effusions and diffuse ground-glass attenuation compatible with a fluid overload state. Cannot rule out CHF. Electronically Signed   By: Signa Kellaylor  Stroud M.D.   On: 03/30/2016 18:20    ASSESSMENT AND PLAN:   The patient was seen today by Dr Elease HashimotoNahser, the patient evaluated and the data reviewed.   Active Problems: 1.  Acute CHF (congestive heart failure) (HCC) - believe his EF is decreased, echo results pending - he needs additional diuresis, still has extra volume on exam. - dry weight unclear, he has lost 1 lb since admit, I/O neg 1 L - will change to IV Lasix at 20 mg bid, may need more but SBP 120s at most so not sure BP will tolerate. - BUN/CR will need to be followed, Cr 1.38 a year ago, 1.50 on admit, 1.57 today.  - pt thinks when he saw PCP, his problem was CHF, he did not have URI. No illnesses recently. - pt states no tobacco (2nd hand smoke for a while), no ETOH/Drug abuse and  practices safe sex (HIV negative in the past). - once echo read, decide on further plan.   SignedLeanna Battles: Barrett, Rhonda, PA-C 03/31/2016 11:11 AM Beeper [phone]  Co-Sign MD  Attending Note:   The patient was seen and examined.  Agree with assessment and plan as noted above.  Changes made to the above note as needed.  Patient seen and independently examined with Theodore Demarkhonda Barrett, PA .   We discussed all aspects of the encounter. I agree with the assessment and plan as stated above.  1 . Acute on chronic combined systolic/ diastolic  CHF:   I have reviewed echo images.   He has severe LV dysfunction  Impaired relaxation  Moderate pulmonary HTN Moderate MR  Feels better with diuresis.   Still needs more diuresis Will likely start coreg 3.125 BID tomorrow Will wait and start Losartan 25 a day today   2. Apical thrombus:     Will start Lovenox for now  - pharmacy to dose.    Will not start coumadin yet since CHF team may want to do a cath May be able to use a DOAC   3. HTN:     I have spent a total of 40 minutes with patient reviewing hospital  notes , telemetry, EKGs, labs and examining patient as well as establishing an assessment and plan that was discussed with the patient. > 50% of time was spent in direct patient care.    Vesta MixerPhilip J. Nancee Brownrigg, Montez HagemanJr., MD, [facility] 03/31/2016, 12:55 PM [address],  [address] Office [phone] Pager [phone]

## 2016-04-01 DIAGNOSIS — I513 Intracardiac thrombosis, not elsewhere classified: Secondary | ICD-10-CM

## 2016-04-01 DIAGNOSIS — I5021 Acute systolic (congestive) heart failure: Secondary | ICD-10-CM

## 2016-04-01 LAB — BASIC METABOLIC PANEL
Anion gap: 8 (ref 5–15)
BUN: 16 mg/dL (ref 6–20)
CALCIUM: 8.8 mg/dL — AB (ref 8.9–10.3)
CO2: 25 mmol/L (ref 22–32)
CREATININE: 1.54 mg/dL — AB (ref 0.61–1.24)
Chloride: 105 mmol/L (ref 101–111)
GFR calc non Af Amer: 57 mL/min — ABNORMAL LOW (ref >60)
Glucose, Bld: 98 mg/dL (ref 65–99)
Potassium: 4 mmol/L (ref 3.5–5.1)
SODIUM: 138 mmol/L (ref 135–145)

## 2016-04-01 LAB — CBC
HCT: 45.3 % (ref 39.0–52.0)
Hemoglobin: 16 g/dL (ref 13.0–17.0)
MCH: 29.1 pg (ref 26.0–34.0)
MCHC: 35.3 g/dL (ref 30.0–36.0)
MCV: 82.4 fL (ref 78.0–100.0)
PLATELETS: 284 10*3/uL (ref 150–400)
RBC: 5.5 MIL/uL (ref 4.22–5.81)
RDW: 13.7 % (ref 11.5–15.5)
WBC: 4.4 10*3/uL (ref 4.0–10.5)

## 2016-04-01 LAB — URINALYSIS, ROUTINE W REFLEX MICROSCOPIC
BILIRUBIN URINE: NEGATIVE
Glucose, UA: NEGATIVE mg/dL
Hgb urine dipstick: NEGATIVE
KETONES UR: NEGATIVE mg/dL
Leukocytes, UA: NEGATIVE
NITRITE: NEGATIVE
PROTEIN: NEGATIVE mg/dL
Specific Gravity, Urine: 1.013 (ref 1.005–1.030)
pH: 5 (ref 5.0–8.0)

## 2016-04-01 LAB — RAPID URINE DRUG SCREEN, HOSP PERFORMED
AMPHETAMINES: NOT DETECTED
BARBITURATES: NOT DETECTED
Benzodiazepines: NOT DETECTED
Cocaine: NOT DETECTED
Opiates: NOT DETECTED
TETRAHYDROCANNABINOL: NOT DETECTED

## 2016-04-01 LAB — LIPID PANEL
CHOL/HDL RATIO: 6.4 ratio
CHOLESTEROL: 199 mg/dL (ref 0–200)
HDL: 31 mg/dL — ABNORMAL LOW (ref >40)
LDL Cholesterol: 146 mg/dL — ABNORMAL HIGH (ref 0–99)
Triglycerides: 111 mg/dL (ref <150)
VLDL: 22 mg/dL (ref 0–40)

## 2016-04-01 LAB — MAGNESIUM: Magnesium: 2.1 mg/dL (ref 1.7–2.4)

## 2016-04-01 LAB — HIV ANTIBODY (ROUTINE TESTING W REFLEX): HIV Screen 4th Generation wRfx: NONREACTIVE

## 2016-04-01 MED ORDER — ASPIRIN 81 MG PO CHEW
81.0000 mg | CHEWABLE_TABLET | ORAL | Status: AC
  Administered 2016-04-02: 81 mg via ORAL
  Filled 2016-04-01: qty 1

## 2016-04-01 MED ORDER — ENOXAPARIN SODIUM 120 MG/0.8ML ~~LOC~~ SOLN
1.0000 mg/kg | Freq: Two times a day (BID) | SUBCUTANEOUS | Status: AC
  Administered 2016-04-01: 110 mg via SUBCUTANEOUS
  Filled 2016-04-01: qty 0.8

## 2016-04-01 MED ORDER — SODIUM CHLORIDE 0.9% FLUSH: 3.0000 mL | INTRAVENOUS | Status: DC | PRN

## 2016-04-01 MED ORDER — SPIRONOLACTONE 25 MG PO TABS
12.5000 mg | ORAL_TABLET | Freq: Every day | ORAL | Status: DC
  Administered 2016-04-01 – 2016-04-02 (×2): 12.5 mg via ORAL
  Filled 2016-04-01 (×2): qty 1

## 2016-04-01 MED ORDER — SODIUM CHLORIDE 0.9 % IV SOLN
INTRAVENOUS | Status: DC
Start: 2016-04-02 — End: 2016-04-02
  Administered 2016-04-02: 06:00:00 via INTRAVENOUS

## 2016-04-01 MED ORDER — SODIUM CHLORIDE 0.9 % IV SOLN: 250.0000 mL | INTRAVENOUS | Status: DC | PRN

## 2016-04-01 MED ORDER — SODIUM CHLORIDE 0.9% FLUSH
3.0000 mL | Freq: Two times a day (BID) | INTRAVENOUS | Status: DC
  Administered 2016-04-01 – 2016-04-02 (×2): 3 mL via INTRAVENOUS

## 2016-04-01 MED ORDER — POTASSIUM CHLORIDE CRYS ER 20 MEQ PO TBCR
40.0000 meq | EXTENDED_RELEASE_TABLET | Freq: Once | ORAL | Status: AC
  Administered 2016-04-01: 40 meq via ORAL
  Filled 2016-04-01: qty 2

## 2016-04-01 MED ORDER — FUROSEMIDE 10 MG/ML IJ SOLN: 60.0000 mg | Freq: Two times a day (BID) | INTRAMUSCULAR | Status: DC

## 2016-04-01 MED ORDER — FUROSEMIDE 10 MG/ML IJ SOLN
60.0000 mg | Freq: Two times a day (BID) | INTRAMUSCULAR | Status: DC
  Administered 2016-04-01 (×2): 60 mg via INTRAVENOUS
  Filled 2016-04-01 (×3): qty 6

## 2016-04-01 NOTE — Progress Notes (Signed)
Patient ID: Kenneth Robertson, male   DOB: 1980/07/07, 35 y.o.   MRN: [id number]   SUBJECTIVE: Breathing better.  Not orthopneic.   Echo: Severe LV dilation with EF 10-15%, restrictive diastolic function, LV thrombus noted, moderate MR, PASP 47 mmHg, moderately dilated and dysfunctional RV.   Scheduled Meds: . enoxaparin (LOVENOX) injection  1 mg/kg Subcutaneous Q12H  . furosemide  60 mg Intravenous BID  . losartan  25 mg Oral Daily  . potassium chloride  40 mEq Oral Once  . sodium chloride flush  3 mL Intravenous Q12H  . spironolactone  12.5 mg Oral Daily   Continuous Infusions: PRN Meds:.sodium chloride, acetaminophen, ondansetron (ZOFRAN) IV, sodium chloride flush    Vitals:   03/31/16 1155 03/31/16 2012 04/01/16 0238 04/01/16 0553  BP: 117/87 125/83 (!) 108/91 113/90  Pulse: 98 (!) 101 94 97  Resp: 20 18 18 18   Temp: 98.5 F (36.9 C) 98.3 F (36.8 C) 97.7 F (36.5 C) 97.9 F (36.6 C)  TempSrc: Oral Oral Oral Oral  SpO2: 96% 96% 96% 97%  Weight:    245 lb 3.2 oz (111.2 kg)  Height:    5\' 11"  (1.803 m)    Intake/Output Summary (Last 24 hours) at 04/01/16 0816 Last data filed at 04/01/16 0417  Gross per 24 hour  Intake              720 ml  Output              800 ml  Net              -80 ml    LABS: Basic Metabolic Panel:  Recent Labs  09/31/12 0221 04/01/16 0358  NA 139 138  K 4.1 4.0  CL 107 105  CO2 25 25  GLUCOSE 102* 98  BUN 16 16  CREATININE 1.57* 1.54*  CALCIUM 8.7* 8.8*  MG  --  2.1   Liver Function Tests: No results for input(s): AST, ALT, ALKPHOS, BILITOT, PROT, ALBUMIN in the last 72 hours. No results for input(s): LIPASE, AMYLASE in the last 72 hours. CBC:  Recent Labs  03/30/16 1242 04/01/16 0358  WBC 3.5* 4.4  HGB 16.4 16.0  HCT 45.7 45.3  MCV 82.2 82.4  PLT 291 284   Cardiac Enzymes:  Recent Labs  03/30/16 2045 03/31/16 0221 03/31/16 0840  TROPONINI 0.04* 0.03* 0.03*   BNP: Invalid input(s): POCBNP D-Dimer:  Recent  Labs  03/30/16 1617  DDIMER 1.11*   Hemoglobin A1C: No results for input(s): HGBA1C in the last 72 hours. Fasting Lipid Panel:  Recent Labs  04/01/16 0358  CHOL 199  HDL 31*  LDLCALC 146*  TRIG 111  CHOLHDL 6.4   Thyroid Function Tests: No results for input(s): TSH, T4TOTAL, T3FREE, THYROIDAB in the last 72 hours.  Invalid input(s): FREET3 Anemia Panel: No results for input(s): VITAMINB12, FOLATE, FERRITIN, TIBC, IRON, RETICCTPCT in the last 72 hours.  RADIOLOGY: Dg Chest 2 View  Result Date: 03/30/2016 CLINICAL DATA:  Two-day history of right-sided chest pain and nonproductive cough for 3 weeks. EXAM: CHEST  2 VIEW COMPARISON:  02/12/2013 FINDINGS: Cardiopericardial silhouette is upper normal to mildly enlarged. Lung volumes are low. No pulmonary edema or pleural effusion. The visualized bony structures of the thorax are intact. IMPRESSION: Upper normal to mildly enlarged cardiopericardial silhouette. Otherwise unremarkable. Electronically Signed   By: Kennith Center M.D.   On: 03/30/2016 12:54   Ct Angio Chest Pe W And/or Wo Contrast  Result Date:  03/30/2016 CLINICAL DATA:  Shortness of breath and cough for 3 days. EXAM: CT ANGIOGRAPHY CHEST WITH CONTRAST TECHNIQUE: Multidetector CT imaging of the chest was performed using the standard protocol during bolus administration of intravenous contrast. Multiplanar CT image reconstructions and MIPs were obtained to evaluate the vascular anatomy. CONTRAST:  100 cc Omni 300 COMPARISON:  None. FINDINGS: Cardiovascular: Mild cardiac enlargement. The main pulmonary artery is patent. No saddle embolus. No lobar or segmental pulmonary artery filling defects identified. Mediastinum/Nodes: The trachea appears patent and is midline. Normal appearance of the esophagus. Right paratracheal lymph node is prominent measuring 11 mm, image 45 of series 4. Lungs/Pleura: Bilateral pleural effusions are noted. Diffuse ground-glass attenuation is noted in  both lungs. Dependent changes are noted within the posterior lower lobes. No airspace consolidation. Upper Abdomen: No acute abnormality. Musculoskeletal: No aggressive lytic or sclerotic bone lesions identified. Review of the MIP images confirms the above findings. IMPRESSION: 1. No evidence for acute pulmonary embolus. 2. Bilateral pleural effusions and diffuse ground-glass attenuation compatible with a fluid overload state. Cannot rule out CHF. Electronically Signed   By: Signa Kellaylor  Stroud M.D.   On: 03/30/2016 18:20    PHYSICAL EXAM General: NAD Neck: JVP 14-16 cm, no thyromegaly or thyroid nodule.  Lungs: Clear to auscultation bilaterally with normal respiratory effort. CV: Nondisplaced PMI.  Heart regular S1/S2, no S3/S4, no murmur.  No peripheral edema.  Abdomen: Soft, nontender, no hepatosplenomegaly, no distention.  Neurologic: Alert and oriented x 3.  Psych: Normal affect. Extremities: No clubbing or cyanosis.   TELEMETRY: Reviewed telemetry pt in NSR  ASSESSMENT AND PLAN: 35 yo with history of HTN presented with acute systolic CHF.  1. Acute systolic CHF:  Patient presented after about 1 month of dyspnea.  He had had right-sided, somewhat pleuritic chest pain also.  PE CT at admission did not show PE, did show pulmonary edema.  Echo with severely dilated LV, EF 10-15%, LV thrombus, moderately dilated and moderately dysfunctional RV.  Mother had CHF but developed at older age (died in her 7570s), no other family member with CHF that he knows of.  No history of lung disease/sarcoidosis.  Rare ETOH, no drugs.  No FH of premature CAD.  Mild troponin elevation at admission, most likely due to demand ischemia from volume overload.  On exam today, remains volume overloaded.  - Increase Lasix to 60 mg IV bid.  Follow I/Os closely.  - Continue losartan, add spironolactone 12.5 daily.  If BP stable, can start low dose Bidil tomorrow.  - Would hold off on beta blocker until more diuresed.  - Check  HIV, SPEP.  - Will need right and left heart cath to rule out CAD and assess cardiac output/filling pressures.  He thinks he can lie flat, will plan for tomorrow.  Risks/benefits of procedure discussed with patient and he agrees to proceed.  - If no significant coronary disease, will need cardiac MRI to assess for myocarditis/infiltrative disease like sarcoidosis.  2. AKI: Mild elevation in creatinine, may be cardiorenal in nature, hopefully will improve with further diuresis.  3. LV apical thrombus: Noted by echo.  Currently on Lovenox, will need to transition to warfarin after cath.  Will hold dose of Lovenox in am for cath.   Marca AnconaDalton Zaul Hubers 04/01/2016 8:23 AM

## 2016-04-01 NOTE — Progress Notes (Signed)
Family Medicine Teaching Service Daily Progress Note Intern Pager: [phone]  Patient name: Kenneth Robertson Medical record number: [id number] Date of birth: Oct 10, 1980 Age: 35 y.o. Gender: male  Primary Care Provider: [facility] Consultants: cardiology Code Status: full  Pt Overview and Major Events to Date:  12/26- admitted to FPTS  Assessment and Plan: Kenneth Robertson is a 35 y.o. male presenting with SOB. PMH is significant for HTN (never on any medications), depression, visual impairment.  New onset HFrEF. Echo demonstrating EF 10-15% with diffuse severe hypokinesis, minimal contraction preserved only in the basal and mid inferior and inferolateral walls. There is a thrombus in the apex measuring 30 x 17 mm. - cardiology following, appreciate recommendations - plan for cath per HF team - lovenox for thrombus in apex of heart - IV lasix 20mg  BID - strict I&O's- net output of 1.3 L - monitor on telemetry - Daily weights  - UDS negative - A1C pending - lipid panel total chol 199- tri 111, LDL 146, HDL 31. ASCVD risk 2.9% - troponin trend 0.03 > 0.04 > 0.03  HTN with hypotension on admit, BP 113/90 this morning. Has had elevated BP per chart review to 151/108 last year. - Monitor BP  Possible AKI vs CKD. May be due to cardiorenal from acute heart failure. However patient creatinine has had slow increase over the last 3-4 years. On 09/17/2011 was 1.02 then increased to 1.19 in 2014, 1.38 in 2016 and on admit 1.5 > 1.54 - continue to monitor BMP  Anxiety/depression Patient's mother recently passed away from HF last year, has been grieving. At home was on zoloft but patient has been using on prn basis, approximately 3x per week and has not taken in the last week. - holding home zoloft as patient is not taking this consistently - can address as outpatient  FEN/GI: heart healthy diet Prophylaxis: lovenox  Disposition: pending clinical improvement  Subjective:  Kenneth Robertson is  feeling well. He hopes to go home soon. No shortness of breath or chest pain.  Objective: Temp:  [97.7 F (36.5 C)-98.5 F (36.9 C)] 97.9 F (36.6 C) (12/28 0553) Pulse Rate:  [94-101] 97 (12/28 0553) Resp:  [18-20] 18 (12/28 0553) BP: (108-125)/(83-91) 113/90 (12/28 0553) SpO2:  [96 %-97 %] 97 % (12/28 0553) Weight:  [245 lb 3.2 oz (111.2 kg)] 245 lb 3.2 oz (111.2 kg) (12/28 0553) Physical Exam: General: laying in bed, NAD Cardiovascular: RRR no MRG Respiratory: CTA bilaterally no increased work of breathing Abdomen: soft, non-tender, non-distended, +BS Extremities: no edema or cyanosis  Laboratory:  Recent Labs Lab 03/30/16 1242 04/01/16 0358  WBC 3.5* 4.4  HGB 16.4 16.0  HCT 45.7 45.3  PLT 291 284    Recent Labs Lab 03/30/16 1242 03/31/16 0221 04/01/16 0358  NA 137 139 138  K 4.0 4.1 4.0  CL 107 107 105  CO2 20* 25 25  BUN 14 16 16   CREATININE 1.50* 1.57* 1.54*  CALCIUM 8.9 8.7* 8.8*  GLUCOSE 137* 102* 98   BNP 477.3  Imaging/Diagnostic Tests: Dg Chest 2 View  Result Date: 03/30/2016 CLINICAL DATA:  Two-day history of right-sided chest pain and nonproductive cough for 3 weeks. EXAM: CHEST  2 VIEW COMPARISON:  02/12/2013 FINDINGS: Cardiopericardial silhouette is upper normal to mildly enlarged. Lung volumes are low. No pulmonary edema or pleural effusion. The visualized bony structures of the thorax are intact. IMPRESSION: Upper normal to mildly enlarged cardiopericardial silhouette. Otherwise unremarkable. Electronically Signed   By: Minerva Areola  Molli PoseyMansell M.D.   On: 03/30/2016 12:54   Ct Angio Chest Pe W And/or Wo Contrast  Result Date: 03/30/2016 CLINICAL DATA:  Shortness of breath and cough for 3 days. EXAM: CT ANGIOGRAPHY CHEST WITH CONTRAST TECHNIQUE: Multidetector CT imaging of the chest was performed using the standard protocol during bolus administration of intravenous contrast. Multiplanar CT image reconstructions and MIPs were obtained to evaluate the  vascular anatomy. CONTRAST:  100 cc Omni 300 COMPARISON:  None. FINDINGS: Cardiovascular: Mild cardiac enlargement. The main pulmonary artery is patent. No saddle embolus. No lobar or segmental pulmonary artery filling defects identified. Mediastinum/Nodes: The trachea appears patent and is midline. Normal appearance of the esophagus. Right paratracheal lymph node is prominent measuring 11 mm, image 45 of series 4. Lungs/Pleura: Bilateral pleural effusions are noted. Diffuse ground-glass attenuation is noted in both lungs. Dependent changes are noted within the posterior lower lobes. No airspace consolidation. Upper Abdomen: No acute abnormality. Musculoskeletal: No aggressive lytic or sclerotic bone lesions identified. Review of the MIP images confirms the above findings. IMPRESSION: 1. No evidence for acute pulmonary embolus. 2. Bilateral pleural effusions and diffuse ground-glass attenuation compatible with a fluid overload state. Cannot rule out CHF. Electronically Signed   By: Signa Kellaylor  Stroud M.D.   On: 03/30/2016 18:20   Echo- - Severely dilated left ventricle with severely decreased LVEF   estimated at 10-15%, There is diffuse severe hypokinesis with   minimal contraction preserved only in the basal and mid inferior   and inferolateral walls.   There is a thrombus in the apex measuring 30 x 17 mm.   RVEF is moderately decreased.   Moderate pulmonary hypertension.  Tillman SersAngela C Mclean Moya, DO 04/01/2016, 7:49 AM PGY-1, [facility] FPTS Intern pager: [phone], text pages welcome

## 2016-04-01 NOTE — Discharge Summary (Signed)
Family Medicine [facility] Discharge Summary  Patient name: Kenneth Robertson Medical record number: [id number] Date of birth: 13-Mar-1981 Age: 35 y.o. Gender: male Date of Admission: 03/30/2016  Date of Discharge: 04/04/16 Admitting Physician: Tobey Grim, MD  Primary Care Provider: [facility] Consultants: cardiology  Indication for Hospitalization: dyspnea  Discharge Diagnoses/Problem List:  HFrEF Non-ischemic cardiomyopathy HTN Obesity Hypotension  Disposition: home  Discharge Condition: stable  Discharge Exam:  General: moving around room comfortably, NAD  Cardiovascular: RRR no MRG Respiratory: CTA bilaterally no increased work of breathing Abdomen: soft, non-tender, non-distended, +BS Extremities: no edema or cyanosis  Brief Hospital Course:  Kenneth Robertson is a 35 year old with PMH of HTN (no medications) and obesity who presented to [facility] ED with dyspnea for one month. He was admitted to [facility] for management. CXR revealed cardiomegaly and fluid overload. CTA negative for PE but consistent with fluid overload. He was also fluid overloaded on exam and diuresed with IV lasix. Cardiology was consulted and HF team followed throughout his hospitalization. His echo demonstrated severely reduced EF of 10-15% with restrictive pattern and dilated cardiomyopathy. There was a clot in the apex of the heart. He was started on lovenox for this. He was started on losartan, lasix, spironolactone, and bidil for new onset HF. No clear cause of HF identified in hospital. Left and right heart cath were performed with normal coronaries but decreased output. He was started on digoxin. He was continued on lovenox with plans to bridge to warfarin. His blood pressure was on the lower side and medications could not be titrated up due to this. He was discharged home on 12/31 in stable condition with close cardiac follow up.   Issues for Follow Up:  1. INR remained low (1.07) at  discharge. Discharged with Coumadin 10 mg daily and Lovenox 110 mq SQ q12h for bridge until INR >2. This regimen was discussed with pharmacy prior to discharge.  2. Follow closely with Dr. [name] as scheduled 3. Cardiac MRI as outpatient 4. Needs BMET, INR, and digoxin levels checked at hospital follow up.   Significant Procedures: Right and Left Heart Cath on 12/29  Significant Labs and Imaging:   Recent Labs Lab 04/02/16 0326 04/03/16 0402 04/04/16 0246  WBC 4.2 4.5 4.9  HGB 15.9 15.5 15.6  HCT 44.7 43.7 44.3  PLT 290 266 264    Recent Labs Lab 03/31/16 0221 04/01/16 0358 04/02/16 0326 04/03/16 0402 04/04/16 0246  NA 139 138 138 138 137  K 4.1 4.0 3.8 3.9 4.2  CL 107 105 103 104 102  CO2 25 25 26 25 28   GLUCOSE 102* 98 93 83 93  BUN 16 16 16 17 16   CREATININE 1.57* 1.54* 1.51* 1.63* 1.60*  CALCIUM 8.7* 8.8* 8.8* 8.7* 8.8*  MG  --  2.1  --   --   --    A1C 5.7 UDS- negative HIV negative SPEP negative for monoclonal spikes   Echo- - Severely dilated left ventricle with severely decreased LVEF estimated at 10-15%, There is diffuse severe hypokinesis with minimal contraction preserved only in the basal and mid inferior and inferolateral walls. There is a thrombus in the apex measuring 30 x 17 mm. RVEF is moderately decreased. Moderate pulmonary hypertension.  Right and Left Heart Cath: 1. Normal coronaries, nonischemic cardiomyopathy.  2. Mildly elevated left and right heart filling pressures.  3. Low cardiac output.   Results/Tests Pending at Time of Discharge: none  Discharge Medications:  Allergies as of 04/04/2016      Reactions   Hydrocodone Nausea Only      Medication List    STOP taking these medications   amLODipine 5 MG tablet Commonly known as:  NORVASC   chlorhexidine 0.12 % solution Commonly known as:  PERIDEX   guaiFENesin 100 MG/5ML liquid Commonly known as:  ROBITUSSIN   penicillin v potassium 500 MG tablet Commonly  known as:  VEETID     TAKE these medications   coumadin book Misc 1 each by Does not apply route once.   digoxin 0.125 MG tablet Commonly known as:  LANOXIN Take 1 tablet (0.125 mg total) by mouth daily. Start taking on:  04/05/2016   enoxaparin 100 MG/ML injection Commonly known as:  LOVENOX Inject 1.1 mLs (110 mg total) into the skin every 12 (twelve) hours.   furosemide 40 MG tablet Commonly known as:  LASIX Take 1 tablet (40 mg total) by mouth daily. Start taking on:  04/05/2016   isosorbide-hydrALAZINE 20-37.5 MG tablet Commonly known as:  BIDIL Take 0.5 tablets by mouth 3 (three) times daily.   losartan 25 MG tablet Commonly known as:  COZAAR Take 1 tablet (25 mg total) by mouth daily. Start taking on:  04/05/2016   sertraline 50 MG tablet Commonly known as:  ZOLOFT Take 50 mg by mouth daily.   spironolactone 25 MG tablet Commonly known as:  ALDACTONE Take 1 tablet (25 mg total) by mouth daily. Start taking on:  04/05/2016   warfarin 5 MG tablet Commonly known as:  COUMADIN Take 2 tablets (10 mg total) by mouth daily.       Discharge Instructions: Please refer to Patient Instructions section of EMR for full details.  Patient was counseled important signs and symptoms that should prompt return to medical care, changes in medications, dietary instructions, activity restrictions, and follow up appointments.   Follow-Up Appointments: Follow-up Information    Marca Anconaalton McLean, MD Follow up on 04/15/2016.   Specialty:  Cardiology Why:  at 1030 for post hospital follow up. Please bring all of your medications to your visit. The code for parking is 5000. Contact information: [address] [place] [address] [phone]        Rodrigo Ranrystal Dorsey, MD. Go on 04/07/2016.   Specialty:  Family Medicine Why:  at 3711 Contact information: [address] [place] [address] [phone]        [facility]. Go on 04/07/2016.    Specialty:  Family Medicine Why:  for lab visit at 10:45 and Doctor visit at 11 am Contact information: [address] [id number] [place] [id number] [phone]          Arvilla Marketatherine Lauren Orren Pietsch, DO 04/04/2016, 10:00 AM PGY-2, [facility]

## 2016-04-02 ENCOUNTER — Encounter (HOSPITAL_COMMUNITY): Payer: Self-pay | Admitting: Cardiology

## 2016-04-02 ENCOUNTER — Encounter (HOSPITAL_COMMUNITY): Admission: EM | Disposition: A | Discharge status: Home / Self Care | Payer: Self-pay | Source: Home / Self Care

## 2016-04-02 DIAGNOSIS — I509 Heart failure, unspecified: Secondary | ICD-10-CM

## 2016-04-02 HISTORY — PX: CARDIAC CATHETERIZATION: SHX172

## 2016-04-02 LAB — PROTEIN ELECTROPHORESIS, SERUM
A/G RATIO SPE: 1.2 (ref 0.7–1.7)
Albumin ELP: 3.4 g/dL (ref 2.9–4.4)
Alpha-1-Globulin: 0.1 g/dL (ref 0.0–0.4)
Alpha-2-Globulin: 0.7 g/dL (ref 0.4–1.0)
Beta Globulin: 0.8 g/dL (ref 0.7–1.3)
GLOBULIN, TOTAL: 2.8 g/dL (ref 2.2–3.9)
Gamma Globulin: 1.1 g/dL (ref 0.4–1.8)
PDF SPE: 0
Total Protein ELP: 6.2 g/dL (ref 6.0–8.5)

## 2016-04-02 LAB — HEMOGLOBIN A1C
HEMOGLOBIN A1C: 5.7 % — AB (ref 4.8–5.6)
MEAN PLASMA GLUCOSE: 117 mg/dL

## 2016-04-02 LAB — CBC
HCT: 44.7 % (ref 39.0–52.0)
Hemoglobin: 15.9 g/dL (ref 13.0–17.0)
MCH: 29.3 pg (ref 26.0–34.0)
MCHC: 35.6 g/dL (ref 30.0–36.0)
MCV: 82.5 fL (ref 78.0–100.0)
Platelets: 290 10*3/uL (ref 150–400)
RBC: 5.42 MIL/uL (ref 4.22–5.81)
RDW: 13.5 % (ref 11.5–15.5)
WBC: 4.2 10*3/uL (ref 4.0–10.5)

## 2016-04-02 LAB — BASIC METABOLIC PANEL
Anion gap: 9 (ref 5–15)
BUN: 16 mg/dL (ref 6–20)
CALCIUM: 8.8 mg/dL — AB (ref 8.9–10.3)
CHLORIDE: 103 mmol/L (ref 101–111)
CO2: 26 mmol/L (ref 22–32)
CREATININE: 1.51 mg/dL — AB (ref 0.61–1.24)
GFR calc Af Amer: >60 mL/min (ref >60)
GFR calc non Af Amer: 58 mL/min — ABNORMAL LOW (ref >60)
Glucose, Bld: 93 mg/dL (ref 65–99)
Potassium: 3.8 mmol/L (ref 3.5–5.1)
SODIUM: 138 mmol/L (ref 135–145)

## 2016-04-02 LAB — POCT I-STAT 3, VENOUS BLOOD GAS (G3P V)
Acid-Base Excess: 1 mmol/L (ref 0.0–2.0)
Acid-Base Excess: 1 mmol/L (ref 0.0–2.0)
BICARBONATE: 26.6 mmol/L (ref 20.0–28.0)
Bicarbonate: 25.7 mmol/L (ref 20.0–28.0)
O2 Saturation: 60 %
O2 Saturation: 60 %
PCO2 VEN: 43.7 mmHg — AB (ref 44.0–60.0)
PH VEN: 7.39 (ref 7.250–7.430)
PH VEN: 7.391 (ref 7.250–7.430)
TCO2: 27 mmol/L (ref 0–100)
TCO2: 28 mmol/L (ref 0–100)
pCO2, Ven: 42.5 mmHg — ABNORMAL LOW (ref 44.0–60.0)
pO2, Ven: 32 mmHg (ref 32.0–45.0)
pO2, Ven: 32 mmHg (ref 32.0–45.0)

## 2016-04-02 LAB — PROTIME-INR
INR: 1.1
Prothrombin Time: 14.2 seconds (ref 11.4–15.2)

## 2016-04-02 SURGERY — RIGHT/LEFT HEART CATH AND CORONARY ANGIOGRAPHY
Anesthesia: LOCAL

## 2016-04-02 MED ORDER — SODIUM CHLORIDE 0.9% FLUSH: 3.0000 mL | INTRAVENOUS | Status: DC | PRN

## 2016-04-02 MED ORDER — IOPAMIDOL (ISOVUE-370) INJECTION 76%
INTRAVENOUS | Status: AC
  Filled 2016-04-02: qty 100

## 2016-04-02 MED ORDER — MIDAZOLAM HCL 2 MG/2ML IJ SOLN
INTRAMUSCULAR | Status: DC | PRN
  Administered 2016-04-02: 0.5 mg via INTRAVENOUS

## 2016-04-02 MED ORDER — HEPARIN SODIUM (PORCINE) 1000 UNIT/ML IJ SOLN
INTRAMUSCULAR | Status: AC
  Filled 2016-04-02: qty 1

## 2016-04-02 MED ORDER — ONDANSETRON HCL 4 MG/2ML IJ SOLN
INTRAMUSCULAR | Status: AC
  Filled 2016-04-02: qty 2

## 2016-04-02 MED ORDER — ACETAMINOPHEN 325 MG PO TABS: 650.0000 mg | ORAL_TABLET | ORAL | Status: DC | PRN

## 2016-04-02 MED ORDER — WARFARIN - PHARMACIST DOSING INPATIENT
Freq: Every day | Status: DC
  Administered 2016-04-03: 17:00:00

## 2016-04-02 MED ORDER — SODIUM CHLORIDE 0.9 % IV SOLN: 250.0000 mL | INTRAVENOUS | Status: DC | PRN

## 2016-04-02 MED ORDER — ENOXAPARIN SODIUM 120 MG/0.8ML ~~LOC~~ SOLN
110.0000 mg | Freq: Two times a day (BID) | SUBCUTANEOUS | Status: DC
  Administered 2016-04-02 – 2016-04-04 (×4): 110 mg via SUBCUTANEOUS
  Filled 2016-04-02 (×4): qty 0.8

## 2016-04-02 MED ORDER — FUROSEMIDE 10 MG/ML IJ SOLN
80.0000 mg | Freq: Two times a day (BID) | INTRAMUSCULAR | Status: DC
  Administered 2016-04-02 – 2016-04-03 (×3): 80 mg via INTRAVENOUS
  Filled 2016-04-02 (×3): qty 8

## 2016-04-02 MED ORDER — SODIUM CHLORIDE 0.9% FLUSH
3.0000 mL | Freq: Two times a day (BID) | INTRAVENOUS | Status: DC
  Administered 2016-04-02 – 2016-04-03 (×2): 3 mL via INTRAVENOUS

## 2016-04-02 MED ORDER — LIDOCAINE HCL (PF) 1 % IJ SOLN
INTRAMUSCULAR | Status: DC | PRN
  Administered 2016-04-02: 2 mL
  Administered 2016-04-02: 5 mL

## 2016-04-02 MED ORDER — DIGOXIN 250 MCG PO TABS
0.2500 mg | ORAL_TABLET | Freq: Every day | ORAL | Status: DC
  Administered 2016-04-02 – 2016-04-03 (×2): 0.25 mg via ORAL
  Filled 2016-04-02 (×2): qty 1
  Filled 2016-04-02: qty 2
  Filled 2016-04-02: qty 1
  Filled 2016-04-02: qty 2

## 2016-04-02 MED ORDER — MIDAZOLAM HCL 2 MG/2ML IJ SOLN
INTRAMUSCULAR | Status: AC
  Filled 2016-04-02: qty 2

## 2016-04-02 MED ORDER — LIDOCAINE HCL (PF) 1 % IJ SOLN
INTRAMUSCULAR | Status: AC
  Filled 2016-04-02: qty 30

## 2016-04-02 MED ORDER — VERAPAMIL HCL 2.5 MG/ML IV SOLN
INTRAVENOUS | Status: AC
  Filled 2016-04-02: qty 2

## 2016-04-02 MED ORDER — VERAPAMIL HCL 2.5 MG/ML IV SOLN
INTRAVENOUS | Status: DC | PRN
  Administered 2016-04-02: 10 mL via INTRA_ARTERIAL

## 2016-04-02 MED ORDER — HEPARIN (PORCINE) IN NACL 2-0.9 UNIT/ML-% IJ SOLN
INTRAMUSCULAR | Status: AC
  Filled 2016-04-02: qty 1000

## 2016-04-02 MED ORDER — FENTANYL CITRATE (PF) 100 MCG/2ML IJ SOLN
INTRAMUSCULAR | Status: DC | PRN
  Administered 2016-04-02: 50 ug via INTRAVENOUS

## 2016-04-02 MED ORDER — COUMADIN BOOK
Freq: Once | Status: DC
Start: 2016-04-02 — End: 2016-04-04
  Filled 2016-04-02 (×2): qty 1

## 2016-04-02 MED ORDER — WARFARIN SODIUM 10 MG PO TABS
10.0000 mg | ORAL_TABLET | Freq: Once | ORAL | Status: AC
  Administered 2016-04-02: 10 mg via ORAL
  Filled 2016-04-02: qty 1

## 2016-04-02 MED ORDER — ONDANSETRON HCL 4 MG/2ML IJ SOLN
4.0000 mg | Freq: Four times a day (QID) | INTRAMUSCULAR | Status: DC | PRN
Start: 2016-04-02 — End: 2016-04-04

## 2016-04-02 MED ORDER — ONDANSETRON HCL 4 MG/2ML IJ SOLN
INTRAMUSCULAR | Status: DC | PRN
  Administered 2016-04-02: 4 mg via INTRAVENOUS

## 2016-04-02 MED ORDER — HEPARIN (PORCINE) IN NACL 2-0.9 UNIT/ML-% IJ SOLN
INTRAMUSCULAR | Status: DC | PRN
  Administered 2016-04-02: 1000 mL

## 2016-04-02 MED ORDER — WARFARIN VIDEO: Freq: Once | Status: DC

## 2016-04-02 MED ORDER — ISOSORB DINITRATE-HYDRALAZINE 20-37.5 MG PO TABS
0.5000 | ORAL_TABLET | Freq: Three times a day (TID) | ORAL | Status: DC
  Administered 2016-04-02 – 2016-04-04 (×7): 0.5 via ORAL
  Filled 2016-04-02 (×7): qty 1

## 2016-04-02 MED ORDER — HEPARIN SODIUM (PORCINE) 1000 UNIT/ML IJ SOLN
INTRAMUSCULAR | Status: DC | PRN
  Administered 2016-04-02: 5500 [IU] via INTRAVENOUS

## 2016-04-02 MED ORDER — SODIUM CHLORIDE 0.9% FLUSH: 3.0000 mL | Freq: Two times a day (BID) | INTRAVENOUS | Status: DC

## 2016-04-02 MED ORDER — FENTANYL CITRATE (PF) 100 MCG/2ML IJ SOLN
INTRAMUSCULAR | Status: AC
  Filled 2016-04-02: qty 2

## 2016-04-02 MED ORDER — IOPAMIDOL (ISOVUE-370) INJECTION 76%
INTRAVENOUS | Status: DC | PRN
  Administered 2016-04-02: 60 mL via INTRAVENOUS

## 2016-04-02 MED ORDER — POTASSIUM CHLORIDE CRYS ER 20 MEQ PO TBCR
40.0000 meq | EXTENDED_RELEASE_TABLET | Freq: Once | ORAL | Status: AC
  Administered 2016-04-02: 40 meq via ORAL
  Filled 2016-04-02: qty 2

## 2016-04-02 SURGICAL SUPPLY — 12 items
CATH BALLN WEDGE 5F 110CM (CATHETERS) ×1 IMPLANT
CATH EXPO 5F FL3.5 (CATHETERS) ×1 IMPLANT
CATH EXPO 5FR FR4 (CATHETERS) ×1 IMPLANT
DEVICE RAD COMP TR BAND LRG (VASCULAR PRODUCTS) ×1 IMPLANT
GLIDESHEATH SLEND SS 6F .021 (SHEATH) ×1 IMPLANT
GUIDEWIRE INQWIRE 1.5J.035X260 (WIRE) IMPLANT
INQWIRE 1.5J .035X260CM (WIRE) ×2
KIT HEART LEFT (KITS) ×2 IMPLANT
PACK CARDIAC CATHETERIZATION (CUSTOM PROCEDURE TRAY) ×2 IMPLANT
SHEATH FAST CATH BRACH 5F 5CM (SHEATH) ×1 IMPLANT
TRANSDUCER W/STOPCOCK (MISCELLANEOUS) ×3 IMPLANT
TUBING CIL FLEX 10 FLL-RA (TUBING) ×2 IMPLANT

## 2016-04-02 NOTE — Progress Notes (Signed)
ANTICOAGULATION CONSULT NOTE - Follow Up Consult  Pharmacy Consult for Coumadin and Lovenox Indication: apical thrombus  Allergies  Allergen Reactions  . Hydrocodone Nausea Only    Patient Measurements: Height: 5\' 11"  (180.3 cm) Weight: 245 lb 3.2 oz (111.2 kg) (scale c) IBW/kg (Calculated) : 75.3  Vital Signs: Temp: 98.4 F (36.9 C) (12/29 1233) Temp Source: Oral (12/29 1233) BP: 107/71 (12/29 1233) Pulse Rate: 101 (12/29 1233)  Labs:  Recent Labs  03/30/16 2045 03/31/16 0221 03/31/16 0840 04/01/16 0358 04/02/16 0326  HGB  --   --   --  16.0 15.9  HCT  --   --   --  45.3 44.7  PLT  --   --   --  284 290  LABPROT  --   --   --   --  14.2  INR  --   --   --   --  1.10  CREATININE  --  1.57*  --  1.54* 1.51*  TROPONINI 0.04* 0.03* 0.03*  --   --     Estimated Creatinine Clearance: 85.8 mL/min (by C-G formula based on SCr of 1.51 mg/dL (H)).  Assessment: 35yom started on full dose lovenox for new apical thrombus. He is now s/p cath, lovenox to resume 8 hours post sheath removal, and he will begin coumadin. Baseline INR 1.1. Coumadin score = 12. No drug interactions identified.  Radial sheaths removed at 1330.  Goal of Therapy:  INR 2-3 Monitor platelets by anticoagulation protocol: Yes   Plan:  1) Coumadin 10mg  x 1 2) At, 2200, resume lovenox 110mg  sq q12 3) Coumadin education - book/video 4) Daily INR, CBC q72h at least  Fredrik Rigger 04/02/2016,2:02 PM

## 2016-04-02 NOTE — Progress Notes (Signed)
Received patient from cath lab with TR Band having 12cc air.  Refer to Vascular flowsheet for assessments and deflations.  Patient alert and oriented and denies pain.

## 2016-04-02 NOTE — Progress Notes (Signed)
Patient ID: Kenneth Robertson, male   DOB: Jul 25, 1980, 35 y.o.   MRN: [id number]   SUBJECTIVE: Breathing better.  Not orthopneic. He diuresed reasonably yesterday but weight stable.   Echo: Severe LV dilation with EF 10-15%, restrictive diastolic function, LV thrombus noted, moderate MR, PASP 47 mmHg, moderately dilated and dysfunctional RV.   Scheduled Meds: . furosemide  80 mg Intravenous BID  . isosorbide-hydrALAZINE  0.5 tablet Oral TID  . losartan  25 mg Oral Daily  . potassium chloride  40 mEq Oral Once  . sodium chloride flush  3 mL Intravenous Q12H  . sodium chloride flush  3 mL Intravenous Q12H  . spironolactone  12.5 mg Oral Daily   Continuous Infusions: . sodium chloride 10 mL/hr at 04/02/16 0615   PRN Meds:.sodium chloride, sodium chloride, acetaminophen, ondansetron (ZOFRAN) IV, sodium chloride flush, sodium chloride flush    Vitals:   04/01/16 0553 04/01/16 1313 04/01/16 2100 04/02/16 0503  BP: 113/90 108/83 115/84 113/86  Pulse: 97 96 (!) 103 89  Resp: 18 18 18 18   Temp: 97.9 F (36.6 C) 97.8 F (36.6 C) 98 F (36.7 C) 97.8 F (36.6 C)  TempSrc: Oral Oral Oral Oral  SpO2: 97% 98% 99% 97%  Weight: 245 lb 3.2 oz (111.2 kg)   245 lb 3.2 oz (111.2 kg)  Height: 5\' 11"  (1.803 m)       Intake/Output Summary (Last 24 hours) at 04/02/16 0843 Last data filed at 04/01/16 2352  Gross per 24 hour  Intake              700 ml  Output             2250 ml  Net            -1550 ml    LABS: Basic Metabolic Panel:  Recent Labs  50/56/97 0358 04/02/16 0326  NA 138 138  K 4.0 3.8  CL 105 103  CO2 25 26  GLUCOSE 98 93  BUN 16 16  CREATININE 1.54* 1.51*  CALCIUM 8.8* 8.8*  MG 2.1  --    Liver Function Tests: No results for input(s): AST, ALT, ALKPHOS, BILITOT, PROT, ALBUMIN in the last 72 hours. No results for input(s): LIPASE, AMYLASE in the last 72 hours. CBC:  Recent Labs  04/01/16 0358 04/02/16 0326  WBC 4.4 4.2  HGB 16.0 15.9  HCT 45.3 44.7  MCV 82.4  82.5  PLT 284 290   Cardiac Enzymes:  Recent Labs  03/30/16 2045 03/31/16 0221 03/31/16 0840  TROPONINI 0.04* 0.03* 0.03*   BNP: Invalid input(s): POCBNP D-Dimer:  Recent Labs  03/30/16 1617  DDIMER 1.11*   Hemoglobin A1C: No results for input(s): HGBA1C in the last 72 hours. Fasting Lipid Panel:  Recent Labs  04/01/16 0358  CHOL 199  HDL 31*  LDLCALC 146*  TRIG 111  CHOLHDL 6.4   Thyroid Function Tests: No results for input(s): TSH, T4TOTAL, T3FREE, THYROIDAB in the last 72 hours.  Invalid input(s): FREET3 Anemia Panel: No results for input(s): VITAMINB12, FOLATE, FERRITIN, TIBC, IRON, RETICCTPCT in the last 72 hours.  RADIOLOGY: Dg Chest 2 View  Result Date: 03/30/2016 CLINICAL DATA:  Two-day history of right-sided chest pain and nonproductive cough for 3 weeks. EXAM: CHEST  2 VIEW COMPARISON:  02/12/2013 FINDINGS: Cardiopericardial silhouette is upper normal to mildly enlarged. Lung volumes are low. No pulmonary edema or pleural effusion. The visualized bony structures of the thorax are intact. IMPRESSION: Upper normal to mildly enlarged  cardiopericardial silhouette. Otherwise unremarkable. Electronically Signed   By: Kennith Center M.D.   On: 03/30/2016 12:54   Ct Angio Chest Pe W And/or Wo Contrast  Result Date: 03/30/2016 CLINICAL DATA:  Shortness of breath and cough for 3 days. EXAM: CT ANGIOGRAPHY CHEST WITH CONTRAST TECHNIQUE: Multidetector CT imaging of the chest was performed using the standard protocol during bolus administration of intravenous contrast. Multiplanar CT image reconstructions and MIPs were obtained to evaluate the vascular anatomy. CONTRAST:  100 cc Omni 300 COMPARISON:  None. FINDINGS: Cardiovascular: Mild cardiac enlargement. The main pulmonary artery is patent. No saddle embolus. No lobar or segmental pulmonary artery filling defects identified. Mediastinum/Nodes: The trachea appears patent and is midline. Normal appearance of the  esophagus. Right paratracheal lymph node is prominent measuring 11 mm, image 45 of series 4. Lungs/Pleura: Bilateral pleural effusions are noted. Diffuse ground-glass attenuation is noted in both lungs. Dependent changes are noted within the posterior lower lobes. No airspace consolidation. Upper Abdomen: No acute abnormality. Musculoskeletal: No aggressive lytic or sclerotic bone lesions identified. Review of the MIP images confirms the above findings. IMPRESSION: 1. No evidence for acute pulmonary embolus. 2. Bilateral pleural effusions and diffuse ground-glass attenuation compatible with a fluid overload state. Cannot rule out CHF. Electronically Signed   By: Signa Kell M.D.   On: 03/30/2016 18:20    PHYSICAL EXAM General: NAD Neck: JVP 14 cm, no thyromegaly or thyroid nodule.  Lungs: Clear to auscultation bilaterally with normal respiratory effort. CV: Nondisplaced PMI.  Heart regular S1/S2, no S3/S4, no murmur.  No peripheral edema.  Abdomen: Soft, nontender, no hepatosplenomegaly, no distention.  Neurologic: Alert and oriented x 3.  Psych: Normal affect. Extremities: No clubbing or cyanosis.   TELEMETRY: Reviewed telemetry pt in NSR  ASSESSMENT AND PLAN: 35 yo with history of HTN presented with acute systolic CHF.  1. Acute systolic CHF:  Patient presented after about 1 month of dyspnea.  He had had right-sided, somewhat pleuritic chest pain also.  PE CT at admission did not show PE, did show pulmonary edema.  Echo with severely dilated LV, EF 10-15%, LV thrombus, moderately dilated and moderately dysfunctional RV.  Mother had CHF but developed at older age (died in her 2s), no other family member with CHF that he knows of.  No history of lung disease/sarcoidosis.  Rare ETOH, no drugs.  No FH of premature CAD.  HIV negative.  Mild troponin elevation at admission, most likely due to demand ischemia from volume overload.  On exam today, he remains volume overloaded with JVD.  - Increase  Lasix to 80 mg IV bid.  Follow I/Os closely.  - Continue losartan and spironolactone. - BP stable, can add low dose Bidil 0.5 tab tid.   - Would hold off on beta blocker until more diuresed.  - pending SPEP.  - Will need right and left heart cath to rule out CAD and assess cardiac output/filling pressures.  He thinks he can lie flat, will plan for later today.  Risks/benefits of procedure discussed with patient and he agrees to proceed.  - If no significant coronary disease, will need cardiac MRI to assess for myocarditis/infiltrative disease like sarcoidosis.  2. AKI: Mild elevation in creatinine, may be cardiorenal in nature, stable today.  3. LV apical thrombus: Noted by echo.  Currently on Lovenox, will need to transition to warfarin (ordered).  Will hold dose of Lovenox this am for cath then restart.   He is pretty insistent that he go  home tomorrow.  This may be a bit complicated.  On exam, he remains volume overloaded and has not diuresed a lot (will get better look by RHC today).  Additionally, he will need to be anticoagulated and if he goes home tomorrow will need to get Lovenox bridge to therapeutic INR.   Marca AnconaDalton Karenna Romanoff 04/02/2016 8:43 AM

## 2016-04-02 NOTE — H&P (View-Only) (Signed)
Patient ID: Kenneth Robertson, male   DOB: Jul 25, 1980, 35 y.o.   MRN: [id number]   SUBJECTIVE: Breathing better.  Not orthopneic. He diuresed reasonably yesterday but weight stable.   Echo: Severe LV dilation with EF 10-15%, restrictive diastolic function, LV thrombus noted, moderate MR, PASP 47 mmHg, moderately dilated and dysfunctional RV.   Scheduled Meds: . furosemide  80 mg Intravenous BID  . isosorbide-hydrALAZINE  0.5 tablet Oral TID  . losartan  25 mg Oral Daily  . potassium chloride  40 mEq Oral Once  . sodium chloride flush  3 mL Intravenous Q12H  . sodium chloride flush  3 mL Intravenous Q12H  . spironolactone  12.5 mg Oral Daily   Continuous Infusions: . sodium chloride 10 mL/hr at 04/02/16 0615   PRN Meds:.sodium chloride, sodium chloride, acetaminophen, ondansetron (ZOFRAN) IV, sodium chloride flush, sodium chloride flush    Vitals:   04/01/16 0553 04/01/16 1313 04/01/16 2100 04/02/16 0503  BP: 113/90 108/83 115/84 113/86  Pulse: 97 96 (!) 103 89  Resp: 18 18 18 18   Temp: 97.9 F (36.6 C) 97.8 F (36.6 C) 98 F (36.7 C) 97.8 F (36.6 C)  TempSrc: Oral Oral Oral Oral  SpO2: 97% 98% 99% 97%  Weight: 245 lb 3.2 oz (111.2 kg)   245 lb 3.2 oz (111.2 kg)  Height: 5\' 11"  (1.803 m)       Intake/Output Summary (Last 24 hours) at 04/02/16 0843 Last data filed at 04/01/16 2352  Gross per 24 hour  Intake              700 ml  Output             2250 ml  Net            -1550 ml    LABS: Basic Metabolic Panel:  Recent Labs  50/56/97 0358 04/02/16 0326  NA 138 138  K 4.0 3.8  CL 105 103  CO2 25 26  GLUCOSE 98 93  BUN 16 16  CREATININE 1.54* 1.51*  CALCIUM 8.8* 8.8*  MG 2.1  --    Liver Function Tests: No results for input(s): AST, ALT, ALKPHOS, BILITOT, PROT, ALBUMIN in the last 72 hours. No results for input(s): LIPASE, AMYLASE in the last 72 hours. CBC:  Recent Labs  04/01/16 0358 04/02/16 0326  WBC 4.4 4.2  HGB 16.0 15.9  HCT 45.3 44.7  MCV 82.4  82.5  PLT 284 290   Cardiac Enzymes:  Recent Labs  03/30/16 2045 03/31/16 0221 03/31/16 0840  TROPONINI 0.04* 0.03* 0.03*   BNP: Invalid input(s): POCBNP D-Dimer:  Recent Labs  03/30/16 1617  DDIMER 1.11*   Hemoglobin A1C: No results for input(s): HGBA1C in the last 72 hours. Fasting Lipid Panel:  Recent Labs  04/01/16 0358  CHOL 199  HDL 31*  LDLCALC 146*  TRIG 111  CHOLHDL 6.4   Thyroid Function Tests: No results for input(s): TSH, T4TOTAL, T3FREE, THYROIDAB in the last 72 hours.  Invalid input(s): FREET3 Anemia Panel: No results for input(s): VITAMINB12, FOLATE, FERRITIN, TIBC, IRON, RETICCTPCT in the last 72 hours.  RADIOLOGY: Dg Chest 2 View  Result Date: 03/30/2016 CLINICAL DATA:  Two-day history of right-sided chest pain and nonproductive cough for 3 weeks. EXAM: CHEST  2 VIEW COMPARISON:  02/12/2013 FINDINGS: Cardiopericardial silhouette is upper normal to mildly enlarged. Lung volumes are low. No pulmonary edema or pleural effusion. The visualized bony structures of the thorax are intact. IMPRESSION: Upper normal to mildly enlarged  cardiopericardial silhouette. Otherwise unremarkable. Electronically Signed   By: Kennith Center M.D.   On: 03/30/2016 12:54   Ct Angio Chest Pe W And/or Wo Contrast  Result Date: 03/30/2016 CLINICAL DATA:  Shortness of breath and cough for 3 days. EXAM: CT ANGIOGRAPHY CHEST WITH CONTRAST TECHNIQUE: Multidetector CT imaging of the chest was performed using the standard protocol during bolus administration of intravenous contrast. Multiplanar CT image reconstructions and MIPs were obtained to evaluate the vascular anatomy. CONTRAST:  100 cc Omni 300 COMPARISON:  None. FINDINGS: Cardiovascular: Mild cardiac enlargement. The main pulmonary artery is patent. No saddle embolus. No lobar or segmental pulmonary artery filling defects identified. Mediastinum/Nodes: The trachea appears patent and is midline. Normal appearance of the  esophagus. Right paratracheal lymph node is prominent measuring 11 mm, image 45 of series 4. Lungs/Pleura: Bilateral pleural effusions are noted. Diffuse ground-glass attenuation is noted in both lungs. Dependent changes are noted within the posterior lower lobes. No airspace consolidation. Upper Abdomen: No acute abnormality. Musculoskeletal: No aggressive lytic or sclerotic bone lesions identified. Review of the MIP images confirms the above findings. IMPRESSION: 1. No evidence for acute pulmonary embolus. 2. Bilateral pleural effusions and diffuse ground-glass attenuation compatible with a fluid overload state. Cannot rule out CHF. Electronically Signed   By: Signa Kell M.D.   On: 03/30/2016 18:20    PHYSICAL EXAM General: NAD Neck: JVP 14 cm, no thyromegaly or thyroid nodule.  Lungs: Clear to auscultation bilaterally with normal respiratory effort. CV: Nondisplaced PMI.  Heart regular S1/S2, no S3/S4, no murmur.  No peripheral edema.  Abdomen: Soft, nontender, no hepatosplenomegaly, no distention.  Neurologic: Alert and oriented x 3.  Psych: Normal affect. Extremities: No clubbing or cyanosis.   TELEMETRY: Reviewed telemetry pt in NSR  ASSESSMENT AND PLAN: 35 yo with history of HTN presented with acute systolic CHF.  1. Acute systolic CHF:  Patient presented after about 1 month of dyspnea.  He had had right-sided, somewhat pleuritic chest pain also.  PE CT at admission did not show PE, did show pulmonary edema.  Echo with severely dilated LV, EF 10-15%, LV thrombus, moderately dilated and moderately dysfunctional RV.  Mother had CHF but developed at older age (died in her 2s), no other family member with CHF that he knows of.  No history of lung disease/sarcoidosis.  Rare ETOH, no drugs.  No FH of premature CAD.  HIV negative.  Mild troponin elevation at admission, most likely due to demand ischemia from volume overload.  On exam today, he remains volume overloaded with JVD.  - Increase  Lasix to 80 mg IV bid.  Follow I/Os closely.  - Continue losartan and spironolactone. - BP stable, can add low dose Bidil 0.5 tab tid.   - Would hold off on beta blocker until more diuresed.  - pending SPEP.  - Will need right and left heart cath to rule out CAD and assess cardiac output/filling pressures.  He thinks he can lie flat, will plan for later today.  Risks/benefits of procedure discussed with patient and he agrees to proceed.  - If no significant coronary disease, will need cardiac MRI to assess for myocarditis/infiltrative disease like sarcoidosis.  2. AKI: Mild elevation in creatinine, may be cardiorenal in nature, stable today.  3. LV apical thrombus: Noted by echo.  Currently on Lovenox, will need to transition to warfarin (ordered).  Will hold dose of Lovenox this am for cath then restart.   He is pretty insistent that he go  home tomorrow.  This may be a bit complicated.  On exam, he remains volume overloaded and has not diuresed a lot (will get better look by RHC today).  Additionally, he will need to be anticoagulated and if he goes home tomorrow will need to get Lovenox bridge to therapeutic INR.   Marca AnconaDalton Floella Ensz 04/02/2016 8:43 AM

## 2016-04-02 NOTE — Interval H&P Note (Signed)
History and Physical Interval Note:  04/02/2016 1:17 PM  Kenneth Robertson  has presented today for surgery, with the diagnosis of Heart Failure  The various methods of treatment have been discussed with the patient and family. After consideration of risks, benefits and other options for treatment, the patient has consented to  Procedure(s): Right/Left Heart Cath and Coronary Angiography (N/A) as a surgical intervention .  The patient's history has been reviewed, patient examined, no change in status, stable for surgery.  I have reviewed the patient's chart and labs.  Questions were answered to the patient's satisfaction.     Rorie Delmore [employer]

## 2016-04-02 NOTE — Progress Notes (Signed)
Family Medicine Teaching Service Daily Progress Note Intern Pager: [phone]  Patient name: Kenneth Robertson Medical record number: [id number] Date of birth: Jun 16, 1980 Age: 35 y.o. Gender: male  Primary Care Provider: [facility] Consultants: cardiology Code Status: full  Pt Overview and Major Events to Date:  12/26- admitted to FPTS 12/29- left and right heart cath planned  Assessment and Plan: Kenneth Robertson is a 35 y.o. male presenting with SOB. PMH is significant for HTN (never on any medications), depression, visual impairment.  New onset HFrEF. Echo demonstrating EF 10-15% with diffuse severe hypokinesis, minimal contraction preserved only in the basal and mid inferior and inferolateral walls. There is a thrombus in the apex measuring 30 x 17 mm. - cardiology following, appreciate recommendations - plan for cath per HF team today at noon - lovenox for thrombus in apex of heart- held today for cath, will restart after cath - diuresis per HF team- currently on IV 60mg  BID - strict I&O's- net output of 2.7 - monitor on telemetry - Daily weights  - A1C pending  HTN with hypotension on admit, BP 113/86 this morning. Has had elevated BP per chart review to 151/108 last year. Currently being diuresed and adding medications for HF diagnosis. - Monitor BP closely while optimizing medications  Possible AKI vs CKD. May be due to cardiorenal from acute heart failure. However patient creatinine has had slow increase over the last 3-4 years. On 09/17/2011 was 1.02 then increased to 1.19 in 2014, 1.38 in 2016 and on admit 1.5 > 1.54 > 1.51 - continue to monitor BMP  FEN/GI: NPO for cath today Prophylaxis: lovenox (held today)  Disposition: pending clinical improvement  Subjective:  Kenneth Robertson is feeling well. He has been able to walk around some with no shortness of breath. He denies chest pain, SOB, swelling.   Objective: Temp:  [97.8 F (36.6 C)-98 F (36.7 C)] 97.8 F (36.6  C) (12/29 0503) Pulse Rate:  [89-103] 89 (12/29 0503) Resp:  [18] 18 (12/29 0503) BP: (108-115)/(83-86) 113/86 (12/29 0503) SpO2:  [97 %-99 %] 97 % (12/29 0503) Weight:  [245 lb 3.2 oz (111.2 kg)] 245 lb 3.2 oz (111.2 kg) (12/29 0503) Physical Exam: General: sitting up on side of bed, in NAD Cardiovascular: RRR no MRG Respiratory: CTA bilaterally no increased work of breathing Abdomen: soft, non-tender, non-distended, +BS Extremities: no edema or cyanosis  Laboratory:  Recent Labs Lab 03/30/16 1242 04/01/16 0358 04/02/16 0326  WBC 3.5* 4.4 4.2  HGB 16.4 16.0 15.9  HCT 45.7 45.3 44.7  PLT 291 284 290    Recent Labs Lab 03/31/16 0221 04/01/16 0358 04/02/16 0326  NA 139 138 138  K 4.1 4.0 3.8  CL 107 105 103  CO2 25 25 26   BUN 16 16 16   CREATININE 1.57* 1.54* 1.51*  CALCIUM 8.7* 8.8* 8.8*  GLUCOSE 102* 98 93   BNP 477.3 lipid panel total chol 199- tri 111, LDL 146, HDL 31. ASCVD risk 2.9% troponin trend 0.03 > 0.04 > 0.03  Imaging/Diagnostic Tests: No results found. Echo- - Severely dilated left ventricle with severely decreased LVEF   estimated at 10-15%, There is diffuse severe hypokinesis with   minimal contraction preserved only in the basal and mid inferior   and inferolateral walls.   There is a thrombus in the apex measuring 30 x 17 mm.   RVEF is moderately decreased.   Moderate pulmonary hypertension.  Tillman Sers, DO 04/02/2016, 9:41 AM PGY-1, [facility] FPTS Intern pager: [phone], text pages welcome

## 2016-04-02 NOTE — Progress Notes (Signed)
TR Band deflated to 0 cc air.  Will leave band in place x 30 minutes and assess before applying dressing.  Patient instructed not to lift with right arm.

## 2016-04-03 DIAGNOSIS — I5189 Other ill-defined heart diseases: Secondary | ICD-10-CM

## 2016-04-03 LAB — BASIC METABOLIC PANEL
ANION GAP: 9 (ref 5–15)
BUN: 17 mg/dL (ref 6–20)
CHLORIDE: 104 mmol/L (ref 101–111)
CO2: 25 mmol/L (ref 22–32)
Calcium: 8.7 mg/dL — ABNORMAL LOW (ref 8.9–10.3)
Creatinine, Ser: 1.63 mg/dL — ABNORMAL HIGH (ref 0.61–1.24)
GFR calc Af Amer: >60 mL/min (ref >60)
GFR, EST NON AFRICAN AMERICAN: 53 mL/min — AB (ref >60)
Glucose, Bld: 83 mg/dL (ref 65–99)
POTASSIUM: 3.9 mmol/L (ref 3.5–5.1)
SODIUM: 138 mmol/L (ref 135–145)

## 2016-04-03 LAB — PROTIME-INR
INR: 1.06
PROTHROMBIN TIME: 13.8 s (ref 11.4–15.2)

## 2016-04-03 LAB — CBC
HEMATOCRIT: 43.7 % (ref 39.0–52.0)
HEMOGLOBIN: 15.5 g/dL (ref 13.0–17.0)
MCH: 29.3 pg (ref 26.0–34.0)
MCHC: 35.5 g/dL (ref 30.0–36.0)
MCV: 82.6 fL (ref 78.0–100.0)
Platelets: 266 10*3/uL (ref 150–400)
RBC: 5.29 MIL/uL (ref 4.22–5.81)
RDW: 13.6 % (ref 11.5–15.5)
WBC: 4.5 10*3/uL (ref 4.0–10.5)

## 2016-04-03 LAB — TSH: TSH: 1.979 u[IU]/mL (ref 0.350–4.500)

## 2016-04-03 MED ORDER — WARFARIN SODIUM 10 MG PO TABS
10.0000 mg | ORAL_TABLET | Freq: Once | ORAL | Status: AC
  Administered 2016-04-03: 10 mg via ORAL
  Filled 2016-04-03: qty 1

## 2016-04-03 MED ORDER — SPIRONOLACTONE 25 MG PO TABS
25.0000 mg | ORAL_TABLET | Freq: Every day | ORAL | Status: DC
  Administered 2016-04-03 – 2016-04-04 (×2): 25 mg via ORAL
  Filled 2016-04-03 (×2): qty 1

## 2016-04-03 MED ORDER — FUROSEMIDE 40 MG PO TABS
40.0000 mg | ORAL_TABLET | Freq: Every day | ORAL | Status: DC
  Administered 2016-04-04: 40 mg via ORAL
  Filled 2016-04-03: qty 1

## 2016-04-03 NOTE — Progress Notes (Signed)
ANTICOAGULATION CONSULT NOTE - Follow Up Consult  Pharmacy Consult for Coumadin and Lovenox Indication: apical thrombus  Allergies  Allergen Reactions  . Hydrocodone Nausea Only    Patient Measurements: Height: 5\' 11"  (180.3 cm) Weight: 242 lb 9.6 oz (110 kg) IBW/kg (Calculated) : 75.3  Vital Signs: Temp: 98.1 F (36.7 C) (12/30 0631) Temp Source: Oral (12/30 0631) BP: 97/64 (12/30 0631) Pulse Rate: 93 (12/30 0631)  Labs:  Recent Labs  03/31/16 0840  04/01/16 0358 04/02/16 0326 04/03/16 0402  HGB  --   < > 16.0 15.9 15.5  HCT  --   --  45.3 44.7 43.7  PLT  --   --  284 290 266  LABPROT  --   --   --  14.2 13.8  INR  --   --   --  1.10 1.06  CREATININE  --   --  1.54* 1.51* 1.63*  TROPONINI 0.03*  --   --   --   --   < > = values in this interval not displayed.  Estimated Creatinine Clearance: 79 mL/min (by C-G formula based on SCr of 1.63 mg/dL (H)).  Assessment: 35yom started on full dose lovenox for new apical thrombus. He is now s/p cath, lovenox resumed and warfarin started. INR 1.06. CBC stable. No issues noted.   Goal of Therapy:  INR 2-3 Monitor platelets by anticoagulation protocol: Yes   Plan:  1) Coumadin 10mg  x 1 2) Lovenox 110mg  sq q12 4) Daily INR, CBC q72h at least 5) Monitor for S&S of bleed  Sandi Carne, PharmD, BCPS Pharmacy Resident Pager: [phone] 04/03/2016,7:49 AM

## 2016-04-03 NOTE — Progress Notes (Signed)
Patient ID: JERAMYAH GOODPASTURE, male   DOB: March 03, 1981, 35 y.o.   MRN: [id number]   SUBJECTIVE: Breathing better.  Not orthopneic. Weight down.   Echo: Severe LV dilation with EF 10-15%, restrictive diastolic function, LV thrombus noted, moderate MR, PASP 47 mmHg, moderately dilated and dysfunctional RV.   LHC/RHC: Hemodynamics (mmHg) RA mean 8 RV 42/13 PA 41/22, mean 32 PCWP mean 20 AO 91/78 Oxygen saturations: PA 60% AO 97% Cardiac Output (Fick) 3.8  Cardiac Index (Fick) 1.7 No coronary disease noted.   Scheduled Meds: . coumadin book   Does not apply Once  . digoxin  0.25 mg Oral Daily  . enoxaparin (LOVENOX) injection  110 mg Subcutaneous Q12H  . [START ON 04/04/2016] furosemide  40 mg Oral Daily  . isosorbide-hydrALAZINE  0.5 tablet Oral TID  . losartan  25 mg Oral Daily  . sodium chloride flush  3 mL Intravenous Q12H  . sodium chloride flush  3 mL Intravenous Q12H  . sodium chloride flush  3 mL Intravenous Q12H  . spironolactone  25 mg Oral Daily  . warfarin  10 mg Oral ONCE-1800  . warfarin   Does not apply Once  . Warfarin - Pharmacist Dosing Inpatient   Does not apply q1800   Continuous Infusions:  PRN Meds:.sodium chloride, sodium chloride, sodium chloride, acetaminophen, ondansetron (ZOFRAN) IV, sodium chloride flush, sodium chloride flush, sodium chloride flush    Vitals:   04/02/16 1555 04/02/16 1630 04/02/16 2015 04/03/16 0631  BP: 94/62 94/62 (!) 90/52 97/64  Pulse: 88 88 97 93  Resp:  16 18 19   Temp:   98.5 F (36.9 C) 98.1 F (36.7 C)  TempSrc:   Oral Oral  SpO2: 98% 97% 96% 96%  Weight:    242 lb 9.6 oz (110 kg)  Height:        Intake/Output Summary (Last 24 hours) at 04/03/16 0858 Last data filed at 04/03/16 4098  Gross per 24 hour  Intake              720 ml  Output             1951 ml  Net            -1231 ml    LABS: Basic Metabolic Panel:  Recent Labs  11/91/47 0358 04/02/16 0326 04/03/16 0402  NA 138 138 138  K 4.0 3.8 3.9  CL  105 103 104  CO2 25 26 25   GLUCOSE 98 93 83  BUN 16 16 17   CREATININE 1.54* 1.51* 1.63*  CALCIUM 8.8* 8.8* 8.7*  MG 2.1  --   --    Liver Function Tests: No results for input(s): AST, ALT, ALKPHOS, BILITOT, PROT, ALBUMIN in the last 72 hours. No results for input(s): LIPASE, AMYLASE in the last 72 hours. CBC:  Recent Labs  04/02/16 0326 04/03/16 0402  WBC 4.2 4.5  HGB 15.9 15.5  HCT 44.7 43.7  MCV 82.5 82.6  PLT 290 266   Cardiac Enzymes: No results for input(s): CKTOTAL, CKMB, CKMBINDEX, TROPONINI in the last 72 hours. BNP: Invalid input(s): POCBNP D-Dimer: No results for input(s): DDIMER in the last 72 hours. Hemoglobin A1C:  Recent Labs  04/01/16 0358  HGBA1C 5.7*   Fasting Lipid Panel:  Recent Labs  04/01/16 0358  CHOL 199  HDL 31*  LDLCALC 146*  TRIG 111  CHOLHDL 6.4   Thyroid Function Tests:  Recent Labs  04/03/16 0402  TSH 1.979   Anemia Panel: No results  for input(s): VITAMINB12, FOLATE, FERRITIN, TIBC, IRON, RETICCTPCT in the last 72 hours.  RADIOLOGY: Dg Chest 2 View  Result Date: 03/30/2016 CLINICAL DATA:  Two-day history of right-sided chest pain and nonproductive cough for 3 weeks. EXAM: CHEST  2 VIEW COMPARISON:  02/12/2013 FINDINGS: Cardiopericardial silhouette is upper normal to mildly enlarged. Lung volumes are low. No pulmonary edema or pleural effusion. The visualized bony structures of the thorax are intact. IMPRESSION: Upper normal to mildly enlarged cardiopericardial silhouette. Otherwise unremarkable. Electronically Signed   By: Kennith Center M.D.   On: 03/30/2016 12:54   Ct Angio Chest Pe W And/or Wo Contrast  Result Date: 03/30/2016 CLINICAL DATA:  Shortness of breath and cough for 3 days. EXAM: CT ANGIOGRAPHY CHEST WITH CONTRAST TECHNIQUE: Multidetector CT imaging of the chest was performed using the standard protocol during bolus administration of intravenous contrast. Multiplanar CT image reconstructions and MIPs were  obtained to evaluate the vascular anatomy. CONTRAST:  100 cc Omni 300 COMPARISON:  None. FINDINGS: Cardiovascular: Mild cardiac enlargement. The main pulmonary artery is patent. No saddle embolus. No lobar or segmental pulmonary artery filling defects identified. Mediastinum/Nodes: The trachea appears patent and is midline. Normal appearance of the esophagus. Right paratracheal lymph node is prominent measuring 11 mm, image 45 of series 4. Lungs/Pleura: Bilateral pleural effusions are noted. Diffuse ground-glass attenuation is noted in both lungs. Dependent changes are noted within the posterior lower lobes. No airspace consolidation. Upper Abdomen: No acute abnormality. Musculoskeletal: No aggressive lytic or sclerotic bone lesions identified. Review of the MIP images confirms the above findings. IMPRESSION: 1. No evidence for acute pulmonary embolus. 2. Bilateral pleural effusions and diffuse ground-glass attenuation compatible with a fluid overload state. Cannot rule out CHF. Electronically Signed   By: Signa Kell M.D.   On: 03/30/2016 18:20    PHYSICAL EXAM General: NAD Neck: JVP 7 cm, no thyromegaly or thyroid nodule.  Lungs: Clear to auscultation bilaterally with normal respiratory effort. CV: Nondisplaced PMI.  Heart regular S1/S2, no S3/S4, no murmur.  No peripheral edema.  Abdomen: Soft, nontender, no hepatosplenomegaly, no distention.  Neurologic: Alert and oriented x 3.  Psych: Normal affect. Extremities: No clubbing or cyanosis.   TELEMETRY: Reviewed telemetry pt in NSR  ASSESSMENT AND PLAN: 35 yo with history of HTN presented with acute systolic CHF.  1. Acute systolic CHF:  Patient presented after about 1 month of dyspnea.  He had had right-sided, somewhat pleuritic chest pain also.  PE CT at admission did not show PE, did show pulmonary edema.  Echo with severely dilated LV, EF 10-15%, LV thrombus, moderately dilated and moderately dysfunctional RV.  Mother had CHF but developed  at older age (died in her 70s), no other family member with CHF that he knows of.  No history of lung disease/sarcoidosis.  SPEP and TSH normal.  Rare ETOH, no drugs.  Mild troponin elevation at admission, most likely due to demand ischemia from volume overload.  RHC/LHC showed no angiographic CAD and mildly elevated filling pressures.  Cardiac index was low at 1.7, digoxin started.  Today, he feels good.  No dyspnea.  No lightheadedness.  SBP soft in 90s.  On exam, he is not volume overloaded.  - Stop IV Lasix, start Lasix 40 mg po daily tomorrow.  - Continue losartan and Bidil, no BP room to titrate. - Increase spironolactone to 25 mg daily. - Hold off on beta blocker for now with low output and soft BP.  - Will need cardiac MRI  to look for infiltrative disease (myocarditis, sarcoid, etc).  Can do as outpatient.  - Continue digoxin 0.25, would send home on 0.125 daily.  - I am concerned about him.  Though he feels good, cardiac index is low.  I will use digoxin for now rather than IV inotrope but will need to follow him closely.  He may end up being a transplant candidate down the road if he does not improve.  2. AKI: Mild elevation in creatinine, may be cardiorenal in nature.  Transitioning to po Lasix.  3. LV apical thrombus: Noted by echo.  Currently on Lovenox + warfarin.  Will need coumadin clinic followup.    If he walks today and feels good and is stable on current meds, think he can go home in the morning.  He will need Lovenox overlapped with coumadin at d/c and followup in a coumadin clinic.  I have set up followup with me.   Marca AnconaDalton Brizeyda Holtmeyer 04/03/2016 8:58 AM

## 2016-04-03 NOTE — Progress Notes (Signed)
Family Medicine Teaching Service Daily Progress Note Intern Pager: [phone]  Patient name: Kenneth Robertson Medical record number: [id number] Date of birth: 02-23-1981 Age: 35 y.o. Gender: male  Primary Care Provider: [facility] Consultants: cardiology Code Status: full  Pt Overview and Major Events to Date:  12/26- admitted to FPTS 12/29- left and right heart cath  Assessment and Plan: Kenneth Robertson is a 35 y.o. male presenting with SOB. PMH is significant for HTN (never on any medications), depression, visual impairment.  New onset HFrEF. Echo demonstrating EF 10-15% with diffuse severe hypokinesis, minimal contraction preserved only in the basal and mid inferior and inferolateral walls. There is a thrombus in the apex measuring 30 x 17 mm. - cardiology following, appreciate recommendations - thrombus in apex of heart- currently transitioning patient to warfarin - diuresis per HF team- currently on IV 80mg  BID, possibly will transition to oral diuretics today.  - HF team added digoxin due to low cardiac output  - possible plans for cardiac MRI inpatient vs outpatient  - strict I&O's- net output of 3.8 - monitor on telemetry - Daily weights   HTN with hypotension on admit, BP 97/64 this morning. Has had elevated BP per chart review to 151/108 last year. Currently being diuresed and adding medications for HF diagnosis. - Monitor BP closely while optimizing HF medications and while diuresing   Possible AKI vs CKD. May be due to cardiorenal from acute heart failure. However patient creatinine has had slow increase over the last 3-4 years. On 09/17/2011 was 1.02 then increased to 1.19 in 2014, 1.38 in 2016 and on admit 1.5 > 1.54 > 1.51 > 1.63 - continue to monitor BMP, expect creatinine to increase in setting of diuresis   FEN/GI: heart healthy diet Prophylaxis: lovenox, transitioning to warfarin   Disposition: pending clinical improvement  Subjective:  Mr. Kenneth Robertson is doing  well, he is happy the cath went okay. He denies chest pain and shortness of breath. He is worried about low blood pressure but denies feeling lightheaded.   Objective: Temp:  [97.5 F (36.4 C)-98.6 F (37 C)] 98.1 F (36.7 C) (12/30 0631) Pulse Rate:  [0-101] 93 (12/30 0631) Resp:  [0-24] 19 (12/30 0631) BP: (90-124)/(52-81) 97/64 (12/30 0631) SpO2:  [0 %-100 %] 96 % (12/30 0631) Weight:  [242 lb 9.6 oz (110 kg)] 242 lb 9.6 oz (110 kg) (12/30 0631) Physical Exam: General: sitting up in bedside char, in NAD Cardiovascular: RRR no MRG Respiratory: CTA bilaterally no increased work of breathing Abdomen: soft, non-tender, non-distended, +BS Extremities: no edema or cyanosis  Laboratory:  Recent Labs Lab 04/01/16 0358 04/02/16 0326 04/03/16 0402  WBC 4.4 4.2 4.5  HGB 16.0 15.9 15.5  HCT 45.3 44.7 43.7  PLT 284 290 266    Recent Labs Lab 04/01/16 0358 04/02/16 0326 04/03/16 0402  NA 138 138 138  K 4.0 3.8 3.9  CL 105 103 104  CO2 25 26 25   BUN 16 16 17   CREATININE 1.54* 1.51* 1.63*  CALCIUM 8.8* 8.8* 8.7*  GLUCOSE 98 93 83   BNP 477.3 lipid panel total chol 199- tri 111, LDL 146, HDL 31. ASCVD risk 2.9% troponin trend 0.03 > 0.04 > 0.03 TSH 1.979 A1C 5.7 SPEP without monoclonal spike   Imaging/Diagnostic Tests: No results found. Echo- - Severely dilated left ventricle with severely decreased LVEF   estimated at 10-15%, There is diffuse severe hypokinesis with   minimal contraction preserved only in the basal and mid inferior  and inferolateral walls.   There is a thrombus in the apex measuring 30 x 17 mm.   RVEF is moderately decreased.   Moderate pulmonary hypertension.  Left and Right Heart Cath- 1. Normal coronaries, nonischemic cardiomyopathy.  2. Mildly elevated left and right heart filling pressures.  3. Low cardiac output.   Tillman SersAngela C Remijio Holleran, DO 04/03/2016, 8:00 AM PGY-1, [facility] FPTS Intern pager: [phone], text pages  welcome

## 2016-04-03 NOTE — Progress Notes (Signed)
Patient with no complaints during 7 a to 7 p shift other than he is ready to go home.  Denies shortness of breath, chest pain or other.  States he knows what to do for his CHF.  Nurse did discuss use of coumadin with patient, precautions and how to take.

## 2016-04-04 DIAGNOSIS — R0609 Other forms of dyspnea: Secondary | ICD-10-CM

## 2016-04-04 LAB — BASIC METABOLIC PANEL
Anion gap: 7 (ref 5–15)
BUN: 16 mg/dL (ref 6–20)
CALCIUM: 8.8 mg/dL — AB (ref 8.9–10.3)
CO2: 28 mmol/L (ref 22–32)
CREATININE: 1.6 mg/dL — AB (ref 0.61–1.24)
Chloride: 102 mmol/L (ref 101–111)
GFR calc Af Amer: >60 mL/min (ref >60)
GFR calc non Af Amer: 54 mL/min — ABNORMAL LOW (ref >60)
Glucose, Bld: 93 mg/dL (ref 65–99)
Potassium: 4.2 mmol/L (ref 3.5–5.1)
SODIUM: 137 mmol/L (ref 135–145)

## 2016-04-04 LAB — CBC
HEMATOCRIT: 44.3 % (ref 39.0–52.0)
HEMOGLOBIN: 15.6 g/dL (ref 13.0–17.0)
MCH: 28.9 pg (ref 26.0–34.0)
MCHC: 35.2 g/dL (ref 30.0–36.0)
MCV: 82.2 fL (ref 78.0–100.0)
Platelets: 264 10*3/uL (ref 150–400)
RBC: 5.39 MIL/uL (ref 4.22–5.81)
RDW: 13.2 % (ref 11.5–15.5)
WBC: 4.9 10*3/uL (ref 4.0–10.5)

## 2016-04-04 LAB — PROTIME-INR
INR: 1.07
Prothrombin Time: 13.9 seconds (ref 11.4–15.2)

## 2016-04-04 LAB — BRAIN NATRIURETIC PEPTIDE: B Natriuretic Peptide: 243 pg/mL — ABNORMAL HIGH (ref 0.0–100.0)

## 2016-04-04 MED ORDER — LOSARTAN POTASSIUM 25 MG PO TABS: 25.0000 mg | ORAL_TABLET | Freq: Every day | ORAL | 0 refills | Status: DC

## 2016-04-04 MED ORDER — COUMADIN BOOK: 1.0000 | Freq: Once | 0 refills | Status: AC

## 2016-04-04 MED ORDER — SPIRONOLACTONE 25 MG PO TABS: 25.0000 mg | ORAL_TABLET | Freq: Every day | ORAL | 0 refills | Status: DC

## 2016-04-04 MED ORDER — DIGOXIN 125 MCG PO TABS: 0.1250 mg | ORAL_TABLET | Freq: Every day | ORAL | 0 refills | Status: DC

## 2016-04-04 MED ORDER — WARFARIN SODIUM 5 MG PO TABS: 10.0000 mg | ORAL_TABLET | Freq: Every day | ORAL | 0 refills | Status: DC

## 2016-04-04 MED ORDER — ISOSORB DINITRATE-HYDRALAZINE 20-37.5 MG PO TABS: 0.5000 | ORAL_TABLET | Freq: Three times a day (TID) | ORAL | 0 refills | Status: DC

## 2016-04-04 MED ORDER — DIGOXIN 125 MCG PO TABS
0.1250 mg | ORAL_TABLET | Freq: Every day | ORAL | Status: DC
  Administered 2016-04-04: 0.125 mg via ORAL
  Filled 2016-04-04: qty 1

## 2016-04-04 MED ORDER — ENOXAPARIN SODIUM 100 MG/ML ~~LOC~~ SOLN: 110.0000 mg | Freq: Two times a day (BID) | SUBCUTANEOUS | 0 refills | Status: DC

## 2016-04-04 MED ORDER — FUROSEMIDE 40 MG PO TABS: 40.0000 mg | ORAL_TABLET | Freq: Every day | ORAL | 0 refills | Status: DC

## 2016-04-04 MED ORDER — WARFARIN SODIUM 10 MG PO TABS: 10.0000 mg | ORAL_TABLET | Freq: Once | ORAL | Status: DC

## 2016-04-04 NOTE — Progress Notes (Signed)
1700 Patient apparently called front desk and spoke to secretary stating he could not afford his medications.  This message was relayed to RN who attempted to contact patient and got no answer at either phone number listed in chart for him.

## 2016-04-04 NOTE — Discharge Instructions (Signed)
Please go to the lab appointment and doctors appointment on Wednesday Jan 3.   Please look at your medication list carefully.    Vitamin K Foods and Warfarin Warfarin is a blood thinner (anticoagulant). Anticoagulant medicines help prevent the formation of blood clots. These medicines work by decreasing the activity of vitamin K, which promotes normal blood clotting. When you take warfarin, problems can occur from suddenly increasing or decreasing the amount of vitamin K that you eat from one day to the next. Problems may include:  Blood clots.  Bleeding. What general guidelines do I need to follow? To avoid problems when taking warfarin:  Eat a balanced diet that includes:  Fresh fruits and vegetables.  Whole grains.  Low-fat dairy products.  Lean proteins, such as fish, eggs, and lean cuts of meat.  Keep your intake of vitamin K consistent from day to day. To do this:  Avoid eating large amounts of vitamin K one day and low amounts of vitamin K the next day.  If you take a multivitamin that contains vitamin K, be sure to take it every day.  Know which foods contain vitamin K. Use the lists below to understand serving sizes and the amount of vitamin K in one serving.  Avoid major changes in your diet. If you are going to change your diet, talk with your health care provider before making changes.  Work with a Dealernutrition specialist (dietitian) to develop a meal plan that works best for you. High vitamin K foods Foods that are high in vitamin K contain more than 100 mcg (micrograms) per serving. These include:  Broccoli (cooked) -  cup has 110 mcg.  Brussels sprouts (cooked) -  cup has 109 mcg.  Greens, beet (cooked) -  cup has 350 mcg.  Greens, collard (cooked) -  cup has 418 mcg.  Greens, turnip (cooked) -  cup has 265 mcg.  Green onions or scallions -  cup has 105 mcg.  Kale (fresh or frozen) -  cup has 531 mcg.  Parsley (raw) - 10 sprigs has 164  mcg.  Spinach (cooked) -  cup has 444 mcg.  Swiss chard (cooked) -  cup has 287 mcg. Moderate vitamin K foods Foods that have a moderate amount of vitamin K contain 25-100 mcg per serving. These include:  Asparagus (cooked) - 5 spears have 38 mcg.  Black-eyed peas (dried) -  cup has 32 mcg.  Cabbage (cooked) -  cup has 37 mcg.  Kiwi fruit - 1 medium has 31 mcg.  Lettuce - 1 cup has 57-63 mcg.  Okra (frozen) -  cup has 44 mcg.  Prunes (dried) - 5 prunes have 25 mcg.  Watercress (raw) - 1 cup has 85 mcg. Low vitamin K foods Foods low in vitamin K contain less than 25 mcg per serving. These include:  Artichoke - 1 medium has 18 mcg.  Avocado - 1 oz. has 6 mcg.  Blueberries -  cup has 14 mcg.  Cabbage (raw) -  cup has 21 mcg.  Carrots (cooked) -  cup has 11 mcg.  Cauliflower (raw) -  cup has 11 mcg.  Cucumber with peel (raw) -  cup has 9 mcg.  Grapes -  cup has 12 mcg.  Mango - 1 medium has 9 mcg.  Nuts - 1 oz. has 15 mcg.  Pear - 1 medium has 8 mcg.  Peas (cooked) -  cup has 19 mcg.  Pickles - 1 spear has 14 mcg.  Pumpkin seeds -  1 oz. has 13 mcg.  Sauerkraut (canned) -  cup has 16 mcg.  Soybeans (cooked) -  cup has 16 mcg.  Tomato (raw) - 1 medium has 10 mcg.  Tomato sauce -  cup has 17 mcg. Vitamin K-free foods If a food contain less than 5 mcg per serving, it is considered to have no vitamin K. These foods include:  Bread and cereal products.  Cheese.  Eggs.  Fish and shellfish.  Meat and poultry.  Milk and dairy products.  Sunflower seeds. Actual amounts of vitamin K in foods may be different depending on processing. Talk with your dietitian about what foods you can eat and what foods you should avoid. This information is not intended to replace advice given to you by your health care provider. Make sure you discuss any questions you have with your health care provider. Document Released: 01/17/2009 Document Revised:  10/12/2015 Document Reviewed: 06/25/2015 Elsevier Interactive Patient Education  2017 [employer].

## 2016-04-04 NOTE — Plan of Care (Signed)
Problem: Health Behavior/Discharge Planning: Goal: Ability to manage health-related needs will improve Outcome: Adequate for Discharge Stressed CHF teaching including daily weights, watching sodium and fluid intake.  Also educated on how to administer Lovenox and the importance of taking his Coumadin as prescribed at the same time daily and keeping his follow-up appointments at clinic.    Problem: Education: Goal: Ability to verbalize understanding of medication therapies will improve Outcome: Adequate for Discharge Verbalized to nurse the importance of taking his medications as prescribed and keeping his follow-up appointments.

## 2016-04-04 NOTE — Progress Notes (Signed)
Discharge instructions reviewed with patient, questions answered, verbalized understanding.  WRote out what each medication was for and what was still due today.  Again stressed to patient the importance of daily weights, watching sodium and fluid intake.  To avoid fast foods as these are high in sodium .  Patient stated he understood what to do and plans to keep followup appointment on 04/07/2016.  Patient transported to front of hospital to drive himself home accompanied by friend.

## 2016-04-04 NOTE — Progress Notes (Signed)
Patient ID: Kenneth Robertson, male   DOB: [birth year], 35 y.o.   MRN: [id number]   SUBJECTIVE: Breathing better.  Not orthopneic. Weight stable. BP stable.   Echo: Severe LV dilation with EF 10-15%, restrictive diastolic function, LV thrombus noted, moderate MR, PASP 47 mmHg, moderately dilated and dysfunctional RV.   LHC/RHC: Hemodynamics (mmHg) RA mean 8 RV 42/13 PA 41/22, mean 32 PCWP mean 20 AO 91/78 Oxygen saturations: PA 60% AO 97% Cardiac Output (Fick) 3.8  Cardiac Index (Fick) 1.7 No coronary disease noted.   Scheduled Meds: . coumadin book   Does not apply Once  . digoxin  0.25 mg Oral Daily  . enoxaparin (LOVENOX) injection  110 mg Subcutaneous Q12H  . furosemide  40 mg Oral Daily  . isosorbide-hydrALAZINE  0.5 tablet Oral TID  . losartan  25 mg Oral Daily  . sodium chloride flush  3 mL Intravenous Q12H  . spironolactone  25 mg Oral Daily  . warfarin   Does not apply Once  . Warfarin - Pharmacist Dosing Inpatient   Does not apply q1800   Continuous Infusions:  PRN Meds:.sodium chloride, acetaminophen, ondansetron (ZOFRAN) IV, sodium chloride flush    Vitals:   04/03/16 1200 04/03/16 1613 04/03/16 2004 04/04/16 0450  BP: 106/66 114/85 (!) 116/92 122/89  Pulse: 90 100 (!) 105 92  Resp: 20  18 18   Temp: 97.8 F (36.6 C)  98 F (36.7 C) 97.6 F (36.4 C)  TempSrc: Oral  Oral Oral  SpO2: 100%  100% 100%  Weight:    243 lb 3.2 oz (110.3 kg)  Height:        Intake/Output Summary (Last 24 hours) at 04/04/16 0904 Last data filed at 04/04/16 0300  Gross per 24 hour  Intake              700 ml  Output             1651 ml  Net             -951 ml    LABS: Basic Metabolic Panel:  Recent Labs  48/25/00 0402 04/04/16 0246  NA 138 137  K 3.9 4.2  CL 104 102  CO2 25 28  GLUCOSE 83 93  BUN 17 16  CREATININE 1.63* 1.60*  CALCIUM 8.7* 8.8*   Liver Function Tests: No results for input(s): AST, ALT, ALKPHOS, BILITOT, PROT, ALBUMIN in the last 72 hours. No  results for input(s): LIPASE, AMYLASE in the last 72 hours. CBC:  Recent Labs  04/03/16 0402 04/04/16 0246  WBC 4.5 4.9  HGB 15.5 15.6  HCT 43.7 44.3  MCV 82.6 82.2  PLT 266 264   Cardiac Enzymes: No results for input(s): CKTOTAL, CKMB, CKMBINDEX, TROPONINI in the last 72 hours. BNP: Invalid input(s): POCBNP D-Dimer: No results for input(s): DDIMER in the last 72 hours. Hemoglobin A1C: No results for input(s): HGBA1C in the last 72 hours. Fasting Lipid Panel: No results for input(s): CHOL, HDL, LDLCALC, TRIG, CHOLHDL, LDLDIRECT in the last 72 hours. Thyroid Function Tests:  Recent Labs  04/03/16 0402  TSH 1.979   Anemia Panel: No results for input(s): VITAMINB12, FOLATE, FERRITIN, TIBC, IRON, RETICCTPCT in the last 72 hours.  RADIOLOGY: Dg Chest 2 View  Result Date: 03/30/2016 CLINICAL DATA:  Two-day history of right-sided chest pain and nonproductive cough for 3 weeks. EXAM: CHEST  2 VIEW COMPARISON:  02/12/2013 FINDINGS: Cardiopericardial silhouette is upper normal to mildly enlarged. Lung volumes are low. No pulmonary edema  or pleural effusion. The visualized bony structures of the thorax are intact. IMPRESSION: Upper normal to mildly enlarged cardiopericardial silhouette. Otherwise unremarkable. Electronically Signed   By: Kennith Center M.D.   On: 03/30/2016 12:54   Ct Angio Chest Pe W And/or Wo Contrast  Result Date: 03/30/2016 CLINICAL DATA:  Shortness of breath and cough for 3 days. EXAM: CT ANGIOGRAPHY CHEST WITH CONTRAST TECHNIQUE: Multidetector CT imaging of the chest was performed using the standard protocol during bolus administration of intravenous contrast. Multiplanar CT image reconstructions and MIPs were obtained to evaluate the vascular anatomy. CONTRAST:  100 cc Omni 300 COMPARISON:  None. FINDINGS: Cardiovascular: Mild cardiac enlargement. The main pulmonary artery is patent. No saddle embolus. No lobar or segmental pulmonary artery filling defects  identified. Mediastinum/Nodes: The trachea appears patent and is midline. Normal appearance of the esophagus. Right paratracheal lymph node is prominent measuring 11 mm, image 45 of series 4. Lungs/Pleura: Bilateral pleural effusions are noted. Diffuse ground-glass attenuation is noted in both lungs. Dependent changes are noted within the posterior lower lobes. No airspace consolidation. Upper Abdomen: No acute abnormality. Musculoskeletal: No aggressive lytic or sclerotic bone lesions identified. Review of the MIP images confirms the above findings. IMPRESSION: 1. No evidence for acute pulmonary embolus. 2. Bilateral pleural effusions and diffuse ground-glass attenuation compatible with a fluid overload state. Cannot rule out CHF. Electronically Signed   By: Signa Kell M.D.   On: 03/30/2016 18:20    PHYSICAL EXAM General: NAD Neck: JVP 7 cm, no thyromegaly or thyroid nodule.  Lungs: Clear to auscultation bilaterally with normal respiratory effort. CV: Nondisplaced PMI.  Heart regular S1/S2, no S3/S4, no murmur.  No peripheral edema.  Abdomen: Soft, nontender, no hepatosplenomegaly, no distention.  Neurologic: Alert and oriented x 3.  Psych: Normal affect. Extremities: No clubbing or cyanosis.   TELEMETRY: Reviewed telemetry pt in NSR  ASSESSMENT AND PLAN: 35 yo with history of HTN presented with acute systolic CHF.  1. Acute systolic CHF:  Patient presented after about 1 month of dyspnea.  He had had right-sided, somewhat pleuritic chest pain also.  PE CT at admission did not show PE, did show pulmonary edema.  Echo with severely dilated LV, EF 10-15%, LV thrombus, moderately dilated and moderately dysfunctional RV.  Mother had CHF but developed at older age (died in her 80s), no other family member with CHF that he knows of.  No history of lung disease/sarcoidosis.  SPEP and TSH normal.  Rare ETOH, no drugs.  Mild troponin elevation at admission, most likely due to demand ischemia from volume  overload.  RHC/LHC showed no angiographic CAD and mildly elevated filling pressures.  Cardiac index was low at 1.7, digoxin started.  Today, he feels good.  No dyspnea.  No lightheadedness.  BP better.  On exam, he is not volume overloaded.  - Continue Lasix 40 mg daily.  - Continue losartan and Bidil at current doses. - Continue spironolactone 25 mg daily. - Hold off on beta blocker for now with low output and soft BP, if BP/symptoms stable, will start at cardiology followup.  - Will need cardiac MRI to look for infiltrative disease (myocarditis, sarcoid, etc).  Can do as outpatient.  - Continue digoxin 0.25, would send home on 0.125 daily.  - I am concerned about him.  Though he feels good, cardiac index is low.  I will use digoxin for now rather than IV inotrope but will need to follow him closely.  He may end up being  a transplant candidate down the road if he does not improve.  2. AKI: Stable creatinine.  Transitioned to po Lasix.  3. LV apical thrombus: Noted by echo.  Currently on Lovenox + warfarin.  Will need coumadin clinic followup.   4. Disposition: Home today.  Needs CHF clinic followup.  Needs coumadin clinic followup (can do at family medicine center).  Cardiac meds for home: Lovenox bid until INR > 2, warfarin, digoxin 0.125 daily, spironolactone 25 daily, Lasix 40 daily, losartan 25 daily, Bidil 0.5 tab tid.   Marca AnconaDalton Pola Furno 04/04/2016 9:04 AM

## 2016-04-04 NOTE — Progress Notes (Signed)
Patient very adamant that he was told he could leave the hospital at 8 a.m. And he is ready to go.  I contacted the family medicine group who stated someone would be here to see him shortly.  Patient said he would give them until 9:30 then would leave AMA.  RN also contacted case manager to see what needs to be done to set him up for Lovenox injections at home.

## 2016-04-04 NOTE — Progress Notes (Signed)
ANTICOAGULATION CONSULT NOTE - Follow Up Consult  Pharmacy Consult for Coumadin and Lovenox Indication: apical thrombus  Allergies  Allergen Reactions  . Hydrocodone Nausea Only    Patient Measurements: Height: 5\' 11"  (180.3 cm) Weight: 243 lb 3.2 oz (110.3 kg) (scale c) IBW/kg (Calculated) : 75.3  Vital Signs: Temp: 97.6 F (36.4 C) (12/31 0450) Temp Source: Oral (12/31 0450) BP: 117/80 (12/31 0931) Pulse Rate: 92 (12/31 0450)  Labs:  Recent Labs  04/02/16 0326 04/03/16 0402 04/04/16 0246  HGB 15.9 15.5 15.6  HCT 44.7 43.7 44.3  PLT 290 266 264  LABPROT 14.2 13.8 13.9  INR 1.10 1.06 1.07  CREATININE 1.51* 1.63* 1.60*    Estimated Creatinine Clearance: 80.6 mL/min (by C-G formula based on SCr of 1.6 mg/dL (H)).  Assessment: 35yom started on full dose lovenox for new apical thrombus. He is now s/p cath, lovenox resumed and warfarin started. INR 1.07. CBC stable. No issues noted.   Goal of Therapy:  INR 2-3 Monitor platelets by anticoagulation protocol: Yes   Plan:  1) Coumadin 10mg  x 1 2) Lovenox 110mg  sq q12 4) Daily INR, CBC q72h at least 5) Monitor for S&S of bleed 6) Home today - INR follow up on 1/3 per treatment team  Sandi Carne, PharmD, BCPS Pharmacy Resident Pager: [phone] 04/04/2016,11:01 AM

## 2016-04-04 NOTE — Progress Notes (Signed)
Call placed to case management regarding patients Lovenox.  Per CM patient has good insurance and there should be no issue with him getting his Lovenox.  Gave patient this information, [facility]  Patient did administer his own Lovenox this morning and we discussed the importance of him taking this medication as prescribed until he follows up in the clinic on 04/07/2016.

## 2016-04-07 ENCOUNTER — Ambulatory Visit (INDEPENDENT_AMBULATORY_CARE_PROVIDER_SITE_OTHER): Payer: Medicare Other | Admitting: Family Medicine

## 2016-04-07 ENCOUNTER — Other Ambulatory Visit (INDEPENDENT_AMBULATORY_CARE_PROVIDER_SITE_OTHER): Payer: Medicare Other

## 2016-04-07 ENCOUNTER — Encounter: Payer: Self-pay | Admitting: Family Medicine

## 2016-04-07 VITALS — BP 104/72 | HR 97 | Temp 98.4°F | Ht 71.0 in | Wt 244.0 lb

## 2016-04-07 DIAGNOSIS — I513 Intracardiac thrombosis, not elsewhere classified: Secondary | ICD-10-CM | POA: Diagnosis not present

## 2016-04-07 DIAGNOSIS — I509 Heart failure, unspecified: Secondary | ICD-10-CM | POA: Diagnosis not present

## 2016-04-07 DIAGNOSIS — I5041 Acute combined systolic (congestive) and diastolic (congestive) heart failure: Secondary | ICD-10-CM

## 2016-04-07 HISTORY — DX: Intracardiac thrombosis, not elsewhere classified: I51.3

## 2016-04-07 LAB — POCT INR: INR: 1.3

## 2016-04-07 NOTE — Assessment & Plan Note (Signed)
INR 1.3 today (up from discharge) but still not therapuetic range. Will have pt continue coumadin 10mg  daily (has not taken today yet) and follow up on 1/5 prior to the weekend. Advised to continue with lovenox BID (has 7 pens left). Patient may need refill on Lovenox if subtherapeutic on 1/5.

## 2016-04-07 NOTE — Progress Notes (Signed)
Subjective: CC: hospital follow up HPI: Patient is a 36 y.o. male with a recent hospitalization for SOB found to have new diagnosis of systolic CHF with an EF 10-15%, LV thrombus, presenting to clinic today for a hospital follow up. Of note, part way through the appointment as we discuss his medications (including sertraline, he notes that he had issues with this and needed to discuss it with his PCP as he self- discontinued). He was under the impression that I was the heart doctor. After a long discussion, he believes he would like to transfer his care to [place].   Since his hospitalization, he's been doing much better.  He didn't take his medications this AM including his coumadin. He notes he had some GI upset (nuasea) with taking all the pills at once so he just stopped.  He hasn't gotten BiDil filled because it wasn't covered by insurance. He has all his other medications. He typically does the lovenox injections in the morning after breakfast and at bedtime, but notes that he hasn't done it today. He couldn't tell me how much the BiDil would be without insurance coverage but states all his other medications combined were $14. His friend accompanied him to asked about switching to coreg since the BiDil was not covered.  Currently, no SOB, swelling, chest pain. Sleeps on 1 pillow. No PND. Notes he's sleeping much better. No orthostatic hypotension.  Weights at home are 243lb in the AM. Notes he weighs himself regularly.    Social History: never smoker  ROS: All other systems reviewed and are negative.  Past Medical History Patient Active Problem List   Diagnosis Date Noted  . Thrombus in heart chamber 04/07/2016  . Dyspnea on exertion   . Restrictive heart disease   . Acute systolic heart failure (HCC)   . New onset of congestive heart failure (HCC)   . Acute CHF (congestive heart failure) (HCC) 03/30/2016    Medications- reviewed and updated  Objective: Office vital signs  reviewed. BP 104/72   Pulse 97   Temp 98.4 F (36.9 C) (Oral)   Ht 5\' 11"  (1.803 m)   Wt 244 lb (110.7 kg)   BMI 34.03 kg/m    Physical Examination:  General: Awake, alert, well- nourished, NAD, pleasant Cardio: RRR, no m/r/g noted. No JVD noted. No pitting edema in the LEs. No hepatojugular reflux.  Pulm: No increased WOB.  CTAB, without wheezes, rhonchi or crackles noted.   INR 1.3  Assessment/Plan: Thrombus in heart chamber INR 1.3 today (up from discharge) but still not therapuetic range. Will have pt continue coumadin 10mg  daily (has not taken today yet) and follow up on 1/5 prior to the weekend. Advised to continue with lovenox BID (has 7 pens left). Patient may need refill on Lovenox if subtherapeutic on 1/5.   Acute CHF (congestive heart failure) (HCC) Symptoms much improved since hospitalization. Appears to be euvolemic on exam. We discussed importance of medication compliance given his severe cardiac disease. We discussed possible options if his heart function does not improve with medication which is why we optimize pharmacologic interventions.  Advised him to f/u with pharmacy to see about how much BiDiL is. Discussed that heart failure clinic has pharmacists who can often help with obtaining medications.  It looks like from advanced HF notes, they wanted to hold off on BB for now given low output and soft BP, therefore will not start that now, especially as he has not taken any of his BPs  meds today and his SBP was still 104. No symptoms of orthostatic hypotension. - BMET today. Will also get a digoxin level.  - will defer to AHF teams on BB vs BiDiL as he has an appt on 1/11.  - continue spironolactone, losartan, lasix, and digoxin. Discussed which of these he could take at night given he did not want to take so many medications all at once.  - continue daily weights. - pt needs an actual appt to establish care with a PCP in our clinic.    Orders Placed This Encounter    Procedures  . Digoxin level  . Basic Metabolic Panel    No orders of the defined types were placed in this encounter.   Joanna Puff PGY-3, [facility]

## 2016-04-07 NOTE — Patient Instructions (Addendum)
Continue with the Lovenox for now in addition to coumadin 10mg . Follow up on Friday, 1/5, for another INR check. Take Digoxin, Lasix, and coumadin in the morning. Take losartan at night. BiDil should be three times per day (when you can get it).  Continue to check your weight daily.  Follow up for an INR check in 2 days.

## 2016-04-07 NOTE — Assessment & Plan Note (Addendum)
Symptoms much improved since hospitalization. Appears to be euvolemic on exam. We discussed importance of medication compliance given his severe cardiac disease. We discussed possible options if his heart function does not improve with medication which is why we optimize pharmacologic interventions.  Advised him to f/u with pharmacy to see about how much BiDiL is. Discussed that heart failure clinic has pharmacists who can often help with obtaining medications.  It looks like from advanced HF notes, they wanted to hold off on BB for now given low output and soft BP, therefore will not start that now, especially as he has not taken any of his BPs meds today and his SBP was still 104. No symptoms of orthostatic hypotension. - BMET today. Will also get a digoxin level.  - will defer to AHF teams on BB vs BiDiL as he has an appt on 1/11.  - continue spironolactone, losartan, lasix, and digoxin. Discussed which of these he could take at night given he did not want to take so many medications all at once.  - continue daily weights. - pt needs an actual appt to establish care with a PCP in our clinic.

## 2016-04-08 LAB — BASIC METABOLIC PANEL
BUN: 15 mg/dL (ref 7–25)
CHLORIDE: 106 mmol/L (ref 98–110)
CO2: 24 mmol/L (ref 20–31)
Calcium: 9 mg/dL (ref 8.6–10.3)
Creat: 1.47 mg/dL — ABNORMAL HIGH (ref 0.60–1.35)
Glucose, Bld: 104 mg/dL — ABNORMAL HIGH (ref 65–99)
POTASSIUM: 4.3 mmol/L (ref 3.5–5.3)
SODIUM: 141 mmol/L (ref 135–146)

## 2016-04-08 LAB — DIGOXIN LEVEL: DIGOXIN LVL: 0.5 ug/L — AB (ref 0.8–2.0)

## 2016-04-09 ENCOUNTER — Other Ambulatory Visit (INDEPENDENT_AMBULATORY_CARE_PROVIDER_SITE_OTHER): Payer: Medicare Other | Admitting: *Deleted

## 2016-04-09 ENCOUNTER — Encounter: Payer: Self-pay | Admitting: Family Medicine

## 2016-04-09 ENCOUNTER — Telehealth: Payer: Self-pay | Admitting: *Deleted

## 2016-04-09 DIAGNOSIS — I5041 Acute combined systolic (congestive) and diastolic (congestive) heart failure: Secondary | ICD-10-CM | POA: Diagnosis present

## 2016-04-09 LAB — POCT INR: INR: 1.5

## 2016-04-09 NOTE — Telephone Encounter (Signed)
Spoke with patient to clarify the size coumadin tablets he has and the correct dosing instructions with him. Kenneth Robertson, Rodena Medin

## 2016-04-09 NOTE — Telephone Encounter (Signed)
Left message to return my call. I need to speak with him about his coumadin dosing. Busick, Rodena Medin

## 2016-04-12 ENCOUNTER — Ambulatory Visit (INDEPENDENT_AMBULATORY_CARE_PROVIDER_SITE_OTHER): Payer: Medicare Other | Admitting: *Deleted

## 2016-04-12 ENCOUNTER — Ambulatory Visit (HOSPITAL_BASED_OUTPATIENT_CLINIC_OR_DEPARTMENT_OTHER): Payer: Medicare Other | Attending: Otolaryngology

## 2016-04-12 DIAGNOSIS — I5041 Acute combined systolic (congestive) and diastolic (congestive) heart failure: Secondary | ICD-10-CM

## 2016-04-12 LAB — POCT INR: INR: 3.6

## 2016-04-15 ENCOUNTER — Ambulatory Visit (INDEPENDENT_AMBULATORY_CARE_PROVIDER_SITE_OTHER): Payer: Medicare Other | Admitting: *Deleted

## 2016-04-15 ENCOUNTER — Ambulatory Visit (HOSPITAL_COMMUNITY)
Admit: 2016-04-15 | Discharge: 2016-04-15 | Disposition: A | Discharge status: Home / Self Care | Payer: Medicare Other | Attending: Cardiology | Admitting: Cardiology

## 2016-04-15 ENCOUNTER — Encounter (HOSPITAL_COMMUNITY): Payer: Self-pay

## 2016-04-15 VITALS — BP 132/80 | HR 91 | Wt 250.2 lb

## 2016-04-15 DIAGNOSIS — I5022 Chronic systolic (congestive) heart failure: Secondary | ICD-10-CM | POA: Insufficient documentation

## 2016-04-15 DIAGNOSIS — F329 Major depressive disorder, single episode, unspecified: Secondary | ICD-10-CM | POA: Diagnosis not present

## 2016-04-15 DIAGNOSIS — I429 Cardiomyopathy, unspecified: Secondary | ICD-10-CM | POA: Diagnosis not present

## 2016-04-15 DIAGNOSIS — Z7901 Long term (current) use of anticoagulants: Secondary | ICD-10-CM | POA: Insufficient documentation

## 2016-04-15 DIAGNOSIS — I513 Intracardiac thrombosis, not elsewhere classified: Secondary | ICD-10-CM

## 2016-04-15 DIAGNOSIS — I509 Heart failure, unspecified: Secondary | ICD-10-CM | POA: Diagnosis not present

## 2016-04-15 DIAGNOSIS — I110 Hypertensive heart disease with heart failure: Secondary | ICD-10-CM | POA: Diagnosis not present

## 2016-04-15 DIAGNOSIS — I240 Acute coronary thrombosis not resulting in myocardial infarction: Secondary | ICD-10-CM

## 2016-04-15 DIAGNOSIS — I5041 Acute combined systolic (congestive) and diastolic (congestive) heart failure: Secondary | ICD-10-CM | POA: Diagnosis present

## 2016-04-15 LAB — BRAIN NATRIURETIC PEPTIDE: B Natriuretic Peptide: 504.1 pg/mL — ABNORMAL HIGH (ref 0.0–100.0)

## 2016-04-15 LAB — BASIC METABOLIC PANEL
Anion gap: 7 (ref 5–15)
BUN: 17 mg/dL (ref 6–20)
CALCIUM: 9.2 mg/dL (ref 8.9–10.3)
CO2: 24 mmol/L (ref 22–32)
CREATININE: 1.39 mg/dL — AB (ref 0.61–1.24)
Chloride: 106 mmol/L (ref 101–111)
GFR calc Af Amer: >60 mL/min (ref >60)
GLUCOSE: 108 mg/dL — AB (ref 65–99)
Potassium: 4.6 mmol/L (ref 3.5–5.1)
SODIUM: 137 mmol/L (ref 135–145)

## 2016-04-15 LAB — POCT INR: INR: 4

## 2016-04-15 LAB — DIGOXIN LEVEL: DIGOXIN LVL: 0.4 ng/mL — AB (ref 0.8–2.0)

## 2016-04-15 MED ORDER — POTASSIUM CHLORIDE ER 20 MEQ PO TBCR
20.0000 meq | EXTENDED_RELEASE_TABLET | Freq: Every day | ORAL | 3 refills | Status: DC
Start: 2016-04-15 — End: 2016-05-11

## 2016-04-15 MED ORDER — FUROSEMIDE 40 MG PO TABS: 40.0000 mg | ORAL_TABLET | Freq: Two times a day (BID) | ORAL | 3 refills | Status: DC

## 2016-04-15 MED ORDER — ISOSORB DINITRATE-HYDRALAZINE 20-37.5 MG PO TABS: 0.5000 | ORAL_TABLET | Freq: Three times a day (TID) | ORAL | 3 refills | Status: DC

## 2016-04-15 NOTE — Progress Notes (Signed)
PCP: Dr. Leonides Schanz Cardiology: Dr. [name]  36 yo with history of HTN and newly-diagnosed nonischemic cardiomyopathy presents for CHF clinic followup.  He was admitted in 12/17 after 1 month of worsening dyspnea.  He was volume overloaded on exam with EF 10-15% on echo.  He was diuresed and RHC/LHC done.  This showed no CAD. Cardiac output was low.    Since getting home, he has gained weight.  He was unable to get Bidil due to cost.  He is taking his other meds.  He is short of breath after walking about 75 yards.  Not very active since getting home.  No orthopnea/PND. No chest pain.  No lightheadedness/syncope.  No palpitations.    ECG: NSR, right axis deviation, nonspecific T wave changes.  Labs (1/18): K 4.3, creatinine 1.47, BNP 243, TSH normal, HIV negative, SPEP negative  PMH: 1. HTN 2. Depression 3. Chronic systolic CHF: Nonischemic cardiomyopathy, diagnosed in 12/17.  - Echo (12/17) with EF 10-15%, LV thrombus, moderate MR, PASP 47 mmHg, moderately dilated and dysfunctional RV.  - LHC/RHC (12/17): No CAD; mean RA 8, PA 41/22, mean PCWP 20, CI 1.7.  - HIV, SPEP, TSH negative.  4. LV thrombus: On warfarin.   SH: On disability, nonsmoker, no ETOH/drugs.  FH: Mother with CHF but developed later in life (around 45).  No sudden cardiac death.   ROS: All systems reviewed and negative except as pre HPI.   Current Outpatient Prescriptions  Medication Sig Dispense Refill  . digoxin (LANOXIN) 0.125 MG tablet Take 1 tablet (0.125 mg total) by mouth daily. 30 tablet 0  . furosemide (LASIX) 40 MG tablet Take 1 tablet (40 mg total) by mouth 2 (two) times daily. 60 tablet 3  . losartan (COZAAR) 25 MG tablet Take 1 tablet (25 mg total) by mouth daily. 30 tablet 0  . spironolactone (ALDACTONE) 25 MG tablet Take 1 tablet (25 mg total) by mouth daily. 30 tablet 0  . warfarin (COUMADIN) 5 MG tablet Take 2 tablets (10 mg total) by mouth daily. 30 tablet 0  . isosorbide-hydrALAZINE (BIDIL) 20-37.5 MG  tablet Take 0.5 tablets by mouth 3 (three) times daily. 45 tablet 3  . potassium chloride 20 MEQ TBCR Take 20 mEq by mouth daily. 30 tablet 3   No current facility-administered medications for this encounter.    BP 132/80   Pulse 91   Wt 250 lb 4 oz (113.5 kg)   SpO2 99%   BMI 34.90 kg/m  General: NAD Neck: JVP 12 cm, no thyromegaly or thyroid nodule.  Lungs: Clear to auscultation bilaterally with normal respiratory effort. CV: Lateral PMI.  Heart regular S1/S2, no S3/S4, no murmur.  No peripheral edema.  No carotid bruit.  Normal pedal pulses.  Abdomen: Soft, nontender, no hepatosplenomegaly, no distention.  Skin: Intact without lesions or rashes.  Neurologic: Alert and oriented x 3.  Psych: Normal affect. Extremities: No clubbing or cyanosis.  HEENT: Normal.   Assessment/Plan: 1. Chronic systolic CHF: Nonischemic cardiomyopathy.  EF 10-15% by echo in 12/17.  No coronary disease and low output on cath.  HIV negative, SPEP negative.  No family history of early cardiomyopathy (mother with CHF around 41).  No ETOH/drugs. Possible viral myocarditis.  His weight has gone up since getting home and on exam, he is volume overloaded.   - Continue digoxin 0.125 daily, check level.   - Increase Lasix to 40 mg bid and add KCl 20 daily.  BMET today and in 1 week. - Continue  current losartan and spironolactone.  - We will work with him to get Bidil, will start 0.5 tab tid.  - Hold off on Coreg for now with low output and volume overload, will eventually start.  - Set up cardiac MRI at next appt.  2. LV thrombus: Continue warfarin.   Followup in 1 week.   Marca Ancona 04/15/2016

## 2016-04-15 NOTE — Patient Instructions (Signed)
Labs today (will call for abnormal results, otherwise no news is good news)  START taking potassium Chloride 20 mEq (1 Tab) Once Daily  INCREASE lasix to 40 mg (1 Tab) Two times daily  START Bidil (0.5 Tab) Three Times Daily  Follow up in 1 week.

## 2016-04-16 ENCOUNTER — Telehealth (HOSPITAL_COMMUNITY): Payer: Self-pay | Admitting: *Deleted

## 2016-04-16 ENCOUNTER — Telehealth: Payer: Self-pay | Admitting: Cardiology

## 2016-04-16 NOTE — Telephone Encounter (Signed)
Message sent to [facility] to schedule  [facility].

## 2016-04-16 NOTE — Telephone Encounter (Signed)
Attempted to call the patient but his phone is not accepting any calls at this time.

## 2016-04-19 ENCOUNTER — Ambulatory Visit (INDEPENDENT_AMBULATORY_CARE_PROVIDER_SITE_OTHER): Payer: Medicare Other | Admitting: *Deleted

## 2016-04-19 DIAGNOSIS — I513 Intracardiac thrombosis, not elsewhere classified: Secondary | ICD-10-CM | POA: Diagnosis present

## 2016-04-19 LAB — POCT INR: INR: 2.2

## 2016-04-23 ENCOUNTER — Other Ambulatory Visit: Payer: Self-pay

## 2016-04-23 ENCOUNTER — Ambulatory Visit: Payer: Medicare Other

## 2016-04-23 ENCOUNTER — Telehealth: Payer: Self-pay | Admitting: Cardiology

## 2016-04-23 ENCOUNTER — Encounter (HOSPITAL_COMMUNITY): Payer: Medicare Other

## 2016-04-23 NOTE — Telephone Encounter (Signed)
I called the patient to find out what time and day he wanted to have his MRI.  The patient would like a morning appt as soon as possible.

## 2016-04-23 NOTE — Telephone Encounter (Signed)
Needs refill on coumadin.  Will be in Monday for a check. [facility] [address]

## 2016-04-26 ENCOUNTER — Ambulatory Visit (INDEPENDENT_AMBULATORY_CARE_PROVIDER_SITE_OTHER): Payer: Medicare Other | Admitting: *Deleted

## 2016-04-26 DIAGNOSIS — I513 Intracardiac thrombosis, not elsewhere classified: Secondary | ICD-10-CM | POA: Diagnosis present

## 2016-04-26 LAB — POCT INR: INR: 1.7

## 2016-04-26 MED ORDER — WARFARIN SODIUM 5 MG PO TABS: 10.0000 mg | ORAL_TABLET | Freq: Every day | ORAL | 0 refills | Status: DC

## 2016-04-26 NOTE — Telephone Encounter (Signed)
Refilled 1 month supply as I am not the patient's PCP, I only saw him as a hospital follow.  Thanks, [employer]

## 2016-04-27 ENCOUNTER — Other Ambulatory Visit: Payer: Self-pay | Admitting: Internal Medicine

## 2016-04-27 ENCOUNTER — Telehealth: Payer: Self-pay | Admitting: Cardiology

## 2016-04-27 ENCOUNTER — Encounter: Payer: Self-pay | Admitting: Cardiology

## 2016-04-27 NOTE — Telephone Encounter (Signed)
I tried calling both the home number and cell number and could not reach the patient or leave a message so sending a letter in the mail to the patient regarding cardiac MRI.

## 2016-04-30 ENCOUNTER — Ambulatory Visit (INDEPENDENT_AMBULATORY_CARE_PROVIDER_SITE_OTHER): Payer: Medicare Other | Admitting: *Deleted

## 2016-04-30 DIAGNOSIS — I513 Intracardiac thrombosis, not elsewhere classified: Secondary | ICD-10-CM | POA: Diagnosis present

## 2016-04-30 LAB — POCT INR: INR: 2.6

## 2016-05-01 ENCOUNTER — Other Ambulatory Visit: Payer: Self-pay | Admitting: Internal Medicine

## 2016-05-04 ENCOUNTER — Telehealth: Payer: Self-pay | Admitting: *Deleted

## 2016-05-04 ENCOUNTER — Encounter: Payer: Self-pay | Admitting: *Deleted

## 2016-05-04 NOTE — Telephone Encounter (Signed)
-----   Message from Tillman Sers, DO sent at 05/03/2016 12:40 PM EST ----- Regarding: needs appointment  Patient should have appointment to follow up on heart failure and check labs. If I'm not available he can see anyone.Marland Kitchen He also was supposed to follow up with Dr. [name] (heart failure team) but failed to do so.

## 2016-05-04 NOTE — Telephone Encounter (Signed)
Tried calling patient but was unable to leave a message due to no VM.  Will add his labs for when he comes in for his next INR on 05-07-16 and also try sending him a mychart message making him aware. Kolby Schara,CMA

## 2016-05-07 ENCOUNTER — Ambulatory Visit (INDEPENDENT_AMBULATORY_CARE_PROVIDER_SITE_OTHER): Payer: Medicare Other | Admitting: *Deleted

## 2016-05-07 ENCOUNTER — Ambulatory Visit (HOSPITAL_COMMUNITY): Admission: RE | Admit: 2016-05-07 | Payer: Medicare Other | Source: Ambulatory Visit

## 2016-05-07 ENCOUNTER — Other Ambulatory Visit: Payer: Self-pay | Admitting: *Deleted

## 2016-05-07 DIAGNOSIS — I513 Intracardiac thrombosis, not elsewhere classified: Secondary | ICD-10-CM

## 2016-05-07 LAB — POCT INR: INR: 3.7

## 2016-05-07 MED ORDER — WARFARIN SODIUM 5 MG PO TABS: 10.0000 mg | ORAL_TABLET | Freq: Every day | ORAL | 3 refills | Status: DC

## 2016-05-07 NOTE — Telephone Encounter (Signed)
Refilled Coumadin, did not refill other medicines as I had done so on 04/26/16. Spoke with pharmacist to confirm other medications had refills available at pharmacy.

## 2016-05-08 ENCOUNTER — Other Ambulatory Visit: Payer: Self-pay | Admitting: Family Medicine

## 2016-05-10 NOTE — Telephone Encounter (Signed)
Unable to reach patient. [facility] [address] about [facility]- confirmed he has picked up the Losartan, spironolactone, and digoxin on 05/08/16. Unsure why we keep getting refill requests for this. If able to reach patient please let him know he needs an appointment to be seen by one of the physicians at our clinic to follow up on his heart failure.

## 2016-05-11 ENCOUNTER — Ambulatory Visit (INDEPENDENT_AMBULATORY_CARE_PROVIDER_SITE_OTHER): Payer: Medicare Other | Admitting: *Deleted

## 2016-05-11 ENCOUNTER — Ambulatory Visit (INDEPENDENT_AMBULATORY_CARE_PROVIDER_SITE_OTHER): Payer: Medicare Other | Admitting: Family Medicine

## 2016-05-11 ENCOUNTER — Telehealth: Payer: Self-pay | Admitting: *Deleted

## 2016-05-11 ENCOUNTER — Encounter: Payer: Self-pay | Admitting: Family Medicine

## 2016-05-11 VITALS — BP 110/76 | HR 98 | Temp 97.9°F | Ht 71.0 in | Wt 250.6 lb

## 2016-05-11 DIAGNOSIS — I513 Intracardiac thrombosis, not elsewhere classified: Secondary | ICD-10-CM

## 2016-05-11 DIAGNOSIS — I509 Heart failure, unspecified: Secondary | ICD-10-CM

## 2016-05-11 DIAGNOSIS — R69 Illness, unspecified: Secondary | ICD-10-CM

## 2016-05-11 DIAGNOSIS — F99 Mental disorder, not otherwise specified: Secondary | ICD-10-CM | POA: Diagnosis not present

## 2016-05-11 HISTORY — DX: Mental disorder, not otherwise specified: F99

## 2016-05-11 LAB — POCT INR: INR: 2.1

## 2016-05-11 MED ORDER — WARFARIN SODIUM 5 MG PO TABS: 10.0000 mg | ORAL_TABLET | Freq: Every day | ORAL | 3 refills | Status: DC

## 2016-05-11 MED ORDER — FUROSEMIDE 40 MG PO TABS: 40.0000 mg | ORAL_TABLET | Freq: Two times a day (BID) | ORAL | 3 refills | Status: DC

## 2016-05-11 MED ORDER — ISOSORB DINITRATE-HYDRALAZINE 20-37.5 MG PO TABS: 0.5000 | ORAL_TABLET | Freq: Three times a day (TID) | ORAL | 3 refills | Status: DC

## 2016-05-11 MED ORDER — POTASSIUM CHLORIDE ER 20 MEQ PO TBCR: 20.0000 meq | EXTENDED_RELEASE_TABLET | Freq: Every day | ORAL | 3 refills | Status: DC

## 2016-05-11 NOTE — Patient Instructions (Addendum)
Thank you so much for coming to visit today! I have refilled your medications We will check your potassium and kidney function today. Please call Dr. Alford Highland office today to schedule your MRI and determine when you need to be reseen there.  Please return to see your PCP in one month.  Dr. Caroleen Hamman

## 2016-05-11 NOTE — Telephone Encounter (Signed)
[facility] for Bidil 20-37.5 mg. PA completed online at www.covermymeds.com. PA was approved via [employer] until 05/11/2017.   Clovis Pu, RN
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
## 2016-05-19 ENCOUNTER — Telehealth (HOSPITAL_COMMUNITY): Payer: Self-pay | Admitting: Infectious Diseases

## 2016-05-19 ENCOUNTER — Telehealth (HOSPITAL_COMMUNITY): Payer: Self-pay | Admitting: Vascular Surgery

## 2016-05-19 NOTE — Telephone Encounter (Signed)
Call received from Mr. Manchego requesting update on his appointment. Per records he had a no show on 04/23/16. He reports he was confused and wrote the appt down wrong. Requesting a callback from clinic to schedule appt with Dr. [name] and about Bidil cost. Note in the chart back in 2/6 reporting PA was approved. Instructed him to call his pharmacy since this now appears to be approved.   Rexene Alberts, RN VAD Coordinator   Office: [phone] 24/7 Emergency VAD Pager: [phone]

## 2016-05-19 NOTE — Telephone Encounter (Signed)
Left pt message to get him rescheduled to see [facility]

## 2016-05-24 ENCOUNTER — Other Ambulatory Visit (HOSPITAL_COMMUNITY): Payer: Self-pay

## 2016-05-24 MED ORDER — FUROSEMIDE 40 MG PO TABS: 40.0000 mg | ORAL_TABLET | Freq: Two times a day (BID) | ORAL | 3 refills | Status: DC

## 2016-05-24 MED ORDER — POTASSIUM CHLORIDE ER 20 MEQ PO TBCR: 20.0000 meq | EXTENDED_RELEASE_TABLET | Freq: Every day | ORAL | 3 refills | Status: DC

## 2016-05-24 NOTE — Telephone Encounter (Signed)
Patient returned call to get scheduled with DM.  Next available end of march. Patient reports not feeling well generally (fatigue, SOB), and states he did not start taking some of his medications until last Friday. Will add on PA side this week to be seen. Advised to call CHF clinic for worsening s/s. Aware and agreeable.  Ave Filter, RN

## 2016-05-26 ENCOUNTER — Encounter (HOSPITAL_COMMUNITY): Payer: Medicare Other

## 2016-05-28 ENCOUNTER — Telehealth (HOSPITAL_COMMUNITY): Payer: Self-pay | Admitting: *Deleted

## 2016-05-28 NOTE — Telephone Encounter (Signed)
patient no showed for [facility] 05/07/16

## 2016-06-01 ENCOUNTER — Telehealth: Payer: Self-pay | Admitting: Family Medicine

## 2016-06-01 NOTE — Telephone Encounter (Signed)
Attempted to contact Kenneth Robertson on his mobile and home numbers. No answer or way to leave voicemail. Planned to ask him to follow up with our clinic for his INR checks as he missed his last appointment for this. INR had not been therapeutic. From his last visit it seemed clear he would seek care at another clinic but would like to confirm this with him and ensure he has a place to get his INR checked and CHF followed. He has follow up appointment scheduled with me for 3/14. Will see if he shows for this.

## 2016-06-03 ENCOUNTER — Ambulatory Visit: Payer: Medicare Other | Admitting: Student

## 2016-06-03 ENCOUNTER — Ambulatory Visit (INDEPENDENT_AMBULATORY_CARE_PROVIDER_SITE_OTHER): Payer: Medicare Other | Admitting: Family Medicine

## 2016-06-03 VITALS — BP 100/72 | HR 100 | Temp 97.9°F | Wt 253.8 lb

## 2016-06-03 DIAGNOSIS — B9789 Other viral agents as the cause of diseases classified elsewhere: Secondary | ICD-10-CM | POA: Diagnosis not present

## 2016-06-03 DIAGNOSIS — J069 Acute upper respiratory infection, unspecified: Secondary | ICD-10-CM

## 2016-06-03 MED ORDER — IPRATROPIUM BROMIDE 0.06 % NA SOLN: 2.0000 | Freq: Four times a day (QID) | NASAL | 12 refills | Status: DC

## 2016-06-03 MED ORDER — GUAIFENESIN-DM 100-10 MG/5ML PO SYRP: 5.0000 mL | ORAL_SOLUTION | ORAL | 0 refills | Status: DC | PRN

## 2016-06-03 NOTE — Patient Instructions (Signed)
Upper Respiratory Infection, Adult Most upper respiratory infections (URIs) are a viral infection of the air passages leading to the lungs. A URI affects the nose, throat, and upper air passages. The most common type of URI is nasopharyngitis and is typically referred to as "the common cold." URIs run their course and usually go away on their own. Most of the time, a URI does not require medical attention, but sometimes a bacterial infection in the upper airways can follow a viral infection. This is called a secondary infection. Sinus and middle ear infections are common types of secondary upper respiratory infections. Bacterial pneumonia can also complicate a URI. A URI can worsen asthma and chronic obstructive pulmonary disease (COPD). Sometimes, these complications can require emergency medical care and may be life threatening. What are the causes? Almost all URIs are caused by viruses. A virus is a type of germ and can spread from one person to another. What increases the risk? You may be at risk for a URI if:  You smoke.  You have chronic heart or lung disease.  You have a weakened defense (immune) system.  You are very young or very old.  You have nasal allergies or asthma.  You work in crowded or poorly ventilated areas.  You work in health care facilities or schools.  What are the signs or symptoms? Symptoms typically develop 2-3 days after you come in contact with a cold virus. Most viral URIs last 7-10 days. However, viral URIs from the influenza virus (flu virus) can last 14-18 days and are typically more severe. Symptoms may include:  Runny or stuffy (congested) nose.  Sneezing.  Cough.  Sore throat.  Headache.  Fatigue.  Fever.  Loss of appetite.  Pain in your forehead, behind your eyes, and over your cheekbones (sinus pain).  Muscle aches.  How is this diagnosed? Your health care provider may diagnose a URI by:  Physical exam.  Tests to check that your  symptoms are not due to another condition such as: ? Strep throat. ? Sinusitis. ? Pneumonia. ? Asthma.  How is this treated? A URI goes away on its own with time. It cannot be cured with medicines, but medicines may be prescribed or recommended to relieve symptoms. Medicines may help:  Reduce your fever.  Reduce your cough.  Relieve nasal congestion.  Follow these instructions at home:  Take medicines only as directed by your health care provider.  Gargle warm saltwater or take cough drops to comfort your throat as directed by your health care provider.  Use a warm mist humidifier or inhale steam from a shower to increase air moisture. This may make it easier to breathe.  Drink enough fluid to keep your urine clear or pale yellow.  Eat soups and other clear broths and maintain good nutrition.  Rest as needed.  Return to work when your temperature has returned to normal or as your health care provider advises. You may need to stay home longer to avoid infecting others. You can also use a face mask and careful hand washing to prevent spread of the virus.  Increase the usage of your inhaler if you have asthma.  Do not use any tobacco products, including cigarettes, chewing tobacco, or electronic cigarettes. If you need help quitting, ask your health care provider. How is this prevented? The best way to protect yourself from getting a cold is to practice good hygiene.  Avoid oral or hand contact with people with cold symptoms.  Wash your   hands often if contact occurs.  There is no clear evidence that vitamin C, vitamin E, echinacea, or exercise reduces the chance of developing a cold. However, it is always recommended to get plenty of rest, exercise, and practice good nutrition. Contact a health care provider if:  You are getting worse rather than better.  Your symptoms are not controlled by medicine.  You have chills.  You have worsening shortness of breath.  You have  brown or red mucus.  You have yellow or brown nasal discharge.  You have pain in your face, especially when you bend forward.  You have a fever.  You have swollen neck glands.  You have pain while swallowing.  You have white areas in the back of your throat. Get help right away if:  You have severe or persistent: ? Headache. ? Ear pain. ? Sinus pain. ? Chest pain.  You have chronic lung disease and any of the following: ? Wheezing. ? Prolonged cough. ? Coughing up blood. ? A change in your usual mucus.  You have a stiff neck.  You have changes in your: ? Vision. ? Hearing. ? Thinking. ? Mood. This information is not intended to replace advice given to you by your health care provider. Make sure you discuss any questions you have with your health care provider. Document Released: 09/15/2000 Document Revised: 11/23/2015 Document Reviewed: 06/27/2013 Elsevier Interactive Patient Education  2017 Elsevier Inc.  

## 2016-06-03 NOTE — Progress Notes (Signed)
    Subjective:  Kenneth Robertson is a 36 y.o. male who presents to the [facility] today with a chief complaint of sore throat.   HPI:  Sore Throat Symptoms started about 4 days ago. Associated symptoms include rhinorrhea and cough. He tried taking tylenol which helped some. Had some sick contacts this past weekend. Cough is worse at night and is keeping him up at night. He has not tried any other treatments. No fevers. No shortness of breath.   ROS: Per HPI  Objective:  Physical Exam: BP 100/72   Pulse 100   Temp 97.9 F (36.6 C) (Oral)   Wt 253 lb 12.8 oz (115.1 kg)   SpO2 98%   BMI 35.40 kg/m   Gen: NAD, resting comfortably HEENT: TMs clear bilaterally. MMM. OP clear. No LAD.  CV: RRR with no murmurs appreciated Pulm: NWOB, CTAB with no crackles, wheezes, or rhonchi MSK: no edema, cyanosis, or clubbing noted Skin: warm, dry Neuro: grossly normal, moves all extremities Psych: Normal affect and thought content  Assessment/Plan:  Viral URI with Cough No signs of bacterial infection. Centor score -1. Will treat symptomatically with atrovent and robitussin. Return precautions reviewed. Follow up precautions reviewed.   Katina Degree. Jimmey Ralph, MD [facility] Resident PGY-3 06/03/2016 3:02 PM

## 2016-06-08 ENCOUNTER — Ambulatory Visit (HOSPITAL_COMMUNITY)
Admission: RE | Admit: 2016-06-08 | Discharge: 2016-06-08 | Disposition: A | Discharge status: Home / Self Care | Payer: Medicare Other | Source: Ambulatory Visit | Attending: Cardiology | Admitting: Cardiology

## 2016-06-08 ENCOUNTER — Encounter (HOSPITAL_COMMUNITY): Payer: Self-pay

## 2016-06-08 ENCOUNTER — Other Ambulatory Visit: Payer: Self-pay | Admitting: Family Medicine

## 2016-06-08 ENCOUNTER — Ambulatory Visit (INDEPENDENT_AMBULATORY_CARE_PROVIDER_SITE_OTHER): Payer: Medicare Other | Admitting: *Deleted

## 2016-06-08 VITALS — BP 128/80 | HR 103 | Wt 254.2 lb

## 2016-06-08 DIAGNOSIS — I513 Intracardiac thrombosis, not elsewhere classified: Secondary | ICD-10-CM | POA: Diagnosis not present

## 2016-06-08 DIAGNOSIS — I110 Hypertensive heart disease with heart failure: Secondary | ICD-10-CM | POA: Diagnosis not present

## 2016-06-08 DIAGNOSIS — I5022 Chronic systolic (congestive) heart failure: Secondary | ICD-10-CM | POA: Diagnosis not present

## 2016-06-08 DIAGNOSIS — Z7901 Long term (current) use of anticoagulants: Secondary | ICD-10-CM | POA: Diagnosis not present

## 2016-06-08 DIAGNOSIS — I428 Other cardiomyopathies: Secondary | ICD-10-CM

## 2016-06-08 DIAGNOSIS — F329 Major depressive disorder, single episode, unspecified: Secondary | ICD-10-CM | POA: Insufficient documentation

## 2016-06-08 HISTORY — DX: Chronic systolic (congestive) heart failure: I50.22

## 2016-06-08 LAB — BASIC METABOLIC PANEL
ANION GAP: 6 (ref 5–15)
BUN: 18 mg/dL (ref 6–20)
CALCIUM: 8.9 mg/dL (ref 8.9–10.3)
CO2: 23 mmol/L (ref 22–32)
Chloride: 109 mmol/L (ref 101–111)
Creatinine, Ser: 1.35 mg/dL — ABNORMAL HIGH (ref 0.61–1.24)
Glucose, Bld: 119 mg/dL — ABNORMAL HIGH (ref 65–99)
Potassium: 4.4 mmol/L (ref 3.5–5.1)
SODIUM: 138 mmol/L (ref 135–145)

## 2016-06-08 LAB — POCT INR: INR: 2.9

## 2016-06-08 LAB — BRAIN NATRIURETIC PEPTIDE: B NATRIURETIC PEPTIDE 5: 310.8 pg/mL — AB (ref 0.0–100.0)

## 2016-06-08 NOTE — Patient Instructions (Addendum)
Labs today  We will schedule you for a cardiac MRI, we will let you know at your next appointment when it is scheduled for  Please let our office know when you get a phone number  Please go to [facility] when you leave here for a coumadin check  Your physician recommends that you schedule a follow-up appointment in: 4 weeks

## 2016-06-08 NOTE — Progress Notes (Signed)
Advanced Heart Failure Clinic Note   PCP: Dr. Leonides Schanz Cardiology: Dr. [name]  36 yo with history of HTN and newly-diagnosed nonischemic cardiomyopathy presents for CHF clinic followup.  He was admitted in 12/17 after 1 month of worsening dyspnea.  He was volume overloaded on exam with EF 10-15% on echo.  He was diuresed and RHC/LHC done.  This showed no CAD. Cardiac output was low.    Since getting home, he has gained weight.  He was unable to get Bidil due to cost.  He is taking his other meds.  He is short of breath after walking about 75 yards.  Not very active since getting home.  No orthopnea/PND. No chest pain.  No lightheadedness/syncope.  No palpitations.    He presents today for follow up.  Missed 1 week follow up in January and has not been seen since then.  Feels like his abdomen is distended.  Weight trending up at home.  Doesn't exercise.  Watches salt but drinks a lot of fluid. No SOB on flat ground.  Walked almost half a mile yesterday with no problem.  Does get mild SOB up hill or up steps.  Denies orthopnea. Is on Bidil.  Ran out of coumadin. Taking all other medications.  Hasn't had any medications this morning as he hasn't eaten yet.   Labs (1/18): K 4.3, creatinine 1.47, BNP 243, TSH normal, HIV negative, SPEP negative  PMH: 1. HTN 2. Depression 3. Chronic systolic CHF: Nonischemic cardiomyopathy, diagnosed in 12/17.  - Echo (12/17) with EF 10-15%, LV thrombus, moderate MR, PASP 47 mmHg, moderately dilated and dysfunctional RV.  - LHC/RHC (12/17): No CAD; mean RA 8, PA 41/22, mean PCWP 20, CI 1.7.  - HIV, SPEP, TSH negative.  4. LV thrombus: On warfarin.   SH: On disability, nonsmoker, no ETOH/drugs.  FH: Mother with CHF but developed later in life (around 78).  No sudden cardiac death.   ROS: All systems reviewed and negative except as pre HPI.   Current Outpatient Prescriptions  Medication Sig Dispense Refill  . digoxin (LANOXIN) 0.125 MG tablet TAKE 1 TABLET  (0.125 MG TOTAL) BY MOUTH DAILY. 30 tablet 0  . furosemide (LASIX) 40 MG tablet Take 1 tablet (40 mg total) by mouth 2 (two) times daily. 60 tablet 3  . ipratropium (ATROVENT) 0.06 % nasal spray Place 2 sprays into both nostrils 4 (four) times daily. 15 mL 12  . isosorbide-hydrALAZINE (BIDIL) 20-37.5 MG tablet Take 0.5 tablets by mouth 3 (three) times daily. 45 tablet 3  . losartan (COZAAR) 25 MG tablet TAKE 1 TABLET (25 MG TOTAL) BY MOUTH DAILY. 30 tablet 0  . Potassium Chloride ER 20 MEQ TBCR Take 20 mEq by mouth daily. 30 tablet 3  . spironolactone (ALDACTONE) 25 MG tablet TAKE 1 TABLET (25 MG TOTAL) BY MOUTH DAILY. 30 tablet 0  . guaiFENesin-dextromethorphan (ROBITUSSIN DM) 100-10 MG/5ML syrup Take 5 mLs by mouth every 4 (four) hours as needed for cough. (Patient not taking: Reported on 06/08/2016) 118 mL 0  . warfarin (COUMADIN) 5 MG tablet Take 2 tablets (10 mg total) by mouth daily. Take 1 tablet (5mg ) Tuesday (Patient not taking: Reported on 06/08/2016) 30 tablet 3   No current facility-administered medications for this encounter.    BP 128/80   Pulse (!) 103   Wt 254 lb 4 oz (115.3 kg)   SpO2 100%   BMI 35.46 kg/m    Wt Readings from Last 3 Encounters:  06/08/16 254 lb 4  oz (115.3 kg)  06/03/16 253 lb 12.8 oz (115.1 kg)  05/11/16 250 lb 9.6 oz (113.7 kg)    General: NAD Neck: JVP 7-8 cm, No thyromegaly or thyroid nodule.  Lungs: CTAB, normal effort.  CV: Lateral PMI.  Heart regular S1/S2, no S3/S4, no murmur. Slightly tachy. No peripheral edema.  No carotid bruit.  Normal pedal pulses.  Abdomen: Soft, NT, ND, no HSM. No bruits or masses. +BS  Skin: Intact without lesions or rashes.  Neurologic: Alert and oriented x 3.  Psych: Normal affect. Extremities: No clubbing or cyanosis.  HEENT: Normal.   Assessment/Plan: 1. Chronic systolic CHF: Nonischemic cardiomyopathy.  EF 10-15% by echo in 12/17.  No coronary disease and low output on cath.  HIV negative, SPEP negative.  No  family history of early cardiomyopathy (mother with CHF around 72).  No ETOH/drugs. Possible viral myocarditis.  His weight has gone up since getting home and on exam, he is volume overloaded.   - Continue digoxin 0.125 daily, check level.   - Continue lasix 40 mg BID and KCl 20 meq daily. BMET/BNP today.  - Continue losartan 25 mg daily - Continue spironolactone 25 mg daily.  - Continue Bidil 0.5 mg tab TID.  - Holding off on Coreg for now with recent low output. Will await cMRI results.  No-showed first time.  Strongly encouraged to attent.  2. LV thrombus - Continue coumadin. Denies bleeding - Needs follow up ASAP. Have scheduled for INR follow up this am at 1030.  Stable on current meds. Hasn't taken any this morning.  Will schedule for cMRI.  Needs coumadin follow up. Will try to set up follow up with Family medicine today, as patient doesn't currently have a working cell phone.   Graciella Freer, PA-C  06/08/2016

## 2016-06-08 NOTE — Progress Notes (Signed)
  Called pharmacy to refill Coumadin 5 mg tabs, take 2 tabs daily except for Tuesday take 1 tab. Disp 60. Refill 3.

## 2016-06-09 ENCOUNTER — Other Ambulatory Visit (HOSPITAL_COMMUNITY): Payer: Self-pay | Admitting: *Deleted

## 2016-06-09 MED ORDER — DIGOXIN 125 MCG PO TABS: 0.1250 mg | ORAL_TABLET | Freq: Every day | ORAL | 3 refills | Status: DC

## 2016-06-09 MED ORDER — SPIRONOLACTONE 25 MG PO TABS: 25.0000 mg | ORAL_TABLET | Freq: Every day | ORAL | 3 refills | Status: DC

## 2016-06-09 MED ORDER — POTASSIUM CHLORIDE ER 20 MEQ PO TBCR: 20.0000 meq | EXTENDED_RELEASE_TABLET | Freq: Every day | ORAL | 3 refills | Status: DC

## 2016-06-09 MED ORDER — LOSARTAN POTASSIUM 25 MG PO TABS: 25.0000 mg | ORAL_TABLET | Freq: Every day | ORAL | 3 refills | Status: DC

## 2016-06-11 ENCOUNTER — Telehealth: Payer: Self-pay | Admitting: Pharmacist

## 2016-06-11 ENCOUNTER — Encounter: Payer: Self-pay | Admitting: Cardiology

## 2016-06-11 ENCOUNTER — Telehealth: Payer: Self-pay

## 2016-06-11 MED ORDER — WARFARIN SODIUM 5 MG PO TABS: 10.0000 mg | ORAL_TABLET | Freq: Every day | ORAL | 0 refills | Status: DC

## 2016-06-11 NOTE — Telephone Encounter (Signed)
Spoke to [place] who sounded very excited about joining the [employer].  He is scheduled to come in on Monday 06/14/16 at 10am to register.

## 2016-06-11 NOTE — Telephone Encounter (Signed)
No answer - left message.   RE request by CHF clinic nurse who made me aware patient was in need of refill for warfarin.   Left message re need for medication refill acknowledged and sent in new short term supply of medication (warfarin) until next visit

## 2016-06-16 ENCOUNTER — Ambulatory Visit: Payer: Medicare Other | Admitting: Family Medicine

## 2016-06-21 ENCOUNTER — Telehealth: Payer: Self-pay | Admitting: *Deleted

## 2016-06-21 NOTE — Telephone Encounter (Signed)
-----   Message from Tillman Sers, DO sent at 06/18/2016 11:09 AM EDT ----- Regarding: RE: missed appointment, needs reschedule  He has been coming only occasionally for INR checks, were you able to speak with him directly?   ----- Message ----- From: Henri Medal, CMA Sent: 06/18/2016  10:15 AM To: Tillman Sers, DO Subject: RE: missed appointment, needs reschedule       Patient is coming in on 06-25-16 for his INR check in the lab and I asked robert to make him a follow up appt with you.  ----- Message ----- From: Tillman Sers, DO Sent: 06/16/2016   3:53 PM To: [facility] Subject: missed appointment, needs reschedule            Can we try to contact Kenneth Robertson to reschedule? If he does not come in to be seen with [place] in the next month we would have to stop managing his coumadin. Noted he has been to see HF clinic. He is difficult to reach.  Thanks!

## 2016-06-21 NOTE — Telephone Encounter (Signed)
Tried calling patient to schedule an appointment with PCP to discuss his medication and compliance.  Unable to leave message but saw that Dr. Raymondo Band was also trying to get a hold of patient last week.  Yuvan Medinger,CMA

## 2016-06-23 ENCOUNTER — Telehealth: Payer: Self-pay | Admitting: Licensed Clinical Social Worker

## 2016-06-23 NOTE — Telephone Encounter (Signed)
Patient called requesting information on housing options as he "can't stay he due to all the people smoking outside my apartment". Patient states he has gone to his landlord and the city for assistance with no success. Patient states "my apartment is about to be condemned". CSW discussed returning to [employer] to check on status of his application, follow up with [facility] and Homeless Prevention program. Patient grateful for the assistance and plans to follow up with recommendations. CSW available as needed. Lasandra Beech, LCSW, CCSW-MCS [phone]

## 2016-06-25 ENCOUNTER — Ambulatory Visit (INDEPENDENT_AMBULATORY_CARE_PROVIDER_SITE_OTHER): Payer: Medicare Other | Admitting: *Deleted

## 2016-06-25 DIAGNOSIS — I513 Intracardiac thrombosis, not elsewhere classified: Secondary | ICD-10-CM

## 2016-06-25 LAB — POCT INR: INR: 4.8

## 2016-07-02 ENCOUNTER — Ambulatory Visit: Payer: Medicare Other

## 2016-07-06 ENCOUNTER — Encounter (HOSPITAL_COMMUNITY): Payer: Medicare Other

## 2016-07-14 ENCOUNTER — Inpatient Hospital Stay (HOSPITAL_COMMUNITY): Admission: RE | Admit: 2016-07-14 | Payer: Medicare Other | Source: Ambulatory Visit

## 2016-07-15 ENCOUNTER — Ambulatory Visit (HOSPITAL_COMMUNITY): Admission: RE | Admit: 2016-07-15 | Payer: Medicare Other | Source: Ambulatory Visit

## 2016-07-16 ENCOUNTER — Telehealth (HOSPITAL_COMMUNITY): Payer: Self-pay | Admitting: *Deleted

## 2016-07-16 NOTE — Progress Notes (Signed)
[facility] PREP Progress Report   Patient Details  Name: Kenneth Robertson MRN: [id number] Date of Birth: September 21, 1980 Age: 36 y.o. PCP: Tillman Sers, DO  Vitals:   07/16/16 1349  BP: 110/86  Pulse: 98  Resp: 20  SpO2: 98%  Weight: 253 lb 6.4 oz (114.9 kg)         [name] Eval - 07/16/16 1300      Referral    Referring Provider Dr. [name]   Reason for referral Heart Failure;Family History;Hypertension;Obesitity/Overweight;Inactivity   Program Start Date 07/16/16     Measurement   Neck measurement 17.5 Inches   Waist Circumference 45 inches   Body fat 30.9 percent     Information for Trainer   Goals "learn healthy living, to avoid hospital admissions, weight loss, & to get my heart better"   Current Exercise "walks a little"     Timed Up and Go (TUGS)   Timed Up and Go Low risk <9 seconds     Mobility and Daily Activities   I find it easy to walk up or down two or more flights of stairs. 2   I have no trouble taking out the trash. 4   I do housework such as vacuuming and dusting on my own without difficulty. 2   I can easily lift a gallon of milk (8lbs). 4   I can easily walk a mile. 1   I have no trouble reaching into high cupboards or reaching down to pick up something from the floor. 2   I do not have trouble doing out-door work such as Loss adjuster, chartered, raking leaves, or gardening. 2     Mobility and Daily Activities   I feel younger than my age. 2   I feel independent. 4   I feel energetic. 2   I live an active life.  2   I feel strong. 2   I feel healthy. 3   I feel active as other people my age. 2     How fit and strong are you.   Fit and Strong Total Score 34     Past Medical History:  Diagnosis Date  . Depression   . Hypertension   . Visual impairment    Past Surgical History:  Procedure Laterality Date  . CARDIAC CATHETERIZATION N/A 04/02/2016   Procedure: Right/Left Heart Cath and Coronary Angiography;  Surgeon: Laurey Morale, MD;   Location: [facility] INVASIVE CV LAB;  Service: Cardiovascular;  Laterality: N/A;  . EYE SURGERY     Left   . TOOTH EXTRACTION     History  Smoking Status  . Never Smoker  Smokeless Tobacco  . Never Used     Emett's preferred day for training is Monday and Wednesday and preferred time is Morning (9AM -12PM)   Rose Fillers 07/16/2016, 1:55 PM

## 2016-07-16 NOTE — Telephone Encounter (Signed)
Patient cancelled his CMRI appt.

## 2016-07-21 ENCOUNTER — Ambulatory Visit (HOSPITAL_COMMUNITY)
Admission: RE | Admit: 2016-07-21 | Discharge: 2016-07-21 | Disposition: A | Discharge status: Home / Self Care | Payer: Medicare Other | Source: Ambulatory Visit | Attending: Cardiology | Admitting: Cardiology

## 2016-07-21 ENCOUNTER — Encounter (HOSPITAL_COMMUNITY): Payer: Self-pay

## 2016-07-21 VITALS — BP 102/86 | HR 102 | Ht 70.0 in | Wt 253.2 lb

## 2016-07-21 DIAGNOSIS — I429 Cardiomyopathy, unspecified: Secondary | ICD-10-CM | POA: Insufficient documentation

## 2016-07-21 DIAGNOSIS — Z7901 Long term (current) use of anticoagulants: Secondary | ICD-10-CM | POA: Diagnosis not present

## 2016-07-21 DIAGNOSIS — F329 Major depressive disorder, single episode, unspecified: Secondary | ICD-10-CM | POA: Insufficient documentation

## 2016-07-21 DIAGNOSIS — I513 Intracardiac thrombosis, not elsewhere classified: Secondary | ICD-10-CM | POA: Diagnosis not present

## 2016-07-21 DIAGNOSIS — I5022 Chronic systolic (congestive) heart failure: Secondary | ICD-10-CM | POA: Diagnosis not present

## 2016-07-21 DIAGNOSIS — I110 Hypertensive heart disease with heart failure: Secondary | ICD-10-CM | POA: Diagnosis not present

## 2016-07-21 LAB — BASIC METABOLIC PANEL
Anion gap: 9 (ref 5–15)
BUN: 15 mg/dL (ref 6–20)
CO2: 22 mmol/L (ref 22–32)
Calcium: 9 mg/dL (ref 8.9–10.3)
Chloride: 107 mmol/L (ref 101–111)
Creatinine, Ser: 1.33 mg/dL — ABNORMAL HIGH (ref 0.61–1.24)
Glucose, Bld: 110 mg/dL — ABNORMAL HIGH (ref 65–99)
POTASSIUM: 4.3 mmol/L (ref 3.5–5.1)
SODIUM: 138 mmol/L (ref 135–145)

## 2016-07-21 LAB — CBC
HCT: 41.4 % (ref 39.0–52.0)
Hemoglobin: 14.4 g/dL (ref 13.0–17.0)
MCH: 28.6 pg (ref 26.0–34.0)
MCHC: 34.8 g/dL (ref 30.0–36.0)
MCV: 82.1 fL (ref 78.0–100.0)
PLATELETS: 253 10*3/uL (ref 150–400)
RBC: 5.04 MIL/uL (ref 4.22–5.81)
RDW: 14.3 % (ref 11.5–15.5)
WBC: 3.3 10*3/uL — ABNORMAL LOW (ref 4.0–10.5)

## 2016-07-21 LAB — BRAIN NATRIURETIC PEPTIDE: B NATRIURETIC PEPTIDE 5: 389.8 pg/mL — AB (ref 0.0–100.0)

## 2016-07-21 MED ORDER — LOSARTAN POTASSIUM 25 MG PO TABS: 25.0000 mg | ORAL_TABLET | Freq: Every day | ORAL | 3 refills | Status: DC

## 2016-07-21 MED ORDER — ISOSORB DINITRATE-HYDRALAZINE 20-37.5 MG PO TABS: 1.0000 | ORAL_TABLET | Freq: Three times a day (TID) | ORAL | 3 refills | Status: DC

## 2016-07-21 NOTE — Patient Instructions (Signed)
INCREASE Bidil to 1 tab, three times per day CHANGE Losartan 25  Mg, one tab daily at bedtime  Labs today We will only contact you if something comes back abnormal or we need to make some changes. Otherwise no news is good news!  Your physician recommends that you schedule a follow-up appointment in: 6-8 weeks with Dr [name]  Do the following things EVERYDAY: 1) Weigh yourself in the morning before breakfast. Write it down and keep it in a log. 2) Take your medicines as prescribed 3) Eat low salt foods-Limit salt (sodium) to 2000 mg per day.  4) Stay as active as you can everyday 5) Limit all fluids for the day to less than 2 liters

## 2016-07-21 NOTE — Progress Notes (Signed)
Advanced Heart Failure Clinic Note   PCP: Dr. Leonides Schanz Cardiology: Dr. [name]  36 yo with history of HTN and newly-diagnosed nonischemic cardiomyopathy presents for CHF clinic followup.  He was admitted in 12/17 after 1 month of worsening dyspnea.  He was volume overloaded on exam with EF 10-15% on echo.  He was diuresed and RHC/LHC done.  This showed no CAD. Cardiac output was low.    Since getting home, he has gained weight.  He was unable to get Bidil due to cost.  He is taking his other meds.  He is short of breath after walking about 75 yards.  Not very active since getting home.  No orthopnea/PND. No chest pain.  No lightheadedness/syncope.  No palpitations.    He presents today for follow up. Feeling OK overall, but having problems with his landlord that he doesn't want to talk more about.  [facility] [employer] exercising and learning about nutrition.  Breathing has been great. No SOB on flat ground. Had mild SOB one day after [facility].  Back on his coumadin. No orthopnea, bendopnea, or PND. No CP or palpitations. Taking all medications as directed (did not take this morning yet in hurry to get to appointment). Trying to watch salt and fluid. Denies lightheadedness or dizziness. Going to PCP now as well.   Labs (1/18): K 4.3, creatinine 1.47, BNP 243, TSH normal, HIV negative, SPEP negative  PMH: 1. HTN 2. Depression 3. Chronic systolic CHF: Nonischemic cardiomyopathy, diagnosed in 12/17.  - Echo (12/17) with EF 10-15%, LV thrombus, moderate MR, PASP 47 mmHg, moderately dilated and dysfunctional RV.  - LHC/RHC (12/17): No CAD; mean RA 8, PA 41/22, mean PCWP 20, CI 1.7.  - HIV, SPEP, TSH negative.  4. LV thrombus: On warfarin.   SH: On disability, nonsmoker, no ETOH/drugs.  FH: Mother with CHF but developed later in life (around 64).  No sudden cardiac death.   Review of systems complete and found to be negative unless listed in HPI.    Current Outpatient Prescriptions  Medication Sig  Dispense Refill  . digoxin (LANOXIN) 0.125 MG tablet Take 1 tablet (0.125 mg total) by mouth daily. 30 tablet 3  . furosemide (LASIX) 40 MG tablet Take 1 tablet (40 mg total) by mouth 2 (two) times daily. 60 tablet 3  . isosorbide-hydrALAZINE (BIDIL) 20-37.5 MG tablet Take 0.5 tablets by mouth 3 (three) times daily. 45 tablet 3  . losartan (COZAAR) 25 MG tablet Take 1 tablet (25 mg total) by mouth daily. 30 tablet 3  . Potassium Chloride ER 20 MEQ TBCR Take 20 mEq by mouth daily. 30 tablet 3  . spironolactone (ALDACTONE) 25 MG tablet Take 1 tablet (25 mg total) by mouth daily. 30 tablet 3  . warfarin (COUMADIN) 5 MG tablet Take 2 tablets (10 mg total) by mouth daily. Take 1 tablet (5mg ) Tuesday 30 tablet 0  . guaiFENesin-dextromethorphan (ROBITUSSIN DM) 100-10 MG/5ML syrup Take 5 mLs by mouth every 4 (four) hours as needed for cough. (Patient not taking: Reported on 06/08/2016) 118 mL 0  . ipratropium (ATROVENT) 0.06 % nasal spray Place 2 sprays into both nostrils 4 (four) times daily. (Patient not taking: Reported on 07/21/2016) 15 mL 12   No current facility-administered medications for this encounter.    BP 102/86 (BP Location: Left Arm, Patient Position: Sitting, Cuff Size: Normal)   Pulse (!) 102   Ht 5\' 10"  (1.778 m)   Wt 253 lb 3.2 oz (114.9 kg)   SpO2 99%  BMI 36.33 kg/m    Wt Readings from Last 3 Encounters:  07/21/16 253 lb 3.2 oz (114.9 kg)  07/16/16 253 lb 6.4 oz (114.9 kg)  06/08/16 254 lb 4 oz (115.3 kg)    Physical Exam General: Tall AA male in NAD.  HEENT: Normal.  Neck: Supple. JVD 5-6. Carotids 2+ bilat; no bruits. No thyromegaly or nodule noted. Cor: PMI nondisplaced. Regular, slightly tachy, No M/G/R noted Lungs: CTAB, normal effort. Abdomen: Soft, non-tender, distended, no HSM. No bruits or masses. +BS  Extremities: No cyanosis, clubbing, rash, R and LLE no edema.  Neuro: Alert & orientedx3, cranial nerves grossly intact. moves all 4 extremities w/o difficulty.  Affect pleasant   Assessment/Plan: 1. Chronic systolic CHF: Nonischemic cardiomyopathy.  EF 10-15% by echo in 12/17.  No coronary disease and low output on cath.  HIV negative, SPEP negative.  No family history of early cardiomyopathy (mother with CHF around 72).  No ETOH/drugs. Possible viral myocarditis.   - NYHA Class II symptoms overall and volume status stable on exam.    - Continue digoxin 0.125 daily. Level stable in January.    - Continue lasix 40 mg BID and KCl 20 meq daily. BMET today.   - Continue losartan 25 mg daily - Continue spironolactone 25 mg daily.  - Increase Bidil 1 mg tab TID.   - Holding off on Coreg with recent low output. Suspect he would tolerate BB. Will await cMRI results.  2. LV thrombus - Continue coumadin. No bleeding. Following with coumadin clinic.   Plan for cardiac MRI  this week. Bidil as above. Consider BB at next visit.   Graciella Freer, PA-C  07/21/2016  Greater than 50% of the 25 minute visit was spent in counseling/coordination of care regarding disease state education, medication compliance, importance of follow up, and reviewing his previous Echo and need for cMRI with patient.

## 2016-07-21 NOTE — Progress Notes (Signed)
[facility] [employer] PREP Weekly Session   Patient Details  Name: Kenneth Robertson MRN: [id number] Date of Birth: 06/06/1980 Age: 36 y.o. PCP: Tillman Sers, DO  There were no vitals filed for this visit.      [name] Weekly seesion - 07/21/16 1300      Weekly Session   Topic Discussed Calorie breakdown   Minutes exercised this week 300 minutes  cardio   Classes attended to date 1    Fun things you did since last meeting:"played wii with my niece" Nutrition celebrations for the week"started eating carrots. Ate fruit to replace sweets"   Rose Fillers 07/21/2016, 1:58 PM

## 2016-07-23 ENCOUNTER — Ambulatory Visit (HOSPITAL_COMMUNITY)
Admission: RE | Admit: 2016-07-23 | Discharge: 2016-07-23 | Disposition: A | Discharge status: Home / Self Care | Payer: Medicare Other | Source: Ambulatory Visit | Attending: Cardiology | Admitting: Cardiology

## 2016-07-23 DIAGNOSIS — I429 Cardiomyopathy, unspecified: Secondary | ICD-10-CM | POA: Diagnosis not present

## 2016-07-23 DIAGNOSIS — I509 Heart failure, unspecified: Secondary | ICD-10-CM | POA: Insufficient documentation

## 2016-07-23 MED ORDER — GADOBENATE DIMEGLUMINE 529 MG/ML IV SOLN
30.0000 mL | Freq: Once | INTRAVENOUS | Status: AC | PRN
  Administered 2016-07-23: 30 mL via INTRAVENOUS

## 2016-07-27 ENCOUNTER — Encounter: Payer: Self-pay | Admitting: Family Medicine

## 2016-07-28 NOTE — Progress Notes (Signed)
[facility] [employer] PRE[employer] Weekly Session   Patient Details  Name: Kenneth Robertson MRN: [id number] Date of Birth: 05-Jun-1980 Age: 36 y.o. PCP: Tillman Sers, DO  Vitals:   07/28/16 1402  Weight: 252 lb (114.3 kg)        [name] Weekly seesion - 07/28/16 1400      Weekly Session   Topic Discussed Hitting roadblocks   Minutes exercised this week 420 minutes  all cardio   Classes attended to date 2     Fun things you did since last meeting:"[facility] [employer] [facility] friends" Things you are grateful for:"That my MRI was as predicted" Nutrition celebrations:"Invested in some nutrition drinks to incorporate with my diet.  Ate more fruit"  Rose Fillers 07/28/2016, 2:03 PM

## 2016-08-04 ENCOUNTER — Other Ambulatory Visit: Payer: Self-pay | Admitting: Family Medicine

## 2016-08-05 NOTE — Telephone Encounter (Signed)
Tried calling patient on both numbers listed with no answer or VM. Kenneth Robertson,CMA

## 2016-08-09 ENCOUNTER — Ambulatory Visit (INDEPENDENT_AMBULATORY_CARE_PROVIDER_SITE_OTHER): Payer: Medicare Other | Admitting: *Deleted

## 2016-08-09 ENCOUNTER — Other Ambulatory Visit: Payer: Self-pay | Admitting: *Deleted

## 2016-08-09 DIAGNOSIS — I513 Intracardiac thrombosis, not elsewhere classified: Secondary | ICD-10-CM

## 2016-08-09 LAB — POCT INR: INR: 1

## 2016-08-09 NOTE — Progress Notes (Signed)
Patient had stopped coming in for his INR checks. He has been without his coumadin for 1 week now. I told him he must come in at the scheduled times given to him to have this monitored in order to get his meds refilled. Patient was also told that he is supposed to make a follow up appointment with his PCP or someone in the clinic for his heart failure. Patient stated understanding. Appointment schedule for next Monday for coumadin check. Busick, Rodena Medin

## 2016-08-10 ENCOUNTER — Other Ambulatory Visit: Payer: Self-pay | Admitting: Family Medicine

## 2016-08-10 MED ORDER — WARFARIN SODIUM 5 MG PO TABS: 10.0000 mg | ORAL_TABLET | Freq: Every day | ORAL | 0 refills | Status: DC

## 2016-08-16 ENCOUNTER — Ambulatory Visit: Payer: Medicare Other

## 2016-08-18 NOTE — Progress Notes (Signed)
[facility] [employer] PREP Weekly Session   Patient Details  Name: Kenneth Robertson MRN: [id number] Date of Birth: 1980-08-10 Age: 36 y.o. PCP: Tillman Sers, DO  There were no vitals filed for this visit.      [name] Weekly seesion - 08/18/16 1200      Weekly Session   Topic Discussed Other ways to be active   Minutes exercised this week 600 minutes  all cardio   Classes attended to date 3     Fun things you did since last meeting:"walk downtown" Nutrition celebrations for the week:"baked a potato" Barriers:"stress prohibited me from doing really what I wanted to do"  Rose Fillers 08/18/2016, 12:54 PM

## 2016-09-06 ENCOUNTER — Ambulatory Visit (HOSPITAL_COMMUNITY)
Admission: RE | Admit: 2016-09-06 | Discharge: 2016-09-06 | Disposition: A | Discharge status: Home / Self Care | Payer: Medicare Other | Source: Ambulatory Visit | Attending: Cardiology | Admitting: Cardiology

## 2016-09-06 ENCOUNTER — Encounter (HOSPITAL_COMMUNITY): Payer: Self-pay

## 2016-09-06 VITALS — BP 101/65 | HR 87 | Wt 245.5 lb

## 2016-09-06 DIAGNOSIS — I429 Cardiomyopathy, unspecified: Secondary | ICD-10-CM | POA: Insufficient documentation

## 2016-09-06 DIAGNOSIS — I513 Intracardiac thrombosis, not elsewhere classified: Secondary | ICD-10-CM

## 2016-09-06 DIAGNOSIS — Z86718 Personal history of other venous thrombosis and embolism: Secondary | ICD-10-CM | POA: Insufficient documentation

## 2016-09-06 DIAGNOSIS — I110 Hypertensive heart disease with heart failure: Secondary | ICD-10-CM | POA: Diagnosis not present

## 2016-09-06 DIAGNOSIS — I5022 Chronic systolic (congestive) heart failure: Secondary | ICD-10-CM

## 2016-09-06 DIAGNOSIS — I240 Acute coronary thrombosis not resulting in myocardial infarction: Secondary | ICD-10-CM

## 2016-09-06 DIAGNOSIS — Z7901 Long term (current) use of anticoagulants: Secondary | ICD-10-CM | POA: Insufficient documentation

## 2016-09-06 DIAGNOSIS — F329 Major depressive disorder, single episode, unspecified: Secondary | ICD-10-CM | POA: Diagnosis not present

## 2016-09-06 LAB — BASIC METABOLIC PANEL
Anion gap: 8 (ref 5–15)
BUN: 11 mg/dL (ref 6–20)
CALCIUM: 9.2 mg/dL (ref 8.9–10.3)
CHLORIDE: 105 mmol/L (ref 101–111)
CO2: 25 mmol/L (ref 22–32)
CREATININE: 1.39 mg/dL — AB (ref 0.61–1.24)
GFR calc Af Amer: >60 mL/min (ref >60)
GFR calc non Af Amer: >60 mL/min (ref >60)
Glucose, Bld: 111 mg/dL — ABNORMAL HIGH (ref 65–99)
Potassium: 3.9 mmol/L (ref 3.5–5.1)
SODIUM: 138 mmol/L (ref 135–145)

## 2016-09-06 LAB — BRAIN NATRIURETIC PEPTIDE: B NATRIURETIC PEPTIDE 5: 193.4 pg/mL — AB (ref 0.0–100.0)

## 2016-09-06 LAB — DIGOXIN LEVEL: Digoxin Level: 1 ng/mL (ref 0.8–2.0)

## 2016-09-06 MED ORDER — CARVEDILOL 3.125 MG PO TABS: 3.1250 mg | ORAL_TABLET | Freq: Two times a day (BID) | ORAL | 3 refills | Status: DC

## 2016-09-06 NOTE — Patient Instructions (Signed)
Start Carvedilol 3.125 mg Twice daily   Labs today  Your physician has requested that you have an echocardiogram. Echocardiography is a painless test that uses sound waves to create images of your heart. It provides your doctor with information about the size and shape of your heart and how well your heart's chambers and valves are working. This procedure takes approximately one hour. There are no restrictions for this procedure.  THIS MONTH  Kenneth Robertson, our social worker will follow up with you later today about referral  Your physician recommends that you schedule a follow-up appointment in: 6 weeks

## 2016-09-06 NOTE — Progress Notes (Signed)
Advanced Heart Failure Clinic Note   PCP: Dr. Leonides Schanz Cardiology: Dr. [name]  36 yo with history of HTN and nonischemic cardiomyopathy presents for CHF clinic followup.  He was admitted in 12/17 after 1 month of worsening dyspnea.  He was volume overloaded on exam with EF 10-15% on echo.  He was diuresed and RHC/LHC done.  This showed no CAD. Cardiac output was low.   Cardiac MRI in 4/18 showed severe LV dilation, EF 24%, prominent trabeculation (possible noncompaction), RV insertion LGE (nonspecific).    He is doing fairly well.  Weight is down 8 lbs.  He has been going to the [facility] for exercise.  No dyspnea walking on flat ground.  Mild dyspnea walking up stairs.  No orthopnea/PND.  No palpitations, no lightheadedness.  He has been stressed/depressed, wants to see a Veterinary surgeon.   No BRBPR/melena.   Labs (1/18): K 4.3, creatinine 1.47, BNP 243, TSH normal, HIV negative, SPEP negative Labs (4/18): BNP 390, hgb 14.4, K 4.3, creatinine 1.33  PMH: 1. HTN 2. Depression 3. Chronic systolic CHF: Nonischemic cardiomyopathy, diagnosed in 12/17.  - Echo (12/17) with EF 10-15%, LV thrombus, moderate MR, PASP 47 mmHg, moderately dilated and dysfunctional RV.  - LHC/RHC (12/17): No CAD; mean RA 8, PA 41/22, mean PCWP 20, CI 1.7.  - HIV, SPEP, TSH negative.  - Cardiac MRI in 4/18 showed severe LV dilation, EF 24%, prominent trabeculation (possible noncompaction), RV insertion LGE (nonspecific), no LV thrombus.   4. LV thrombus: On warfarin.   SH: On disability, nonsmoker, no ETOH/drugs.  FH: Mother with CHF but developed later in life (around 48).  No sudden cardiac death.   Review of systems complete and found to be negative unless listed in HPI.    Current Outpatient Prescriptions  Medication Sig Dispense Refill  . digoxin (LANOXIN) 0.125 MG tablet Take 1 tablet (0.125 mg total) by mouth daily. 30 tablet 3  . furosemide (LASIX) 40 MG tablet Take 1 tablet (40 mg total) by mouth 2 (two) times  daily. 60 tablet 3  . isosorbide-hydrALAZINE (BIDIL) 20-37.5 MG tablet Take 1 tablet by mouth 3 (three) times daily. 90 tablet 3  . KLOR-CON M20 20 MEQ tablet Take 20 mEq by mouth daily.  3  . losartan (COZAAR) 25 MG tablet Take 1 tablet (25 mg total) by mouth at bedtime. 30 tablet 3  . spironolactone (ALDACTONE) 25 MG tablet Take 1 tablet (25 mg total) by mouth daily. 30 tablet 3  . warfarin (COUMADIN) 5 MG tablet Take 2 tablets (10 mg total) by mouth daily. Take 1 tablet (5mg ) Tuesday 60 tablet 0  . carvedilol (COREG) 3.125 MG tablet Take 1 tablet (3.125 mg total) by mouth 2 (two) times daily. 60 tablet 3  . guaiFENesin-dextromethorphan (ROBITUSSIN DM) 100-10 MG/5ML syrup Take 5 mLs by mouth every 4 (four) hours as needed for cough. (Patient not taking: Reported on 06/08/2016) 118 mL 0  . ipratropium (ATROVENT) 0.06 % nasal spray Place 2 sprays into both nostrils 4 (four) times daily. (Patient not taking: Reported on 07/21/2016) 15 mL 12   No current facility-administered medications for this encounter.    BP 101/65   Pulse 87   Wt 245 lb 8 oz (111.4 kg)   SpO2 100%   BMI 35.23 kg/m    Wt Readings from Last 3 Encounters:  09/06/16 245 lb 8 oz (111.4 kg)  07/28/16 252 lb (114.3 kg)  07/21/16 253 lb 3.2 oz (114.9 kg)    Physical Exam  General: Tall AA male in NAD.  HEENT: Normal.  Neck: Supple. JVP not elevated. Carotids 2+ bilat; no bruits. No thyromegaly or nodule noted. Cor: PMI nonpalpable. Regular rate/rhythm, No M/G/R noted Lungs: CTAB, normal effort. Abdomen: Soft, non-tender, distended, no HSM. No bruits or masses. +BS  Extremities: No cyanosis, clubbing, rash, R and LLE no edema.  Neuro: Alert & orientedx3, cranial nerves grossly intact. moves all 4 extremities w/o difficulty. Affect pleasant   Assessment/Plan: 1. Chronic systolic CHF: Nonischemic cardiomyopathy.  EF 10-15% by echo in 12/17.  No coronary disease and low output on cath.  HIV negative, SPEP negative.  No  family history of early cardiomyopathy (mother with CHF around 94).  No ETOH/drugs. Possible viral myocarditis.  Cardiac MRI in 4/18 with severe LV dilation, EF 24%, RV insertion site LGE (nonspecific), cannot rule out noncompaction. NYHA Class II symptoms overall and volume status stable on exam.    - Continue digoxin 0.125 daily. Check level.     - Continue lasix 40 mg BID and KCl 20 meq daily. BMET today.   - Continue losartan 25 mg daily - Continue spironolactone 25 mg daily.  - Continue Bidil 1 mg tab TID.   - Add Coreg 3.125 mg bid.  - Echo later this month, if EF remains < 35% would favor ICD given young age even though he has a nonischemic cardiomyopathy.  Narrow QRS so not CRT candidate.   2. LV thrombus:  Continue coumadin. Not seen on cMRI in 4/18.   3. Depression: Will have our social worker talk to him about mental health counseling options.   Marca Ancona, MD  09/06/2016

## 2016-09-07 ENCOUNTER — Other Ambulatory Visit: Payer: Self-pay | Admitting: Family Medicine

## 2016-09-07 ENCOUNTER — Telehealth: Payer: Self-pay | Admitting: *Deleted

## 2016-09-07 ENCOUNTER — Telehealth: Payer: Self-pay | Admitting: Licensed Clinical Social Worker

## 2016-09-07 NOTE — Telephone Encounter (Signed)
Tried calling patient on both numbers listed but was unable to reach him and there was no voicemail available.  Will continue to try and reach patient this week and will mail a letter if needed. Jazmin Hartsell,CMA

## 2016-09-07 NOTE — Telephone Encounter (Signed)
CSW referred by MD to provide patient with resources for counseling/mental health. CSW has attempted multiple times to reach patient to provide information with no success or ability to leave message. CSW will continue to attempt contact. Lasandra Beech, LCSW, CCSW-MCS [phone]

## 2016-09-07 NOTE — Telephone Encounter (Signed)
-----   Message from Tillman Sers, DO sent at 09/07/2016  9:00 AM EDT -----  Rx request for warfarin denied- needs to come in to get his INR checked or we cannot fill this rx. Also would like to see him in clinic for a visit if you are able to reach him.

## 2016-09-10 NOTE — Progress Notes (Signed)
[facility] [employer] PRE[facility] Weekly Session   Patient Details  Name: Kenneth Robertson MRN: [id number] Date of Birth: 28-Oct-1980 Age: 36 y.o. PCP: Tillman Sers, DO  Vitals:   09/08/16 1136  Weight: 245 lb (111.1 kg)        [name] Weekly seesion - 09/10/16 1100      Weekly Session   Topic Discussed Stress management and problem solving   Minutes exercised this week 420 minutes  240cardio/180strength   Classes attended to date 4     Fun things you did since last meeting:"walk w/friends a mile a day" Things you are grateful for:"grateful that I could walk a mile.  Lost 8 pounds!" Nutrition celebrations for the week:"been eating oatmeal for breakfast" Barriers:"trying to run longer"  Rose Fillers 09/10/2016, 11:37 AM

## 2016-09-21 ENCOUNTER — Other Ambulatory Visit (HOSPITAL_COMMUNITY): Payer: Self-pay | Admitting: *Deleted

## 2016-09-21 ENCOUNTER — Inpatient Hospital Stay (HOSPITAL_COMMUNITY): Admission: RE | Admit: 2016-09-21 | Payer: Medicare Other | Source: Ambulatory Visit

## 2016-09-21 ENCOUNTER — Ambulatory Visit (HOSPITAL_COMMUNITY): Admission: RE | Admit: 2016-09-21 | Payer: Medicare Other | Source: Ambulatory Visit

## 2016-09-21 DIAGNOSIS — I5022 Chronic systolic (congestive) heart failure: Secondary | ICD-10-CM

## 2016-09-23 ENCOUNTER — Ambulatory Visit (INDEPENDENT_AMBULATORY_CARE_PROVIDER_SITE_OTHER): Payer: Medicare Other | Admitting: *Deleted

## 2016-09-23 ENCOUNTER — Telehealth: Payer: Self-pay | Admitting: *Deleted

## 2016-09-23 ENCOUNTER — Other Ambulatory Visit: Payer: Self-pay | Admitting: *Deleted

## 2016-09-23 DIAGNOSIS — I513 Intracardiac thrombosis, not elsewhere classified: Secondary | ICD-10-CM | POA: Diagnosis present

## 2016-09-23 LAB — POCT INR: INR: 1.3

## 2016-09-23 MED ORDER — WARFARIN SODIUM 5 MG PO TABS: 10.0000 mg | ORAL_TABLET | Freq: Every day | ORAL | 0 refills | Status: DC

## 2016-09-23 NOTE — Progress Notes (Signed)
Dr Leveda Anna spoke with patient and stressed the importance of him coming in regularly to have his INR checked. He has a history of not showing up to his appointments for the coumadin clinic until he has run out of medication for several days. Rx sent in to his pharmacy and patient says he will follow up next Friday for a recheck. Dosing instructions were written out and given to him. Vannesa Abair, Rodena Medin

## 2016-09-23 NOTE — Progress Notes (Signed)
Discussed with patient barriers to compliance.  Agreed to work together.

## 2016-09-23 NOTE — Telephone Encounter (Signed)
Pt is here today for a coumadin check. He has been out of meds for a "few days" he is unable to afford a rx refill.  Contacted [facility], it would cost $3 for him to pick up the refill provided by Dr. Leveda Anna.  Gave pt $3 to get the medication @ [facility].   He was very appreciative.  Yellow form completed and placed in designated place in Garceno Richardson's office. Kailan Carmen, Maryjo Rochester, CMA

## 2016-09-29 NOTE — Progress Notes (Signed)
[facility] [employer] PRE[facility] Weekly Session   Patient Details  Name: Kenneth Robertson MRN: [id number] Date of Birth: 10/21/1980 Age: 36 y.o. PCP: Tillman Sers, DO  Vitals:   09/29/16 1311  Weight: 240 lb (108.9 kg)        [name] Weekly seesion - 09/29/16 1300      Weekly Session   Topic Discussed Expectations and non-scale victories   Minutes exercised this week 600 minutes  cardio   Classes attended to date 5     Things you are grateful for "just to be feeling better" Nutrition celebrations for the week:"substitution grapes for sweets"  Rose Fillers 09/29/2016, 1:11 PM

## 2016-09-30 ENCOUNTER — Other Ambulatory Visit (HOSPITAL_COMMUNITY): Payer: Self-pay | Admitting: Cardiology

## 2016-10-01 ENCOUNTER — Ambulatory Visit: Payer: Medicare Other

## 2016-10-04 ENCOUNTER — Ambulatory Visit (INDEPENDENT_AMBULATORY_CARE_PROVIDER_SITE_OTHER): Payer: Medicare Other | Admitting: *Deleted

## 2016-10-04 DIAGNOSIS — Z7901 Long term (current) use of anticoagulants: Secondary | ICD-10-CM

## 2016-10-04 LAB — POCT INR: INR: 4.2

## 2016-10-07 ENCOUNTER — Ambulatory Visit (HOSPITAL_COMMUNITY): Payer: Medicare Other

## 2016-10-07 ENCOUNTER — Other Ambulatory Visit (HOSPITAL_COMMUNITY): Payer: Medicare Other

## 2016-10-11 ENCOUNTER — Ambulatory Visit (INDEPENDENT_AMBULATORY_CARE_PROVIDER_SITE_OTHER): Payer: Medicare Other | Admitting: *Deleted

## 2016-10-11 DIAGNOSIS — Z7901 Long term (current) use of anticoagulants: Secondary | ICD-10-CM

## 2016-10-11 DIAGNOSIS — I513 Intracardiac thrombosis, not elsewhere classified: Secondary | ICD-10-CM

## 2016-10-11 LAB — POCT INR: INR: 3.4

## 2016-10-17 ENCOUNTER — Other Ambulatory Visit (HOSPITAL_COMMUNITY): Payer: Self-pay | Admitting: Cardiology

## 2016-10-19 ENCOUNTER — Encounter (HOSPITAL_COMMUNITY): Payer: Medicare Other | Admitting: Cardiology

## 2016-10-20 ENCOUNTER — Other Ambulatory Visit: Payer: Self-pay | Admitting: Family Medicine

## 2016-10-20 ENCOUNTER — Encounter: Payer: Self-pay | Admitting: Licensed Clinical Social Worker

## 2016-10-20 NOTE — Progress Notes (Signed)
Patient came to clinic to meet with CSW for assistance with pending eviction. Patient is a 36 yo male who is on SSI, food stamps and medicaid. Patient lives alone and has managed monthly rent and expenses for 4 years now although recently charged for a Medical laboratory scientific officer and a pest control bill for an infestation in his apartment. Patient was unable to meet the financial responsibility of the added bills with his limited income. He barely makes ends meet without added expenses. Patient is trying to obtain section 8 although lengthy process. CSW assisted patient through HF fund to cover costs and he was able to make monthly rent. Patient very grateful for assistance and avoiding eviction. CSW encouraged patient to follow up with CSW as needed and continue with current HF regimen and exercise at the Merla Riches, LCSW, CCSW-MCS [phone]

## 2016-10-25 ENCOUNTER — Ambulatory Visit (HOSPITAL_COMMUNITY): Admission: RE | Admit: 2016-10-25 | Payer: Medicare Other | Source: Ambulatory Visit

## 2016-10-25 ENCOUNTER — Ambulatory Visit: Payer: Medicare Other

## 2016-10-25 ENCOUNTER — Other Ambulatory Visit (HOSPITAL_COMMUNITY): Payer: Medicare Other

## 2016-10-26 ENCOUNTER — Ambulatory Visit (HOSPITAL_COMMUNITY)
Admission: RE | Admit: 2016-10-26 | Discharge: 2016-10-26 | Disposition: A | Discharge status: Home / Self Care | Payer: Medicare Other | Source: Ambulatory Visit | Attending: Cardiology | Admitting: Cardiology

## 2016-10-26 ENCOUNTER — Ambulatory Visit (INDEPENDENT_AMBULATORY_CARE_PROVIDER_SITE_OTHER): Payer: Medicare Other | Admitting: *Deleted

## 2016-10-26 DIAGNOSIS — I5030 Unspecified diastolic (congestive) heart failure: Secondary | ICD-10-CM | POA: Diagnosis not present

## 2016-10-26 DIAGNOSIS — I088 Other rheumatic multiple valve diseases: Secondary | ICD-10-CM | POA: Insufficient documentation

## 2016-10-26 DIAGNOSIS — I272 Pulmonary hypertension, unspecified: Secondary | ICD-10-CM | POA: Diagnosis not present

## 2016-10-26 DIAGNOSIS — I5022 Chronic systolic (congestive) heart failure: Secondary | ICD-10-CM

## 2016-10-26 DIAGNOSIS — Z7901 Long term (current) use of anticoagulants: Secondary | ICD-10-CM | POA: Diagnosis present

## 2016-10-26 DIAGNOSIS — I420 Dilated cardiomyopathy: Secondary | ICD-10-CM | POA: Insufficient documentation

## 2016-10-26 LAB — POCT INR: INR: 3.2

## 2016-10-26 LAB — BASIC METABOLIC PANEL
Anion gap: 7 (ref 5–15)
BUN: 12 mg/dL (ref 6–20)
CALCIUM: 9.3 mg/dL (ref 8.9–10.3)
CO2: 28 mmol/L (ref 22–32)
CREATININE: 1.4 mg/dL — AB (ref 0.61–1.24)
Chloride: 103 mmol/L (ref 101–111)
GFR calc Af Amer: >60 mL/min (ref >60)
Glucose, Bld: 110 mg/dL — ABNORMAL HIGH (ref 65–99)
Potassium: 4.3 mmol/L (ref 3.5–5.1)
Sodium: 138 mmol/L (ref 135–145)

## 2016-10-26 NOTE — Progress Notes (Signed)
  Echocardiogram 2D Echocardiogram has been performed.  Kenneth Robertson M 10/26/2016, 10:02 AM

## 2016-11-01 NOTE — Progress Notes (Signed)
[facility] [employer] PREP Weekly Session   Patient Details  Name: Kenneth Robertson MRN: [id number] Date of Birth: 05-11-1980 Age: 36 y.o. PCP: Tillman Sers, DO  Vitals:   11/01/16 1145  Weight: 248 lb (112.5 kg)        [name] Weekly seesion - 11/01/16 1100      Weekly Session   Topic Discussed Calorie breakdown   Minutes exercised this week --  "24hours cardio/3 hours flexibility"   Classes attended to date 6     Fun things you did since last meeting:"swimming" Things you are grateful for:"breathing and singing much better" Barriers:"stress"   Rose Fillers 11/01/2016, 11:46 AM

## 2016-11-05 ENCOUNTER — Other Ambulatory Visit: Payer: Self-pay | Admitting: *Deleted

## 2016-11-05 ENCOUNTER — Other Ambulatory Visit: Payer: Self-pay | Admitting: Family Medicine

## 2016-11-08 ENCOUNTER — Encounter (HOSPITAL_COMMUNITY): Payer: Medicare Other | Admitting: Cardiology

## 2016-11-09 ENCOUNTER — Ambulatory Visit: Payer: Medicare Other

## 2016-11-10 ENCOUNTER — Ambulatory Visit (HOSPITAL_COMMUNITY)
Admission: RE | Admit: 2016-11-10 | Discharge: 2016-11-10 | Disposition: A | Discharge status: Home / Self Care | Payer: Medicare Other | Source: Ambulatory Visit | Attending: Cardiology | Admitting: Cardiology

## 2016-11-10 ENCOUNTER — Encounter (HOSPITAL_COMMUNITY): Payer: Self-pay | Admitting: Cardiology

## 2016-11-10 VITALS — BP 83/56 | HR 95 | Wt 252.2 lb

## 2016-11-10 DIAGNOSIS — I428 Other cardiomyopathies: Secondary | ICD-10-CM | POA: Insufficient documentation

## 2016-11-10 DIAGNOSIS — Z79899 Other long term (current) drug therapy: Secondary | ICD-10-CM | POA: Diagnosis not present

## 2016-11-10 DIAGNOSIS — F329 Major depressive disorder, single episode, unspecified: Secondary | ICD-10-CM | POA: Diagnosis not present

## 2016-11-10 DIAGNOSIS — Z7901 Long term (current) use of anticoagulants: Secondary | ICD-10-CM | POA: Insufficient documentation

## 2016-11-10 DIAGNOSIS — I513 Intracardiac thrombosis, not elsewhere classified: Secondary | ICD-10-CM | POA: Insufficient documentation

## 2016-11-10 DIAGNOSIS — I110 Hypertensive heart disease with heart failure: Secondary | ICD-10-CM | POA: Diagnosis not present

## 2016-11-10 DIAGNOSIS — I5022 Chronic systolic (congestive) heart failure: Secondary | ICD-10-CM

## 2016-11-10 LAB — BASIC METABOLIC PANEL
ANION GAP: 9 (ref 5–15)
BUN: 14 mg/dL (ref 6–20)
CO2: 23 mmol/L (ref 22–32)
Calcium: 8.9 mg/dL (ref 8.9–10.3)
Chloride: 106 mmol/L (ref 101–111)
Creatinine, Ser: 1.58 mg/dL — ABNORMAL HIGH (ref 0.61–1.24)
GFR, EST NON AFRICAN AMERICAN: 54 mL/min — AB (ref >60)
GLUCOSE: 90 mg/dL (ref 65–99)
POTASSIUM: 3.7 mmol/L (ref 3.5–5.1)
Sodium: 138 mmol/L (ref 135–145)

## 2016-11-10 LAB — DIGOXIN LEVEL: DIGOXIN LVL: 0.8 ng/mL (ref 0.8–2.0)

## 2016-11-10 MED ORDER — FUROSEMIDE 40 MG PO TABS: 60.0000 mg | ORAL_TABLET | Freq: Two times a day (BID) | ORAL | 3 refills | Status: DC

## 2016-11-10 NOTE — Patient Instructions (Addendum)
Labs today (will call for abnormal results, otherwise no news is good news)  INCREASE Lasix to 60 mg (1.5 Tablet) Twice Daily  Please see sheet on which medications to avoid.  You have been referred to see EP, they will contact you to schedule appointment.   Labs in 10 days (bmet)  Follow up in 1 Month

## 2016-11-10 NOTE — Progress Notes (Signed)
Advanced Heart Failure Clinic Note   PCP: Dr. Leonides Schanz Cardiology: Dr. [name]  36 yo with history of HTN and nonischemic cardiomyopathy presents for CHF clinic followup.  He was admitted in 12/17 after 1 month of worsening dyspnea.  He was volume overloaded on exam with EF 10-15% on echo.  He was diuresed and RHC/LHC done.  This showed no CAD. Cardiac output was low.   Cardiac MRI in 4/18 showed severe LV dilation, EF 24%, prominent trabeculation (possible noncompaction), RV insertion LGE (nonspecific).    He returns today for HF follow up, he has a cold today and is feeling under the weather. He took a medication last night with a decongestant and felt tachycardiac. No SOB with walking into clinic, denies orthopnea. Weights at home 250-252 pounds. He took extra lasix last week for volume overload.  Eating mostly soup, drinking less than 2L a day. Denies dizziness, lightheadedness.   Labs (1/18): K 4.3, creatinine 1.47, BNP 243, TSH normal, HIV negative, SPEP negative Labs (4/18): BNP 390, hgb 14.4, K 4.3, creatinine 1.33 Labs (7/18): K 4.3, creatinine 1.4  PMH: 1. HTN 2. Depression 3. Chronic systolic CHF: Nonischemic cardiomyopathy, diagnosed in 12/17.  - Echo (12/17) with EF 10-15%, LV thrombus, moderate MR, PASP 47 mmHg, moderately dilated and dysfunctional RV.  - Echo (7/18) with EF 15%, moderate MR. PASP 40 mm Hg.  - LHC/RHC (12/17): No CAD; mean RA 8, PA 41/22, mean PCWP 20, CI 1.7.  - HIV, SPEP, TSH negative.  - Cardiac MRI in 4/18 showed severe LV dilation, EF 24%, prominent trabeculation (possible noncompaction), RV insertion LGE (nonspecific), no LV thrombus.   - Echo (7/18): EF 15%, severe LV dilation with diffuse HK, grade 3 diastolic dysfunction, mild-moderate MR, PASP 40 mmHg.  4. LV thrombus: On warfarin.   SH: On disability, nonsmoker, no ETOH/drugs.  FH: Mother with CHF but developed later in life (around 27).  No sudden cardiac death.   Review of systems complete and  found to be negative unless listed in HPI.    Current Outpatient Prescriptions  Medication Sig Dispense Refill  . carvedilol (COREG) 3.125 MG tablet Take 1 tablet (3.125 mg total) by mouth 2 (two) times daily. 60 tablet 3  . digoxin (LANOXIN) 0.125 MG tablet TAKE 1 TABLET BY MOUTH EVERY DAY 30 tablet 3  . furosemide (LASIX) 40 MG tablet Take 1 tablet (40 mg total) by mouth 2 (two) times daily. 60 tablet 3  . isosorbide-hydrALAZINE (BIDIL) 20-37.5 MG tablet Take 1 tablet by mouth 3 (three) times daily. 90 tablet 3  . KLOR-CON M20 20 MEQ tablet Take 20 mEq by mouth daily.  3  . losartan (COZAAR) 25 MG tablet Take 1 tablet (25 mg total) by mouth at bedtime. 30 tablet 3  . spironolactone (ALDACTONE) 25 MG tablet TAKE 1 TABLET BY MOUTH EVERY DAY 30 tablet 3  . warfarin (COUMADIN) 5 MG tablet TAKE 2 TABLETS (10 MG TOTAL) BY MOUTH DAILY. TAKE 1 TABLET (5MG ) TUESDAY 60 tablet 0  . guaiFENesin-dextromethorphan (ROBITUSSIN DM) 100-10 MG/5ML syrup Take 5 mLs by mouth every 4 (four) hours as needed for cough. (Patient not taking: Reported on 06/08/2016) 118 mL 0  . ipratropium (ATROVENT) 0.06 % nasal spray Place 2 sprays into both nostrils 4 (four) times daily. (Patient not taking: Reported on 07/21/2016) 15 mL 12   No current facility-administered medications for this encounter.    BP (!) 83/56   Pulse 95   Wt 252 lb 4 oz (114.4  kg)   SpO2 99%   BMI 36.19 kg/m    Wt Readings from Last 3 Encounters:  11/10/16 252 lb 4 oz (114.4 kg)  11/01/16 248 lb (112.5 kg)  09/29/16 240 lb (108.9 kg)    Physical Exam General:male, NAD.  HEENT: Normal.  Neck: Supple, JVP 10-11 cm. Carotids 2+ bilat; no bruits. No thyromegaly or nodule noted. Cor: PMI nonpalpable. Regular rate and rhythm.  No M/G/R noted Lungs: Clear bilaterally. Normal effort.  Abdomen: Soft, non-tender, distended, no HSM. No bruits or masses. +BS  Extremities: No cyanosis, clubbing, rash, R and LLE no edema.  Neuro: Alert & orientedx3,  cranial nerves grossly intact. moves all 4 extremities w/o difficulty. Affect pleasant   Assessment/Plan: 1. Chronic systolic CHF: Nonischemic cardiomyopathy.  EF 10-15% by echo in 12/17.  No coronary disease and low output on cath.  HIV negative, SPEP negative.  No family history of early cardiomyopathy (mother with CHF around 65).  No ETOH/drugs. Possible viral myocarditis.  Cardiac MRI in 4/18 with severe LV dilation, EF 24%, RV insertion site LGE (nonspecific), cannot rule out noncompaction.  - NYHA II - Volume status elevated on exam, likely due to high salt diet (has been eating mostly canned soup), increase Lasix to 60 mg BID. Encouraged him to follow a low sodium diet. Check BMET today and again in 10 days.  - Continue KCl 20 mEq daily.  - Continue digoxin. Check level today.  - Continue losartan 25 mg daily. BP too low for Entresto.  - Continue Bidil 1 tab TID - Continue spiro 25 mg daily - Continue Coreg 3.125 mg BID - Refer to EP for ICD. EF remains 15% on recent Echo.             2. LV thrombus: Not seen on cMRI in 4/18.   - Continue warfarin for anticoagulation   3. Depression: - Improved.   Follow up in one month.   Kenneth Ishikawa, NP  11/10/2016  Patient seen with NP, agree with the above note.   Mild volume overload, I will increase Lasix to 60 mg bid.  BMET today and in 10 days.   No BP room to titrate his HF meds.  He denies lightheadedness though BP soft today.    EF is persistently low.  I will refer him to EP for ICD consideration (narrow QRS, no CRT).  I would favor a Research officer, political party for [employer].   Kenneth Robertson 11/13/2016

## 2016-11-22 ENCOUNTER — Inpatient Hospital Stay (HOSPITAL_COMMUNITY): Admission: RE | Admit: 2016-11-22 | Payer: Medicare Other | Source: Ambulatory Visit

## 2016-11-23 ENCOUNTER — Ambulatory Visit (HOSPITAL_COMMUNITY)
Admission: RE | Admit: 2016-11-23 | Discharge: 2016-11-23 | Disposition: A | Discharge status: Home / Self Care | Payer: Medicare Other | Source: Ambulatory Visit | Attending: Cardiology | Admitting: Cardiology

## 2016-11-23 DIAGNOSIS — I5022 Chronic systolic (congestive) heart failure: Secondary | ICD-10-CM | POA: Insufficient documentation

## 2016-11-23 LAB — BASIC METABOLIC PANEL
ANION GAP: 4 — AB (ref 5–15)
BUN: 13 mg/dL (ref 6–20)
CHLORIDE: 108 mmol/L (ref 101–111)
CO2: 27 mmol/L (ref 22–32)
Calcium: 9.1 mg/dL (ref 8.9–10.3)
Creatinine, Ser: 1.37 mg/dL — ABNORMAL HIGH (ref 0.61–1.24)
Glucose, Bld: 90 mg/dL (ref 65–99)
POTASSIUM: 4.2 mmol/L (ref 3.5–5.1)
SODIUM: 139 mmol/L (ref 135–145)

## 2016-11-25 ENCOUNTER — Telehealth: Payer: Self-pay | Admitting: Licensed Clinical Social Worker
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
## 2016-12-07 ENCOUNTER — Institutional Professional Consult (permissible substitution): Payer: Medicare Other | Admitting: Internal Medicine
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
## 2016-12-08 ENCOUNTER — Encounter: Payer: Self-pay | Admitting: Internal Medicine

## 2016-12-13 ENCOUNTER — Inpatient Hospital Stay (HOSPITAL_COMMUNITY): Admission: RE | Admit: 2016-12-13 | Payer: Medicare Other | Source: Ambulatory Visit

## 2016-12-15 ENCOUNTER — Ambulatory Visit (INDEPENDENT_AMBULATORY_CARE_PROVIDER_SITE_OTHER): Payer: Medicare Other | Admitting: *Deleted

## 2016-12-15 DIAGNOSIS — Z7901 Long term (current) use of anticoagulants: Secondary | ICD-10-CM

## 2016-12-15 LAB — POCT INR: INR: 2.8

## 2016-12-16 ENCOUNTER — Other Ambulatory Visit: Payer: Self-pay | Admitting: Family Medicine

## 2016-12-29 ENCOUNTER — Ambulatory Visit: Payer: Medicare Other

## 2016-12-31 ENCOUNTER — Ambulatory Visit (INDEPENDENT_AMBULATORY_CARE_PROVIDER_SITE_OTHER): Payer: Medicare Other | Admitting: *Deleted

## 2016-12-31 DIAGNOSIS — Z7901 Long term (current) use of anticoagulants: Secondary | ICD-10-CM

## 2016-12-31 LAB — POCT INR: INR: 2.5

## 2017-01-04 ENCOUNTER — Encounter (HOSPITAL_COMMUNITY): Payer: Self-pay | Admitting: Emergency Medicine

## 2017-01-04 DIAGNOSIS — Z5321 Procedure and treatment not carried out due to patient leaving prior to being seen by health care provider: Secondary | ICD-10-CM | POA: Insufficient documentation

## 2017-01-04 DIAGNOSIS — R079 Chest pain, unspecified: Secondary | ICD-10-CM | POA: Diagnosis not present

## 2017-01-04 LAB — CBC
HEMATOCRIT: 41.5 % (ref 39.0–52.0)
Hemoglobin: 14.5 g/dL (ref 13.0–17.0)
MCH: 29.4 pg (ref 26.0–34.0)
MCHC: 34.9 g/dL (ref 30.0–36.0)
MCV: 84 fL (ref 78.0–100.0)
PLATELETS: 266 10*3/uL (ref 150–400)
RBC: 4.94 MIL/uL (ref 4.22–5.81)
RDW: 14 % (ref 11.5–15.5)
WBC: 4.4 10*3/uL (ref 4.0–10.5)

## 2017-01-04 NOTE — ED Triage Notes (Signed)
Pt reports central chest pressure present X 30 min. Pain started after eating tacos. Pressure 4/10, started on L side of chest and radiated to central chest. Pt has significant cardiac hx considering age (CHF, HTN, thrombus in heart chamber, pt takes coumadin)

## 2017-01-05 ENCOUNTER — Emergency Department (HOSPITAL_COMMUNITY): Payer: Medicare Other

## 2017-01-05 ENCOUNTER — Emergency Department (HOSPITAL_COMMUNITY)
Admission: EM | Admit: 2017-01-05 | Discharge: 2017-01-05 | Disposition: A | Discharge status: Home / Self Care | Payer: Medicare Other | Attending: Emergency Medicine | Admitting: Emergency Medicine

## 2017-01-05 DIAGNOSIS — R079 Chest pain, unspecified: Secondary | ICD-10-CM | POA: Diagnosis not present

## 2017-01-05 DIAGNOSIS — Z5321 Procedure and treatment not carried out due to patient leaving prior to being seen by health care provider: Secondary | ICD-10-CM | POA: Diagnosis not present

## 2017-01-05 LAB — BASIC METABOLIC PANEL
ANION GAP: 3 — AB (ref 5–15)
BUN: 12 mg/dL (ref 6–20)
CALCIUM: 9.1 mg/dL (ref 8.9–10.3)
CHLORIDE: 104 mmol/L (ref 101–111)
CO2: 25 mmol/L (ref 22–32)
CREATININE: 1.31 mg/dL — AB (ref 0.61–1.24)
GFR calc non Af Amer: >60 mL/min (ref >60)
Glucose, Bld: 142 mg/dL — ABNORMAL HIGH (ref 65–99)
Potassium: 3.9 mmol/L (ref 3.5–5.1)
SODIUM: 132 mmol/L — AB (ref 135–145)

## 2017-01-05 LAB — I-STAT TROPONIN, ED: TROPONIN I, POC: 0.03 ng/mL (ref 0.00–0.08)

## 2017-01-14 ENCOUNTER — Ambulatory Visit (INDEPENDENT_AMBULATORY_CARE_PROVIDER_SITE_OTHER): Payer: Medicare Other | Admitting: *Deleted

## 2017-01-14 DIAGNOSIS — Z7901 Long term (current) use of anticoagulants: Secondary | ICD-10-CM | POA: Diagnosis present

## 2017-01-14 LAB — POCT INR: INR: 3

## 2017-01-28 ENCOUNTER — Ambulatory Visit (INDEPENDENT_AMBULATORY_CARE_PROVIDER_SITE_OTHER): Payer: Medicare Other | Admitting: *Deleted

## 2017-01-28 DIAGNOSIS — I513 Intracardiac thrombosis, not elsewhere classified: Secondary | ICD-10-CM | POA: Diagnosis not present

## 2017-01-28 DIAGNOSIS — Z7901 Long term (current) use of anticoagulants: Secondary | ICD-10-CM | POA: Diagnosis not present

## 2017-01-28 LAB — POCT INR: INR: 5.4

## 2017-01-29 LAB — PROTIME-INR
INR: 4.6 — AB (ref 0.8–1.2)
Prothrombin Time: 43.4 s — ABNORMAL HIGH (ref 9.1–12.0)

## 2017-01-31 ENCOUNTER — Ambulatory Visit: Payer: Medicaid Other | Admitting: Family Medicine

## 2017-02-01 ENCOUNTER — Encounter: Payer: Self-pay | Admitting: Family Medicine

## 2017-02-01 ENCOUNTER — Other Ambulatory Visit: Payer: Self-pay | Admitting: Family Medicine

## 2017-02-07 ENCOUNTER — Other Ambulatory Visit (HOSPITAL_COMMUNITY): Payer: Self-pay | Admitting: *Deleted

## 2017-02-07 MED ORDER — DIGOXIN 125 MCG PO TABS: 125.0000 ug | ORAL_TABLET | Freq: Every day | ORAL | 3 refills | Status: DC

## 2017-02-08 ENCOUNTER — Ambulatory Visit: Payer: Medicaid Other

## 2017-02-11 ENCOUNTER — Other Ambulatory Visit (HOSPITAL_COMMUNITY): Payer: Self-pay | Admitting: *Deleted

## 2017-02-11 MED ORDER — KLOR-CON M20 20 MEQ PO TBCR: 20.0000 meq | EXTENDED_RELEASE_TABLET | Freq: Every day | ORAL | 3 refills | Status: DC

## 2017-02-21 ENCOUNTER — Ambulatory Visit (INDEPENDENT_AMBULATORY_CARE_PROVIDER_SITE_OTHER): Payer: Medicaid Other | Admitting: *Deleted

## 2017-02-21 ENCOUNTER — Other Ambulatory Visit (HOSPITAL_COMMUNITY): Payer: Self-pay | Admitting: *Deleted

## 2017-02-21 ENCOUNTER — Ambulatory Visit (HOSPITAL_COMMUNITY)
Admission: RE | Admit: 2017-02-21 | Discharge: 2017-02-21 | Disposition: A | Discharge status: Home / Self Care | Payer: Medicare Other | Source: Ambulatory Visit | Attending: Cardiology | Admitting: Cardiology

## 2017-02-21 DIAGNOSIS — I5022 Chronic systolic (congestive) heart failure: Secondary | ICD-10-CM

## 2017-02-21 DIAGNOSIS — Z7901 Long term (current) use of anticoagulants: Secondary | ICD-10-CM | POA: Diagnosis not present

## 2017-02-21 LAB — BASIC METABOLIC PANEL
ANION GAP: 7 (ref 5–15)
BUN: 13 mg/dL (ref 6–20)
CALCIUM: 9.4 mg/dL (ref 8.9–10.3)
CO2: 25 mmol/L (ref 22–32)
CREATININE: 1.42 mg/dL — AB (ref 0.61–1.24)
Chloride: 105 mmol/L (ref 101–111)
GFR calc Af Amer: >60 mL/min (ref >60)
GFR calc non Af Amer: >60 mL/min (ref >60)
GLUCOSE: 108 mg/dL — AB (ref 65–99)
Potassium: 4 mmol/L (ref 3.5–5.1)
Sodium: 137 mmol/L (ref 135–145)

## 2017-02-21 MED ORDER — LOSARTAN POTASSIUM 25 MG PO TABS: 25.0000 mg | ORAL_TABLET | Freq: Every day | ORAL | 3 refills | Status: DC

## 2017-02-22 LAB — PROTIME-INR
INR: 3.2 — AB (ref 0.8–1.2)
PROTHROMBIN TIME: 31.5 s — AB (ref 9.1–12.0)

## 2017-03-01 ENCOUNTER — Ambulatory Visit: Payer: Medicaid Other

## 2017-03-17 ENCOUNTER — Other Ambulatory Visit (HOSPITAL_COMMUNITY): Payer: Self-pay | Admitting: Student

## 2017-04-15 ENCOUNTER — Other Ambulatory Visit (HOSPITAL_COMMUNITY): Payer: Self-pay | Admitting: *Deleted

## 2017-04-15 MED ORDER — FUROSEMIDE 40 MG PO TABS: 60.0000 mg | ORAL_TABLET | Freq: Two times a day (BID) | ORAL | 3 refills | Status: DC

## 2017-04-20 ENCOUNTER — Other Ambulatory Visit: Payer: Self-pay

## 2017-04-20 ENCOUNTER — Ambulatory Visit (INDEPENDENT_AMBULATORY_CARE_PROVIDER_SITE_OTHER): Payer: Medicare Other | Admitting: Family Medicine

## 2017-04-20 ENCOUNTER — Encounter: Payer: Self-pay | Admitting: Family Medicine

## 2017-04-20 VITALS — BP 140/84 | HR 85 | Temp 98.0°F | Wt 266.0 lb

## 2017-04-20 DIAGNOSIS — I5022 Chronic systolic (congestive) heart failure: Secondary | ICD-10-CM | POA: Diagnosis present

## 2017-04-20 DIAGNOSIS — K219 Gastro-esophageal reflux disease without esophagitis: Secondary | ICD-10-CM | POA: Insufficient documentation

## 2017-04-20 LAB — POCT INR: INR: 3.3

## 2017-04-20 MED ORDER — FAMOTIDINE 20 MG PO TABS: 20.0000 mg | ORAL_TABLET | Freq: Two times a day (BID) | ORAL | 2 refills | Status: DC

## 2017-04-20 NOTE — Progress Notes (Signed)
    Subjective:  Kenneth Robertson is a 37 y.o. male who presents to the [facility] today with a chief complaint of stomach issues.   HPI:  Patient states that he has been having stomach issues with bloating intermittently since October 2018. He noticed that he was having some midepigastric pain and sour taste in the back of his throat as well which interfered with his singing voice. He states this particularly worsened with spicy foods, greasy foods, meat. The pain and sour taste improved after he stopped eating meat on 04/06/17 but he feels some discomfort.  Has tried liquid laxative for this. Has never had constipation. Has regular BMs. No nausea or vomiting. No fever or cills    ROS: Per HPI  Objective:  Physical Exam: BP 140/84   Pulse 85   Temp 98 F (36.7 C) (Oral)   Wt 266 lb (120.7 kg)   SpO2 97%   BMI 37.10 kg/m   Gen: NAD, resting comfortably CV: RRR with no murmurs appreciated Pulm: NWOB, CTAB with no crackles, wheezes, or rhonchi GI: Normal bowel sounds present. Soft, Nontender, Nondistended. MSK: no edema, cyanosis, or clubbing noted Skin: warm, dry  Assessment/Plan:  Acid reflux Abdominal exam benign today and history with sour taste and midepigastric pain is most consistent with acid reflux. No diarrhea or nausea/vomiting or fever/chills to suggest intraabdominal infection and patient is well appearing on exam. Stools are normal and regular BMs so unlikely constipation. Start famotidine 20mg  qd and patient instructed can titrate up to BID to see if he has any symptomatic relief. Patient also given education on which foods to avoid to help manage his symptoms.   Leland Her, DO PGY-2, [facility] 04/20/2017 4:05 PM

## 2017-04-20 NOTE — Patient Instructions (Signed)
Famotidine 20mg  try once day if you need you can go up to twice a day.    Gastroesophageal Reflux Disease, Adult Normally, food travels down the esophagus and stays in the stomach to be digested. If a person has gastroesophageal reflux disease (GERD), food and stomach acid move back up into the esophagus. When this happens, the esophagus becomes sore and swollen (inflamed). Over time, GERD can make small holes (ulcers) in the lining of the esophagus. Follow these instructions at home: Diet  Follow a diet as told by your doctor. You may need to avoid foods and drinks such as: ? Coffee and tea (with or without caffeine). ? Drinks that contain alcohol. ? Energy drinks and sports drinks. ? Carbonated drinks or sodas. ? Chocolate and cocoa. ? Peppermint and mint flavorings. ? Garlic and onions. ? Horseradish. ? Spicy and acidic foods, such as peppers, chili powder, curry powder, vinegar, hot sauces, and BBQ sauce. ? Citrus fruit juices and citrus fruits, such as oranges, lemons, and limes. ? Tomato-based foods, such as red sauce, chili, salsa, and pizza with red sauce. ? Fried and fatty foods, such as donuts, french fries, potato chips, and high-fat dressings. ? High-fat meats, such as hot dogs, rib eye steak, sausage, ham, and bacon. ? High-fat dairy items, such as whole milk, butter, and cream cheese.  Eat small meals often. Avoid eating large meals.  Avoid drinking large amounts of liquid with your meals.  Avoid eating meals during the 2-3 hours before bedtime.  Avoid lying down right after you eat.  Do not exercise right after you eat. General instructions  Pay attention to any changes in your symptoms.  Take over-the-counter and prescription medicines only as told by your doctor. Do not take aspirin, ibuprofen, or other NSAIDs unless your doctor says it is okay.  Do not use any tobacco products, including cigarettes, chewing tobacco, and e-cigarettes. If you need help quitting,  ask your doctor.  Wear loose clothes. Do not wear anything tight around your waist.  Raise (elevate) the head of your bed about 6 inches (15 cm).  Try to lower your stress. If you need help doing this, ask your doctor.  If you are overweight, lose an amount of weight that is healthy for you. Ask your doctor about a safe weight loss goal.  Keep all follow-up visits as told by your doctor. This is important. Contact a doctor if:  You have new symptoms.  You lose weight and you do not know why it is happening.  You have trouble swallowing, or it hurts to swallow.  You have wheezing or a cough that keeps happening.  Your symptoms do not get better with treatment.  You have a hoarse voice. Get help right away if:  You have pain in your arms, neck, jaw, teeth, or back.  You feel sweaty, dizzy, or light-headed.  You have chest pain or shortness of breath.  You throw up (vomit) and your throw up looks like blood or coffee grounds.  You pass out (faint).  Your poop (stool) is bloody or black.  You cannot swallow, drink, or eat. This information is not intended to replace advice given to you by your health care provider. Make sure you discuss any questions you have with your health care provider. Document Released: 09/08/2007 Document Revised: 08/28/2015 Document Reviewed: 07/17/2014 Elsevier Interactive Patient Education  2018 [employer].   Food Choices for Gastroesophageal Reflux Disease, Adult When you have gastroesophageal reflux disease (GERD), the foods  you eat and your eating habits are very important. Choosing the right foods can help ease your discomfort. What guidelines do I need to follow?  Choose fruits, vegetables, whole grains, and low-fat dairy products.  Choose low-fat meat, fish, and poultry.  Limit fats such as oils, salad dressings, butter, nuts, and avocado.  Keep a food diary. This helps you identify foods that cause symptoms.  Avoid foods that  cause symptoms. These may be different for everyone.  Eat small meals often instead of 3 large meals a day.  Eat your meals slowly, in a place where you are relaxed.  Limit fried foods.  Cook foods using methods other than frying.  Avoid drinking alcohol.  Avoid drinking large amounts of liquids with your meals.  Avoid bending over or lying down until 2-3 hours after eating. What foods are not recommended? These are some foods and drinks that may make your symptoms worse: Vegetables Tomatoes. Tomato juice. Tomato and spaghetti sauce. Chili peppers. Onion and garlic. Horseradish. Fruits Oranges, grapefruit, and lemon (fruit and juice). Meats High-fat meats, fish, and poultry. This includes hot dogs, ribs, ham, sausage, salami, and bacon. Dairy Whole milk and chocolate milk. Sour cream. Cream. Butter. Ice cream. Cream cheese. Drinks Coffee and tea. Bubbly (carbonated) drinks or energy drinks. Condiments Hot sauce. Barbecue sauce. Sweets/Desserts Chocolate and cocoa. Donuts. Peppermint and spearmint. Fats and Oils High-fat foods. This includes [place] fries and potato chips. Other Vinegar. Strong spices. This includes black pepper, white pepper, red pepper, cayenne, curry powder, cloves, ginger, and chili powder. The items listed above may not be a complete list of foods and drinks to avoid. Contact your dietitian for more information. This information is not intended to replace advice given to you by your health care provider. Make sure you discuss any questions you have with your health care provider. Document Released: 09/21/2011 Document Revised: 08/28/2015 Document Reviewed: 01/24/2013 Elsevier Interactive Patient Education  2017 [employer].

## 2017-04-21 NOTE — Assessment & Plan Note (Signed)
Abdominal exam benign today and history with sour taste and midepigastric pain is most consistent with acid reflux. No diarrhea or nausea/vomiting or fever/chills to suggest intraabdominal infection and patient is well appearing on exam. Stools are normal and regular BMs so unlikely constipation. Start famotidine 20mg  qd and patient instructed can titrate up to BID to see if he has any symptomatic relief. Patient also given education on which foods to avoid to help manage his symptoms.

## 2017-05-04 ENCOUNTER — Ambulatory Visit: Payer: Medicare Other

## 2017-05-17 ENCOUNTER — Encounter: Payer: Self-pay | Admitting: Family Medicine

## 2017-05-18 ENCOUNTER — Emergency Department (HOSPITAL_COMMUNITY)
Admission: EM | Admit: 2017-05-18 | Discharge: 2017-05-18 | Disposition: A | Discharge status: Home / Self Care | Payer: Medicare Other | Attending: Emergency Medicine | Admitting: Emergency Medicine

## 2017-05-18 ENCOUNTER — Encounter (HOSPITAL_COMMUNITY): Payer: Self-pay | Admitting: *Deleted

## 2017-05-18 ENCOUNTER — Other Ambulatory Visit: Payer: Self-pay

## 2017-05-18 ENCOUNTER — Emergency Department (HOSPITAL_COMMUNITY): Payer: Medicare Other

## 2017-05-18 DIAGNOSIS — Z79899 Other long term (current) drug therapy: Secondary | ICD-10-CM | POA: Insufficient documentation

## 2017-05-18 DIAGNOSIS — R05 Cough: Secondary | ICD-10-CM | POA: Diagnosis not present

## 2017-05-18 DIAGNOSIS — R509 Fever, unspecified: Secondary | ICD-10-CM | POA: Diagnosis not present

## 2017-05-18 DIAGNOSIS — J111 Influenza due to unidentified influenza virus with other respiratory manifestations: Secondary | ICD-10-CM | POA: Insufficient documentation

## 2017-05-18 DIAGNOSIS — I5022 Chronic systolic (congestive) heart failure: Secondary | ICD-10-CM | POA: Insufficient documentation

## 2017-05-18 DIAGNOSIS — R0981 Nasal congestion: Secondary | ICD-10-CM | POA: Diagnosis not present

## 2017-05-18 DIAGNOSIS — I110 Hypertensive heart disease with heart failure: Secondary | ICD-10-CM | POA: Diagnosis not present

## 2017-05-18 DIAGNOSIS — J029 Acute pharyngitis, unspecified: Secondary | ICD-10-CM | POA: Diagnosis present

## 2017-05-18 LAB — RAPID STREP SCREEN (MED CTR MEBANE ONLY): STREPTOCOCCUS, GROUP A SCREEN (DIRECT): NEGATIVE

## 2017-05-18 MED ORDER — ACETAMINOPHEN 325 MG PO TABS
650.0000 mg | ORAL_TABLET | Freq: Once | ORAL | Status: AC | PRN
  Administered 2017-05-18: 650 mg via ORAL
  Filled 2017-05-18: qty 2
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
## 2017-05-18 NOTE — Discharge Instructions (Signed)
It was our pleasure to provide your ER care today - we hope that you feel better.  Rest. Drink adequate fluids.  Take acetaminophen and/or ibuprofen as need for fever.  May take mucinex or other cold/flu medication as need for relief of cough and other symptoms.   Follow up with primary care doctor in 1 week if symptoms fail to improve/resolve.  Return to ER if worse, new symptoms, increased trouble breathing, other concern.
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
## 2017-05-18 NOTE — ED Notes (Signed)
Pt denies sore throat and states cough with yellow sputum and blood tinged.  He was concerned since he found out he has chf.  Pt also reports upper abdominal cramping

## 2017-05-18 NOTE — ED Triage Notes (Signed)
Pt arrived by [facility] from home. Reports that he started feeling bad on Friday. Has chills, sore throat, productive cough. Denies headache or bodyaches. Mask on pt at triage.

## 2017-05-20 LAB — CULTURE, GROUP A STREP (THRC)

## 2017-05-23 ENCOUNTER — Encounter (HOSPITAL_COMMUNITY): Payer: Self-pay

## 2017-05-23 ENCOUNTER — Ambulatory Visit (HOSPITAL_COMMUNITY)
Admission: RE | Admit: 2017-05-23 | Discharge: 2017-05-23 | Disposition: A | Discharge status: Home / Self Care | Payer: Medicare Other | Source: Ambulatory Visit | Attending: Internal Medicine | Admitting: Internal Medicine

## 2017-05-23 VITALS — BP 126/88 | HR 94 | Ht 71.0 in | Wt 268.0 lb

## 2017-05-23 DIAGNOSIS — F329 Major depressive disorder, single episode, unspecified: Secondary | ICD-10-CM | POA: Diagnosis not present

## 2017-05-23 DIAGNOSIS — I428 Other cardiomyopathies: Secondary | ICD-10-CM | POA: Diagnosis not present

## 2017-05-23 DIAGNOSIS — I513 Intracardiac thrombosis, not elsewhere classified: Secondary | ICD-10-CM

## 2017-05-23 DIAGNOSIS — I240 Acute coronary thrombosis not resulting in myocardial infarction: Secondary | ICD-10-CM

## 2017-05-23 DIAGNOSIS — I5022 Chronic systolic (congestive) heart failure: Secondary | ICD-10-CM

## 2017-05-23 DIAGNOSIS — J069 Acute upper respiratory infection, unspecified: Secondary | ICD-10-CM | POA: Insufficient documentation

## 2017-05-23 DIAGNOSIS — Z7901 Long term (current) use of anticoagulants: Secondary | ICD-10-CM | POA: Insufficient documentation

## 2017-05-23 DIAGNOSIS — Z79899 Other long term (current) drug therapy: Secondary | ICD-10-CM | POA: Diagnosis not present

## 2017-05-23 DIAGNOSIS — I110 Hypertensive heart disease with heart failure: Secondary | ICD-10-CM | POA: Diagnosis not present

## 2017-05-23 DIAGNOSIS — I429 Cardiomyopathy, unspecified: Secondary | ICD-10-CM | POA: Insufficient documentation

## 2017-05-23 LAB — BASIC METABOLIC PANEL
ANION GAP: 10 (ref 5–15)
BUN: 18 mg/dL (ref 6–20)
CO2: 21 mmol/L — ABNORMAL LOW (ref 22–32)
Calcium: 8.5 mg/dL — ABNORMAL LOW (ref 8.9–10.3)
Chloride: 105 mmol/L (ref 101–111)
Creatinine, Ser: 1.43 mg/dL — ABNORMAL HIGH (ref 0.61–1.24)
GFR calc Af Amer: >60 mL/min (ref >60)
GFR calc non Af Amer: >60 mL/min (ref >60)
Glucose, Bld: 129 mg/dL — ABNORMAL HIGH (ref 65–99)
POTASSIUM: 3.9 mmol/L (ref 3.5–5.1)
SODIUM: 136 mmol/L (ref 135–145)

## 2017-05-23 LAB — DIGOXIN LEVEL

## 2017-05-23 NOTE — Progress Notes (Signed)
Advanced Heart Failure Clinic Note   PCP: Dr. Leonides Schanz Cardiology: Dr. [name]  37 yo with history of HTN and nonischemic cardiomyopathy presents for CHF clinic followup.  He was admitted in 12/17 after 1 month of worsening dyspnea.  He was volume overloaded on exam with EF 10-15% on echo.  He was diuresed and RHC/LHC done.  This showed no CAD. Cardiac output was low.   Cardiac MRI in 4/18 showed severe LV dilation, EF 24%, prominent trabeculation (possible noncompaction), RV insertion LGE (nonspecific).    He presents today for post ER follow up. Seen in ED last week for flu-like symptoms. Sent home with symptomatic treatment as was already outside 48 hr window for tamiflu. Not swabbed. He feels better, though still has dry cough. Denies fever or chills. Trying to watch salt and fluid. Wants to eat more greens but worried about coumadin dosing. Denies lightheadedness or dizziness.   Labs (1/18): K 4.3, creatinine 1.47, BNP 243, TSH normal, HIV negative, SPEP negative Labs (4/18): BNP 390, hgb 14.4, K 4.3, creatinine 1.33 Labs (7/18): K 4.3, creatinine 1.4  PMH: 1. HTN 2. Depression 3. Chronic systolic CHF: Nonischemic cardiomyopathy, diagnosed in 12/17.  - Echo (12/17) with EF 10-15%, LV thrombus, moderate MR, PASP 47 mmHg, moderately dilated and dysfunctional RV.  - Echo (7/18) with EF 15%, moderate MR. PASP 40 mm Hg.  - LHC/RHC (12/17): No CAD; mean RA 8, PA 41/22, mean PCWP 20, CI 1.7.  - HIV, SPEP, TSH negative.  - Cardiac MRI in 4/18 showed severe LV dilation, EF 24%, prominent trabeculation (possible noncompaction), RV insertion LGE (nonspecific), no LV thrombus.   - Echo (7/18): EF 15%, severe LV dilation with diffuse HK, grade 3 diastolic dysfunction, mild-moderate MR, PASP 40 mmHg.  4. LV thrombus: On warfarin.   SH: On disability, nonsmoker, no ETOH/drugs.  FH: Mother with CHF but developed later in life (around 29).  No sudden cardiac death.   Review of systems complete and  found to be negative unless listed in HPI.    Current Outpatient Medications  Medication Sig Dispense Refill  . BIDIL 20-37.5 MG tablet TAKE 1 TABLET 3 TIMES A DAY 90 tablet 3  . carvedilol (COREG) 3.125 MG tablet Take 1 tablet (3.125 mg total) by mouth 2 (two) times daily. 60 tablet 3  . digoxin (LANOXIN) 0.125 MG tablet Take 1 tablet (125 mcg total) daily by mouth. 90 tablet 3  . furosemide (LASIX) 40 MG tablet Take 1.5 tablets (60 mg total) by mouth 2 (two) times daily. 90 tablet 3  . KLOR-CON M20 20 MEQ tablet Take 1 tablet (20 mEq total) daily by mouth. 90 tablet 3  . losartan (COZAAR) 25 MG tablet Take 1 tablet (25 mg total) at bedtime by mouth. 90 tablet 3  . spironolactone (ALDACTONE) 25 MG tablet TAKE 1 TABLET BY MOUTH EVERY DAY 30 tablet 3  . warfarin (COUMADIN) 5 MG tablet TAKE 2 TABLETS BY MOUTH EVERY DAY AND 1 TABLET ON TUESDAYS 60 tablet 0  . famotidine (PEPCID) 20 MG tablet Take 1 tablet (20 mg total) by mouth 2 (two) times daily. 60 tablet 2  . guaiFENesin-dextromethorphan (ROBITUSSIN DM) 100-10 MG/5ML syrup Take 5 mLs by mouth every 4 (four) hours as needed for cough. (Patient not taking: Reported on 06/08/2016) 118 mL 0  . ipratropium (ATROVENT) 0.06 % nasal spray Place 2 sprays into both nostrils 4 (four) times daily. (Patient not taking: Reported on 07/21/2016) 15 mL 12   No current facility-administered  medications for this encounter.    Vitals:   05/23/17 1520  BP: 126/88  Pulse: 94  SpO2: 98%  Weight: 268 lb (121.6 kg)  Height: 5\' 11"  (1.803 m)   Wt Readings from Last 3 Encounters:  05/23/17 268 lb (121.6 kg)  04/20/17 266 lb (120.7 kg)  01/04/17 255 lb (115.7 kg)    Physical Exam General: Well appearing. No resp difficulty. HEENT: Normal Neck: Supple. JVP 5-6. Carotids 2+ bilat; no bruits. No thyromegaly or nodule noted. Cor: PMI nondisplaced. RRR, No M/G/R noted Lungs: CTAB, normal effort. Abdomen: Soft, non-tender, non-distended, no HSM. No bruits or  masses. +BS  Extremities: No cyanosis, clubbing, or rash. R and LLE no edema.  Neuro: Alert & orientedx3, cranial nerves grossly intact. moves all 4 extremities w/o difficulty. Affect pleasant   Assessment/Plan: 1. Chronic systolic CHF: Nonischemic cardiomyopathy.  EF 10-15% by echo in 12/17.  No coronary disease and low output on cath.  HIV negative, SPEP negative.  No family history of early cardiomyopathy (mother with CHF around 60).  No ETOH/drugs. Possible viral myocarditis.  Cardiac MRI in 4/18 with severe LV dilation, EF 24%, RV insertion site LGE (nonspecific), cannot rule out noncompaction.  - Echo 10/2016 LVEF 15%. Needs to see EP for ICD.  - NYHA II symptoms.  - Volume status stable on exam.  - Continue lasix 60 mg BID.  - Continue KCl 20 mEq daily.  - Continue digoxin 0.125 mg daily. Check level today.  - Continue losartan 25 mg daily. Historically BP has been too low for Entresto.  - Continue Bidil 1 tab TID - Continue spiro 25 mg daily - Continue Coreg 3.125 mg BID - Reinforced fluid restriction to < 2 L daily, sodium restriction to less than 2000 mg daily, and the importance of daily weights.      2. LV thrombus: Not seen on cMRI in 4/18.   - Continue warfarin for anticoagulation. No change.  3. Depression: - Stable. Per PCP.   4. URI - Seen in ED last week. Feeling better.  Refer to EP for ICD consideration. No med adjustment today with on-going illness. Labs today. RTC 6-8 weeks with Echo.   Graciella Freer, PA-C  05/23/2017   Greater than 50% of the 25 minute visit was spent in counseling/coordination of care regarding disease state education, salt/fluid restriction, sliding scale diuretics, and medication compliance.

## 2017-05-23 NOTE — Patient Instructions (Addendum)
Routine lab work today. Will notify you of abnormal results, otherwise no news is good news!  No changes to medication at this time.  Follow up 2 months with Dr. [name] and echocardiogram.  ______________________________________________________ [place] Code: 1100  Take all medication as prescribed the day of your appointment. Bring all medications with you to your appointment.  Do the following things EVERYDAY: 1) Weigh yourself in the morning before breakfast. Write it down and keep it in a log. 2) Take your medicines as prescribed 3) Eat low salt foods-Limit salt (sodium) to 2000 mg per day.  4) Stay as active as you can everyday 5) Limit all fluids for the day to less than 2 liters

## 2017-05-23 NOTE — Progress Notes (Signed)
Cancelling order as lab not collected, no need to re-order 

## 2017-06-08 ENCOUNTER — Encounter (HOSPITAL_COMMUNITY): Payer: Self-pay | Admitting: *Deleted

## 2017-07-05 ENCOUNTER — Ambulatory Visit (INDEPENDENT_AMBULATORY_CARE_PROVIDER_SITE_OTHER): Payer: Medicare Other | Admitting: *Deleted

## 2017-07-05 ENCOUNTER — Ambulatory Visit (HOSPITAL_COMMUNITY): Admission: RE | Admit: 2017-07-05 | Payer: Medicare Other | Source: Ambulatory Visit

## 2017-07-05 ENCOUNTER — Other Ambulatory Visit: Payer: Self-pay | Admitting: *Deleted

## 2017-07-05 ENCOUNTER — Encounter (HOSPITAL_COMMUNITY): Payer: Medicare Other | Admitting: Cardiology

## 2017-07-05 DIAGNOSIS — Z7901 Long term (current) use of anticoagulants: Secondary | ICD-10-CM | POA: Diagnosis present

## 2017-07-05 LAB — POCT INR: INR: 1.6

## 2017-07-06 MED ORDER — WARFARIN SODIUM 5 MG PO TABS
ORAL_TABLET | ORAL | 0 refills | Status: DC
Start: 2017-07-06 — End: 2017-07-28

## 2017-07-13 ENCOUNTER — Other Ambulatory Visit: Payer: Self-pay | Admitting: Family Medicine

## 2017-07-13 DIAGNOSIS — K219 Gastro-esophageal reflux disease without esophagitis: Secondary | ICD-10-CM

## 2017-07-28 ENCOUNTER — Other Ambulatory Visit: Payer: Self-pay | Admitting: Family Medicine

## 2017-08-02 ENCOUNTER — Ambulatory Visit (INDEPENDENT_AMBULATORY_CARE_PROVIDER_SITE_OTHER): Payer: Medicare Other | Admitting: *Deleted

## 2017-08-02 DIAGNOSIS — I513 Intracardiac thrombosis, not elsewhere classified: Secondary | ICD-10-CM

## 2017-08-02 DIAGNOSIS — Z7901 Long term (current) use of anticoagulants: Secondary | ICD-10-CM | POA: Diagnosis present

## 2017-08-02 LAB — POCT INR: INR: 2.4

## 2017-08-08 ENCOUNTER — Other Ambulatory Visit (HOSPITAL_COMMUNITY): Payer: Self-pay | Admitting: *Deleted

## 2017-08-08 MED ORDER — HYDRALAZINE HCL 25 MG PO TABS: 25.0000 mg | ORAL_TABLET | Freq: Three times a day (TID) | ORAL | 3 refills | Status: DC

## 2017-08-08 MED ORDER — ISOSORBIDE MONONITRATE ER 30 MG PO TB24: 30.0000 mg | ORAL_TABLET | Freq: Every day | ORAL | 3 refills | Status: DC

## 2017-08-16 ENCOUNTER — Other Ambulatory Visit: Payer: Self-pay | Admitting: Family Medicine

## 2017-08-17 MED ORDER — WARFARIN SODIUM 5 MG PO TABS: ORAL_TABLET | ORAL | 1 refills | Status: DC

## 2017-08-17 NOTE — Addendum Note (Signed)
Addended by: Henri Medal on: 08/17/2017 08:32 AM   Modules accepted: Orders

## 2017-08-17 NOTE — Telephone Encounter (Signed)
Transmission to pharmacy failed and I resent it electronically before calling them. Jazmin Hartsell,CMA

## 2017-08-25 ENCOUNTER — Other Ambulatory Visit: Payer: Self-pay

## 2017-08-25 ENCOUNTER — Ambulatory Visit (HOSPITAL_COMMUNITY)
Admission: RE | Admit: 2017-08-25 | Discharge: 2017-08-25 | Disposition: A | Discharge status: Home / Self Care | Payer: Medicare Other | Source: Ambulatory Visit | Attending: Student | Admitting: Student

## 2017-08-25 ENCOUNTER — Encounter: Payer: Self-pay | Admitting: *Deleted

## 2017-08-25 ENCOUNTER — Ambulatory Visit (HOSPITAL_BASED_OUTPATIENT_CLINIC_OR_DEPARTMENT_OTHER)
Admission: RE | Admit: 2017-08-25 | Discharge: 2017-08-25 | Disposition: A | Discharge status: Home / Self Care | Payer: Medicare Other | Source: Ambulatory Visit | Attending: Cardiology | Admitting: Cardiology

## 2017-08-25 ENCOUNTER — Encounter (HOSPITAL_COMMUNITY): Payer: Self-pay | Admitting: Cardiology

## 2017-08-25 VITALS — BP 124/79 | HR 86 | Wt 259.5 lb

## 2017-08-25 DIAGNOSIS — I428 Other cardiomyopathies: Secondary | ICD-10-CM | POA: Diagnosis not present

## 2017-08-25 DIAGNOSIS — I110 Hypertensive heart disease with heart failure: Secondary | ICD-10-CM | POA: Insufficient documentation

## 2017-08-25 DIAGNOSIS — Z7901 Long term (current) use of anticoagulants: Secondary | ICD-10-CM | POA: Diagnosis not present

## 2017-08-25 DIAGNOSIS — I447 Left bundle-branch block, unspecified: Secondary | ICD-10-CM | POA: Diagnosis not present

## 2017-08-25 DIAGNOSIS — I77810 Thoracic aortic ectasia: Secondary | ICD-10-CM | POA: Insufficient documentation

## 2017-08-25 DIAGNOSIS — Z86718 Personal history of other venous thrombosis and embolism: Secondary | ICD-10-CM | POA: Insufficient documentation

## 2017-08-25 DIAGNOSIS — Z79899 Other long term (current) drug therapy: Secondary | ICD-10-CM | POA: Insufficient documentation

## 2017-08-25 DIAGNOSIS — I5022 Chronic systolic (congestive) heart failure: Secondary | ICD-10-CM

## 2017-08-25 DIAGNOSIS — I513 Intracardiac thrombosis, not elsewhere classified: Secondary | ICD-10-CM | POA: Diagnosis not present

## 2017-08-25 DIAGNOSIS — F329 Major depressive disorder, single episode, unspecified: Secondary | ICD-10-CM | POA: Diagnosis not present

## 2017-08-25 DIAGNOSIS — I240 Acute coronary thrombosis not resulting in myocardial infarction: Secondary | ICD-10-CM

## 2017-08-25 DIAGNOSIS — Z006 Encounter for examination for normal comparison and control in clinical research program: Secondary | ICD-10-CM

## 2017-08-25 DIAGNOSIS — I340 Nonrheumatic mitral (valve) insufficiency: Secondary | ICD-10-CM | POA: Insufficient documentation

## 2017-08-25 LAB — CBC
HCT: 46.7 % (ref 39.0–52.0)
HEMOGLOBIN: 15.7 g/dL (ref 13.0–17.0)
MCH: 27.5 pg (ref 26.0–34.0)
MCHC: 33.6 g/dL (ref 30.0–36.0)
MCV: 81.8 fL (ref 78.0–100.0)
Platelets: 311 10*3/uL (ref 150–400)
RBC: 5.71 MIL/uL (ref 4.22–5.81)
RDW: 14.2 % (ref 11.5–15.5)
WBC: 3.1 10*3/uL — ABNORMAL LOW (ref 4.0–10.5)

## 2017-08-25 LAB — BASIC METABOLIC PANEL
Anion gap: 7 (ref 5–15)
BUN: 11 mg/dL (ref 6–20)
CALCIUM: 9.2 mg/dL (ref 8.9–10.3)
CHLORIDE: 103 mmol/L (ref 101–111)
CO2: 28 mmol/L (ref 22–32)
CREATININE: 1.21 mg/dL (ref 0.61–1.24)
GFR calc Af Amer: >60 mL/min (ref >60)
GFR calc non Af Amer: >60 mL/min (ref >60)
Glucose, Bld: 122 mg/dL — ABNORMAL HIGH (ref 65–99)
Potassium: 4 mmol/L (ref 3.5–5.1)
Sodium: 138 mmol/L (ref 135–145)

## 2017-08-25 LAB — DIGOXIN LEVEL: Digoxin Level: 0.5 ng/mL — ABNORMAL LOW (ref 0.8–2.0)

## 2017-08-25 MED ORDER — SACUBITRIL-VALSARTAN 24-26 MG PO TABS: 1.0000 | ORAL_TABLET | Freq: Two times a day (BID) | ORAL | 3 refills | Status: DC

## 2017-08-25 NOTE — Progress Notes (Signed)
  Echocardiogram 2D Echocardiogram has been performed.  Janalyn Harder 08/25/2017, 9:55 AM

## 2017-08-25 NOTE — Patient Instructions (Signed)
Stop Losartan  Start Entresto 24/26 mg (1 tab), twice a day  You have been referred to EP for ICD placement  Your physician has recommended that you have a cardiopulmonary stress test (CPX). CPX testing is a non-invasive measurement of heart and lung function. It replaces a traditional treadmill stress test. This type of test provides a tremendous amount of information that relates not only to your present condition but also for future outcomes. This test combines measurements of you ventilation, respiratory gas exchange in the lungs, electrocardiogram (EKG), blood pressure and physical response before, during, and following an exercise protocol.  Labs drawn today (if we do not call you, then your lab work was stable)   Your physician recommends that you return for lab work in: 10 days   Your physician recommends that you schedule a follow-up appointment in: Pharmacy in 3 weeks   Your physician recommends that you schedule a follow-up appointment in: 6 weeks with Dr. [name]

## 2017-08-25 NOTE — Progress Notes (Signed)
Advanced Heart Failure Clinic Note   PCP: Dr. Leonides Schanz Cardiology: Dr. [name]  37 yo with history of HTN and nonischemic cardiomyopathy presents for CHF clinic followup.  He was admitted in 12/17 after 1 month of worsening dyspnea.  He was volume overloaded on exam with EF 10-15% on echo.  He was diuresed and RHC/LHC done.  This showed no CAD. Cardiac output was low.  Cardiac MRI in 4/18 showed severe LV dilation, EF 24%, prominent trabeculation (possible noncompaction), RV insertion LGE (nonspecific).    He returns today for followup of CHF. Echo was done today and reviewed, the LV is severely dilated with EF 25-30%.  No dyspnea walking on flat ground.  He can climb a flight of steps without problems.  No orthopnea/PND.  Weight is down 9 lbs.    Labs (1/18): K 4.3, creatinine 1.47, BNP 243, TSH normal, HIV negative, SPEP negative Labs (4/18): BNP 390, hgb 14.4, K 4.3, creatinine 1.33 Labs (7/18): K 4.3, creatinine 1.4 Labs (2/19): K 3.9, creatinine 1.43, digoxin < 0.2  ECG (personally reviewed): NSR, iLBBB (112 msec), lateral TWIs  PMH: 1. HTN 2. Depression 3. Chronic systolic CHF: Nonischemic cardiomyopathy, diagnosed in 12/17.  - Echo (12/17) with EF 10-15%, LV thrombus, moderate MR, PASP 47 mmHg, moderately dilated and dysfunctional RV.  - Echo (7/18) with EF 15%, moderate MR. PASP 40 mm Hg.  - LHC/RHC (12/17): No CAD; mean RA 8, PA 41/22, mean PCWP 20, CI 1.7.  - HIV, SPEP, TSH negative.  - Cardiac MRI in 4/18 showed severe LV dilation, EF 24%, prominent trabeculation (possible noncompaction), RV insertion LGE (nonspecific), no LV thrombus.   - Echo (7/18): EF 15%, severe LV dilation with diffuse HK, grade 3 diastolic dysfunction, mild-moderate MR, PASP 40 mmHg.  - Echo (5/19): EF 25-30%, severe LV dilation with diffuse hypokinesis, mild MR, mild RV dilation with normal systolic function.  4. LV thrombus: On warfarin.   SH: On disability, nonsmoker, no ETOH/drugs.  FH: Mother  with CHF but developed later in life (around 66).  No sudden cardiac death.   Review of systems complete and found to be negative unless listed in HPI.    Current Outpatient Medications  Medication Sig Dispense Refill  . carvedilol (COREG) 3.125 MG tablet Take 1 tablet (3.125 mg total) by mouth 2 (two) times daily. 60 tablet 3  . digoxin (LANOXIN) 0.125 MG tablet Take 1 tablet (125 mcg total) daily by mouth. 90 tablet 3  . famotidine (PEPCID) 20 MG tablet TAKE 1 TABLET BY MOUTH TWICE A DAY 60 tablet 2  . furosemide (LASIX) 40 MG tablet Take 1.5 tablets (60 mg total) by mouth 2 (two) times daily. 90 tablet 3  . guaiFENesin-dextromethorphan (ROBITUSSIN DM) 100-10 MG/5ML syrup Take 5 mLs by mouth every 4 (four) hours as needed for cough. 118 mL 0  . hydrALAZINE (APRESOLINE) 25 MG tablet Take 1 tablet (25 mg total) by mouth 3 (three) times daily. 90 tablet 3  . ipratropium (ATROVENT) 0.06 % nasal spray Place 2 sprays into both nostrils 4 (four) times daily. 15 mL 12  . isosorbide mononitrate (IMDUR) 30 MG 24 hr tablet Take 1 tablet (30 mg total) by mouth daily. 30 tablet 3  . KLOR-CON M20 20 MEQ tablet Take 1 tablet (20 mEq total) daily by mouth. 90 tablet 3  . spironolactone (ALDACTONE) 25 MG tablet TAKE 1 TABLET BY MOUTH EVERY DAY 30 tablet 3  . warfarin (COUMADIN) 5 MG tablet TAKE AS DIRECTED BY COUMADIN  CLINIC 180 tablet 1  . sacubitril-valsartan (ENTRESTO) 24-26 MG Take 1 tablet by mouth 2 (two) times daily. 60 tablet 3   No current facility-administered medications for this encounter.    BP 124/79   Pulse 86   Wt 259 lb 8 oz (117.7 kg)   SpO2 100%   BMI 36.19 kg/m    Wt Readings from Last 3 Encounters:  08/25/17 259 lb 8 oz (117.7 kg)  05/23/17 268 lb (121.6 kg)  04/20/17 266 lb (120.7 kg)    Physical Exam General: NAD Neck: No JVD, no thyromegaly or thyroid nodule.  Lungs: Clear to auscultation bilaterally with normal respiratory effort. CV: Nondisplaced PMI.  Heart regular  S1/S2, no S3/S4, no murmur.  No peripheral edema.  No carotid bruit.  Normal pedal pulses.  Abdomen: Soft, nontender, no hepatosplenomegaly, no distention.  Skin: Intact without lesions or rashes.  Neurologic: Alert and oriented x 3.  Psych: Normal affect. Extremities: No clubbing or cyanosis.  HEENT: Normal.   Assessment/Plan: 1. Chronic systolic CHF: Nonischemic cardiomyopathy.  EF 10-15% by echo in 12/17.  No coronary disease and low output on cath.  HIV negative, SPEP negative.  No family history of early cardiomyopathy (mother with CHF around 64).  No ETOH/drugs. Possible viral myocarditis.  Cardiac MRI in 4/18 with severe LV dilation, EF 24%, RV insertion site LGE (nonspecific), cannot rule out noncompaction.  Echo was done today and reviewed, showing EF 25-30% with severe LV dilation. NYHA class II symptoms.  He is not volume overloaded on exam.  - Continue Lasix 60 mg bid, BMET today.  - Continue digoxin 0.125 and check level today.  - Stop losartan, start Entresto 24/26 bid with BMET in 10 days.  - Continue current hydralazine/Imdur, Coreg, and spironolactone.  - I will arrange for CPX.  - With persistently low EF and young age, I recommended ICD despite nonischemic etiology.  I will make him an appointment to talk with EP. He is not a CRT candidate with relatively narrow QRS.  2. LV thrombus: Not seen on cMRI in 4/18 or echo today.   - Continue warfarin for anticoagulation   Followup with CHF pharmacist in 3 weeks for medication titration and see me in 6 wks.   Marca Ancona, MD  08/25/2017

## 2017-08-25 NOTE — Progress Notes (Signed)
[facility] Informed Consent           Subject Name:  Kenneth Robertson    Subject met inclusion and exclusion criteria.  The informed consent form, study requirements and expectations were reviewed with the subject and questions and concerns were addressed prior to the signing of the consent form.  The subject verbalized understanding of the trial requirements.  The subject agreed to participate in the [facility] trial and signed the informed consent.  The informed consent was obtained prior to performance of any protocol-specific procedures for the subject.  A copy of the signed informed consent was given to the subject and a copy was placed in the subject's medical record.   [place] Chalmers, Research Assistant 08/25/2017 10:39 a.m.

## 2017-09-05 ENCOUNTER — Encounter (HOSPITAL_COMMUNITY): Payer: Medicare Other

## 2017-09-05 ENCOUNTER — Other Ambulatory Visit (HOSPITAL_COMMUNITY): Payer: Medicare Other

## 2017-09-09 ENCOUNTER — Institutional Professional Consult (permissible substitution): Payer: Medicare Other | Admitting: Internal Medicine

## 2017-09-09 ENCOUNTER — Encounter: Payer: Self-pay | Admitting: Internal Medicine

## 2017-09-13 NOTE — Progress Notes (Signed)
PCP: Dr. Leonides Schanz Cardiology: Dr. [name]  HPI:   37 yo with history of HTN and nonischemic cardiomyopathy presents for CHF clinic followup.  He was admitted in 12/17 after 1 month of worsening dyspnea.  He was volume overloaded on exam with EF 10-15% on echo.  He was diuresed and RHC/LHC done.  This showed no CAD. Cardiac output was low.  Cardiac MRI in 4/18 showed severe LV dilation, EF 24%, prominent trabeculation (possible noncompaction), RV insertion LGE (nonspecific).     Echo was done and reviewed by MD, the LV is severely dilated with EF 25-30%.  No dyspnea walking on flat ground.  He can climb a flight of steps without problems.  No orthopnea/PND.  Weight was down 9 lbs.    Labs (1/18): K 4.3, creatinine 1.47, BNP 243, TSH normal, HIV negative, SPEP negative Labs (4/18): BNP 390, hgb 14.4, K 4.3, creatinine 1.33 Labs (7/18): K 4.3, creatinine 1.4 Labs (2/19): K 3.9, creatinine 1.43, digoxin < 0.2  Today he presents for his initial pharmacist medication titration clinic visit.  At last MD visit losartan was stopped and Entresto 24-26 was started  - however he was concerned with Side Effects and did not start Entresto He has not been taking spironolactone  or coreg for the last year - confirmed with [facility] that he has not ever filled these medications. Last labs 08/25/17 stable Cr and K He walks 3-6 miles daily - slow but able to walk without difficulty.     . Shortness of breath/dyspnea on exertion? no  . Orthopnea/PND? no . Edema? no . Lightheadedness/dizziness? no . Daily weights at home? yes . Blood pressure/heart rate monitoring at home? no . Following low-sodium/fluid-restricted diet? Yes - most of the time had pizza and fried chicken last week  HF Medications: Digoxin 0.125mg  Daily Furosemide 60mg  BID Hydralazine 25mg  TID  Imdur 30mg  Daily Potassium Daily Losartan 25mg  Daily   Has the patient been experiencing any side effects to the medications prescribed?   no  Does the patient have any problems obtaining medications due to transportation or finances?   no  Understanding of regimen: poor Understanding of indications: poor Potential of compliance: poor Patient understands to avoid NSAIDs. Patient understands to avoid decongestants.    Pertinent Lab Values: 08/25/17 . Serum creatinine 1.21 stable  CO2 28  Potassium 4 stable, Sodium 138  Digoxin 0.5  Vital Signs: . Weight: 260lb  (dry weight: 260lb) . Blood pressure: 124/90 . Heart rate: 88 . O2 98%   Assessment: 1. Chronicsystolic CHF (EF 95%), due to NICM. NYHA class IIsymptoms. -volume status stable  -patient has not been taking medications as prescribed.  Will not increase current medications but will start medications he was supposed to be taking Carvedilol 3.125mg  BID Spironolactone 12.5mg  Daily -Continue current HF medicaitons Digoxin 0.125mg  Daily Furosemide 60mg  BID Hydralazine 25mg  TID  Imdur 30mg  Daily Potassium Daily Losartan 25mg  Daily - Basic disease state pathophysiology, medication indication, mechanism and side effects reviewed at length with patient and he verbalized understanding  2)  LV thrombus: Not seen on cMRI in 4/18 or most recent echo  - per MD - Continue warfarin for anticoagulation     Plan: 1) Medication changes: Based on clinical presentation, vital signs stable and recent labs stable patient has not been taking medications as prescribed.  Will not increase current medications but will start medications he was supposed to be taking Carvedilol 3.125mg  BID Spironolactone 12.5mg  Daily  With plans to switch to  Entresto at next visit if labs and BP stable -Continue current HF medicaitons Digoxin 0.125mg  Daily Furosemide 60mg  BID Hydralazine 25mg  TID  Imdur 30mg  Daily Potassium Daily Losartan 25mg  Daily 2) Labs: 09/19/17 BMET  3) Follow-up: pharmacy clinic 09/19/17    Leota Sauers Pharm.D. CPP, BCPS Clinical  Pharmacist [phone] 09/14/2017 4:17 PM

## 2017-09-14 ENCOUNTER — Ambulatory Visit (HOSPITAL_COMMUNITY)
Admission: RE | Admit: 2017-09-14 | Discharge: 2017-09-14 | Disposition: A | Discharge status: Home / Self Care | Payer: Medicare Other | Source: Ambulatory Visit | Attending: Internal Medicine | Admitting: Internal Medicine

## 2017-09-14 DIAGNOSIS — Z09 Encounter for follow-up examination after completed treatment for conditions other than malignant neoplasm: Secondary | ICD-10-CM | POA: Insufficient documentation

## 2017-09-14 DIAGNOSIS — I5022 Chronic systolic (congestive) heart failure: Secondary | ICD-10-CM | POA: Diagnosis not present

## 2017-09-14 DIAGNOSIS — Z86718 Personal history of other venous thrombosis and embolism: Secondary | ICD-10-CM | POA: Diagnosis not present

## 2017-09-14 DIAGNOSIS — Z7901 Long term (current) use of anticoagulants: Secondary | ICD-10-CM | POA: Insufficient documentation

## 2017-09-14 DIAGNOSIS — I429 Cardiomyopathy, unspecified: Secondary | ICD-10-CM | POA: Insufficient documentation

## 2017-09-14 DIAGNOSIS — Z79899 Other long term (current) drug therapy: Secondary | ICD-10-CM | POA: Diagnosis not present

## 2017-09-14 MED ORDER — CARVEDILOL 3.125 MG PO TABS: 3.1250 mg | ORAL_TABLET | Freq: Two times a day (BID) | ORAL | 3 refills | Status: DC

## 2017-09-14 MED ORDER — LOSARTAN POTASSIUM 25 MG PO TABS: 25.0000 mg | ORAL_TABLET | Freq: Every day | ORAL | 3 refills | Status: DC

## 2017-09-14 MED ORDER — SPIRONOLACTONE 25 MG PO TABS: 12.5000 mg | ORAL_TABLET | Freq: Every day | ORAL | 3 refills | Status: DC

## 2017-09-14 NOTE — Patient Instructions (Signed)
Start Carvedilol 3.125mg  twice daily Start Spironolactone 12.5mg  (1/2tab) Daily Continue other medications Return for pharmacy visit and labs 6/17 at 10am

## 2017-09-15 ENCOUNTER — Encounter (HOSPITAL_COMMUNITY): Payer: Medicare Other

## 2017-09-15 ENCOUNTER — Inpatient Hospital Stay (HOSPITAL_COMMUNITY): Admission: RE | Admit: 2017-09-15 | Payer: Medicare Other | Source: Ambulatory Visit

## 2017-09-19 ENCOUNTER — Other Ambulatory Visit (HOSPITAL_COMMUNITY): Payer: Medicare Other

## 2017-09-19 ENCOUNTER — Telehealth (HOSPITAL_COMMUNITY): Payer: Self-pay | Admitting: Cardiology

## 2017-09-19 NOTE — Telephone Encounter (Signed)
Pt called and LVM on clinic phone stating he needed to reschedule his Pharmacy appt b/c he did not start taking his meds but that he has started at this time.  Called patient and LVM to call back to reschedule his appt.

## 2017-10-05 ENCOUNTER — Telehealth (HOSPITAL_COMMUNITY): Payer: Self-pay | Admitting: *Deleted

## 2017-10-05 NOTE — Telephone Encounter (Signed)
Called patient for reminder about CPX appointment on 10/07/17 at 09:00. Patient confirmed the appointment date and time.    Lesia Hausen, MS, ACSM-RCEP Clinical Exercise Physiologist

## 2017-10-07 ENCOUNTER — Other Ambulatory Visit (HOSPITAL_COMMUNITY): Payer: Self-pay | Admitting: *Deleted

## 2017-10-07 ENCOUNTER — Ambulatory Visit (HOSPITAL_COMMUNITY): Payer: Medicare Other | Attending: Internal Medicine

## 2017-10-07 DIAGNOSIS — I5022 Chronic systolic (congestive) heart failure: Secondary | ICD-10-CM

## 2017-10-10 ENCOUNTER — Ambulatory Visit (HOSPITAL_COMMUNITY)
Admission: RE | Admit: 2017-10-10 | Discharge: 2017-10-10 | Disposition: A | Discharge status: Home / Self Care | Payer: Medicare Other | Source: Ambulatory Visit | Attending: Cardiology | Admitting: Cardiology

## 2017-10-10 VITALS — BP 115/87 | HR 73 | Wt 258.0 lb

## 2017-10-10 DIAGNOSIS — Z7901 Long term (current) use of anticoagulants: Secondary | ICD-10-CM | POA: Diagnosis not present

## 2017-10-10 DIAGNOSIS — I429 Cardiomyopathy, unspecified: Secondary | ICD-10-CM | POA: Diagnosis not present

## 2017-10-10 DIAGNOSIS — I110 Hypertensive heart disease with heart failure: Secondary | ICD-10-CM | POA: Insufficient documentation

## 2017-10-10 DIAGNOSIS — F329 Major depressive disorder, single episode, unspecified: Secondary | ICD-10-CM | POA: Diagnosis not present

## 2017-10-10 DIAGNOSIS — I5022 Chronic systolic (congestive) heart failure: Secondary | ICD-10-CM | POA: Diagnosis not present

## 2017-10-10 DIAGNOSIS — Z9114 Patient's other noncompliance with medication regimen: Secondary | ICD-10-CM | POA: Diagnosis not present

## 2017-10-10 DIAGNOSIS — I5189 Other ill-defined heart diseases: Secondary | ICD-10-CM

## 2017-10-10 DIAGNOSIS — F419 Anxiety disorder, unspecified: Secondary | ICD-10-CM | POA: Insufficient documentation

## 2017-10-10 DIAGNOSIS — Z95810 Presence of automatic (implantable) cardiac defibrillator: Secondary | ICD-10-CM | POA: Insufficient documentation

## 2017-10-10 DIAGNOSIS — I513 Intracardiac thrombosis, not elsewhere classified: Secondary | ICD-10-CM

## 2017-10-10 DIAGNOSIS — I240 Acute coronary thrombosis not resulting in myocardial infarction: Secondary | ICD-10-CM

## 2017-10-10 LAB — BASIC METABOLIC PANEL
Anion gap: 10 (ref 5–15)
BUN: 14 mg/dL (ref 6–20)
CO2: 23 mmol/L (ref 22–32)
CREATININE: 1.19 mg/dL (ref 0.61–1.24)
Calcium: 9.3 mg/dL (ref 8.9–10.3)
Chloride: 107 mmol/L (ref 98–111)
Glucose, Bld: 117 mg/dL — ABNORMAL HIGH (ref 70–99)
POTASSIUM: 3.8 mmol/L (ref 3.5–5.1)
SODIUM: 140 mmol/L (ref 135–145)

## 2017-10-10 LAB — DIGOXIN LEVEL: DIGOXIN LVL: 0.3 ng/mL — AB (ref 0.8–2.0)

## 2017-10-10 MED ORDER — SACUBITRIL-VALSARTAN 24-26 MG PO TABS: 1.0000 | ORAL_TABLET | Freq: Two times a day (BID) | ORAL | 3 refills | Status: DC

## 2017-10-10 NOTE — Patient Instructions (Addendum)
Stop Losartan   Start Entresto 24/26 mg (1 tab) twice a day (start 36 hrs after last does of Losartan)  Labs drawn today (if we do not call you, then your lab work was stable)   You have been referred to EP for ICD placement (they will call you)    Your physician recommends that you return for lab work in: 10 days  Your physician recommends that you schedule a follow-up appointment in: 3 weeks with Fara Chute D   Your physician recommends that you schedule a follow-up appointment in: 6 weeks with Dr. [name]

## 2017-10-10 NOTE — Progress Notes (Signed)
Advanced Heart Failure Clinic Note   PCP: Dr. Leonides Schanz Cardiology: Dr. [name]  37 y.o. with history of HTN and nonischemic cardiomyopathy presents for followup of CHF.  He was admitted in 12/17 after 1 month of worsening dyspnea.  He was volume overloaded on exam with EF 10-15% on echo.  He was diuresed and RHC/LHC done.  This showed no CAD. Cardiac output was low.  Cardiac MRI in 4/18 showed severe LV dilation, EF 24%, prominent trabeculation (possible noncompaction), RV insertion LGE (nonspecific).    Last echo was in 5/19.  The LV was severely dilated with EF 25-30%.  CPX done in 7/19 surprisingly did not show a significant CHF limitation.   He is doing well symptomatically.  Dyspnea only with heavy exertion.  Able to do ADLs without problems.  No orthopnea/PND.  No BRBPR/melena on warfarin.  Weight stable.  He admits to significant anxiety about taking medications.  He admits to not taking Coreg or spironolactone for at least a year.  At last appt, he was supposed to transition from losartan to [facility] but did not do this either.  He is taking hydralazine/Imdur, losartan, and digoxin.   Labs (1/18): K 4.3, creatinine 1.47, BNP 243, TSH normal, HIV negative, SPEP negative Labs (4/18): BNP 390, hgb 14.4, K 4.3, creatinine 1.33 Labs (7/18): K 4.3, creatinine 1.4 Labs (2/19): K 3.9, creatinine 1.43, digoxin < 0.2 Labs (5/19): K 4, creatinine 1.21, hgb 15.7, digoxin 0.5  PMH: 1. HTN 2. Depression 3. Chronic systolic CHF: Nonischemic cardiomyopathy, diagnosed in 12/17.  - Echo (12/17) with EF 10-15%, LV thrombus, moderate MR, PASP 47 mmHg, moderately dilated and dysfunctional RV.  - Echo (7/18) with EF 15%, moderate MR. PASP 40 mm Hg.  - LHC/RHC (12/17): No CAD; mean RA 8, PA 41/22, mean PCWP 20, CI 1.7.  - HIV, SPEP, TSH negative.  - Cardiac MRI in 4/18 showed severe LV dilation, EF 24%, prominent trabeculation (possible noncompaction), RV insertion LGE (nonspecific), no LV thrombus.   -  Echo (7/18): EF 15%, severe LV dilation with diffuse HK, grade 3 diastolic dysfunction, mild-moderate MR, PASP 40 mmHg.  - Echo (5/19): EF 25-30%, severe LV dilation with diffuse hypokinesis, mild MR, mild RV dilation with normal systolic function.  - CPX (7/19): RER 1.08, peak VO2 30.2, VE/VCO2 slope 31 => no obvious cardiac limitation.  4. LV thrombus: On warfarin.   SH: On disability, nonsmoker, no ETOH/drugs.  FH: Mother with CHF but developed later in life (around 41).  No sudden cardiac death.   Review of systems complete and found to be negative unless listed in HPI.    Current Outpatient Medications  Medication Sig Dispense Refill  . digoxin (LANOXIN) 0.125 MG tablet Take 1 tablet (125 mcg total) daily by mouth. 90 tablet 3  . furosemide (LASIX) 40 MG tablet Take 1.5 tablets (60 mg total) by mouth 2 (two) times daily. 90 tablet 3  . hydrALAZINE (APRESOLINE) 25 MG tablet Take 1 tablet (25 mg total) by mouth 3 (three) times daily. 90 tablet 3  . isosorbide mononitrate (IMDUR) 30 MG 24 hr tablet Take 1 tablet (30 mg total) by mouth daily. 30 tablet 3  . KLOR-CON M20 20 MEQ tablet Take 1 tablet (20 mEq total) daily by mouth. 90 tablet 3  . warfarin (COUMADIN) 5 MG tablet TAKE AS DIRECTED BY COUMADIN CLINIC 180 tablet 1  . carvedilol (COREG) 3.125 MG tablet Take 1 tablet (3.125 mg total) by mouth 2 (two) times daily. (Patient not  taking: Reported on 10/10/2017) 60 tablet 3  . famotidine (PEPCID) 20 MG tablet TAKE 1 TABLET BY MOUTH TWICE A DAY (Patient not taking: Reported on 09/14/2017) 60 tablet 2  . guaiFENesin-dextromethorphan (ROBITUSSIN DM) 100-10 MG/5ML syrup Take 5 mLs by mouth every 4 (four) hours as needed for cough. (Patient not taking: Reported on 09/14/2017) 118 mL 0  . ipratropium (ATROVENT) 0.06 % nasal spray Place 2 sprays into both nostrils 4 (four) times daily. (Patient not taking: Reported on 09/14/2017) 15 mL 12  . sacubitril-valsartan (ENTRESTO) 24-26 MG Take 1 tablet by  mouth 2 (two) times daily. 60 tablet 3  . spironolactone (ALDACTONE) 25 MG tablet Take 0.5 tablets (12.5 mg total) by mouth daily. (Patient not taking: Reported on 10/10/2017) 30 tablet 3   No current facility-administered medications for this encounter.    BP 115/87   Pulse 73   Wt 258 lb (117 kg)   SpO2 98%   BMI 35.98 kg/m    Wt Readings from Last 3 Encounters:  10/10/17 258 lb (117 kg)  09/14/17 260 lb (117.9 kg)  08/25/17 259 lb 8 oz (117.7 kg)    Physical Exam General: NAD Neck: No JVD, no thyromegaly or thyroid nodule.  Lungs: Clear to auscultation bilaterally with normal respiratory effort. CV: Nondisplaced PMI.  Heart regular S1/S2, no S3/S4, no murmur.  No peripheral edema.  No carotid bruit.  Normal pedal pulses.  Abdomen: Soft, nontender, no hepatosplenomegaly, no distention.  Skin: Intact without lesions or rashes.  Neurologic: Alert and oriented x 3.  Psych: Normal affect. Extremities: No clubbing or cyanosis.  HEENT: Normal.   Assessment/Plan: 1. Chronic systolic CHF: Nonischemic cardiomyopathy.  EF 10-15% by echo in 12/17.  No coronary disease and low output on cath.  HIV negative, SPEP negative.  No family history of early cardiomyopathy (mother with CHF around 73).  No ETOH/drugs. Possible viral myocarditis.  Cardiac MRI in 4/18 with severe LV dilation, EF 24%, RV insertion site LGE (nonspecific), cannot rule out noncompaction.  Echo in 5/19 showed EF 25-30% with severe LV dilation.  CPX in 7/19 was quite good with minimal cardiac limitation.  NYHA class II symptoms.  He is not volume overloaded on exam. He admits today to significant noncompliance with meds probably due to severe anxiety.  - Continue Lasix 60 mg bid, BMET today.  - Continue digoxin 0.125 and check level today. - Long discussion about why he needs the medications and possible side effects.  Assured him that we can stop medications if he has side effects.  He already has Entresto but has not been  taking it.  Therefore, will stop losartan, start Entresto 24/26 bid with BMET in 10 days.  He will followup with HF pharmacist in 3 wks.  If BP stable, he will start on Coreg 3.125 mg bid. I will then see him in 6 wks and if he remains stable, will start spironolactone 12.5 mg daily at that time.  - Continue current hydralazine/Imdur.  - With persistently low EF and young age, I recommended ICD despite nonischemic etiology.  I will make him an appointment to talk with EP => he says that he will go to the appointment but has a lot of anxiety about ICD. He is not a CRT candidate with relatively narrow QRS.  2. LV thrombus: Not seen on cMRI in 4/18 or echo in 5/19.   - Continue warfarin for anticoagulation   Followup with CHF pharmacist in 3 weeks for medication titration and  see me in 6 wks.   Marca Ancona, MD  10/10/2017

## 2017-10-11 ENCOUNTER — Encounter (HOSPITAL_COMMUNITY): Payer: Medicare Other

## 2017-10-17 ENCOUNTER — Encounter (HOSPITAL_COMMUNITY): Payer: Self-pay | Admitting: *Deleted

## 2017-10-17 ENCOUNTER — Ambulatory Visit (INDEPENDENT_AMBULATORY_CARE_PROVIDER_SITE_OTHER): Payer: Medicare Other | Admitting: *Deleted

## 2017-10-17 DIAGNOSIS — I513 Intracardiac thrombosis, not elsewhere classified: Secondary | ICD-10-CM | POA: Diagnosis not present

## 2017-10-17 DIAGNOSIS — Z7901 Long term (current) use of anticoagulants: Secondary | ICD-10-CM

## 2017-10-17 LAB — POCT INR: INR: 1.8 — AB (ref 2.0–3.0)

## 2017-10-19 ENCOUNTER — Telehealth: Payer: Self-pay | Admitting: *Deleted

## 2017-10-19 NOTE — Telephone Encounter (Signed)
Tried calling patient but number "could not be completed as dialed".  I checked the number again and it was entered correctly.  Will try to reach him later today. Laterra Lubinski,CMA

## 2017-10-19 NOTE — Telephone Encounter (Signed)
-----   Message from Tillman Sers, DO sent at 10/19/2017  9:50 AM EDT ----- Regarding: FW: letter   Please let patient know heart failure, as far as I know, is not a qualifying diagnosis to have power kept on. Also, his heart failure doctor can write him a letter if he sees a compelling reason to do so. Thanks.  ----- Message ----- From: Jennette Bill, CMA Sent: 10/17/2017  12:05 PM To: Tillman Sers, DO Subject: letter                                         Patient says he needs a letter stating that he has chronic heart failure and it is important that his power remains on so he can get assistance with his energy bill with [employer].

## 2017-10-20 ENCOUNTER — Ambulatory Visit (HOSPITAL_COMMUNITY)
Admission: RE | Admit: 2017-10-20 | Discharge: 2017-10-20 | Disposition: A | Discharge status: Home / Self Care | Payer: Medicare Other | Source: Ambulatory Visit | Attending: Cardiology | Admitting: Cardiology

## 2017-10-20 DIAGNOSIS — I5022 Chronic systolic (congestive) heart failure: Secondary | ICD-10-CM | POA: Diagnosis not present

## 2017-10-20 LAB — BASIC METABOLIC PANEL
Anion gap: 8 (ref 5–15)
BUN: 10 mg/dL (ref 6–20)
CHLORIDE: 105 mmol/L (ref 98–111)
CO2: 26 mmol/L (ref 22–32)
Calcium: 9.2 mg/dL (ref 8.9–10.3)
Creatinine, Ser: 1.4 mg/dL — ABNORMAL HIGH (ref 0.61–1.24)
GFR calc non Af Amer: >60 mL/min (ref >60)
Glucose, Bld: 132 mg/dL — ABNORMAL HIGH (ref 70–99)
POTASSIUM: 3.9 mmol/L (ref 3.5–5.1)
Sodium: 139 mmol/L (ref 135–145)

## 2017-10-20 NOTE — Telephone Encounter (Signed)
Unable to reach patient with number listed in chart.  Will let provider know so we can possibly send him a letter.

## 2017-10-20 NOTE — Telephone Encounter (Signed)
Mr. Nesci has been difficult to reach in the past. If he calls the office I would ask him to have his heart failure doctor write him a letter for power company. Thanks.

## 2017-10-21 ENCOUNTER — Other Ambulatory Visit: Payer: Self-pay | Admitting: Family Medicine

## 2017-10-21 DIAGNOSIS — K219 Gastro-esophageal reflux disease without esophagitis: Secondary | ICD-10-CM

## 2017-10-24 ENCOUNTER — Encounter: Payer: Self-pay | Admitting: Family Medicine

## 2017-10-24 ENCOUNTER — Other Ambulatory Visit (HOSPITAL_COMMUNITY)
Admission: RE | Admit: 2017-10-24 | Discharge: 2017-10-24 | Disposition: A | Discharge status: Home / Self Care | Payer: Medicare Other | Source: Ambulatory Visit | Attending: Family Medicine | Admitting: Family Medicine

## 2017-10-24 ENCOUNTER — Ambulatory Visit (INDEPENDENT_AMBULATORY_CARE_PROVIDER_SITE_OTHER): Payer: Medicare Other | Admitting: Family Medicine

## 2017-10-24 ENCOUNTER — Other Ambulatory Visit: Payer: Self-pay

## 2017-10-24 VITALS — BP 108/72 | HR 88 | Temp 98.4°F | Ht 71.0 in | Wt 257.2 lb

## 2017-10-24 DIAGNOSIS — I513 Intracardiac thrombosis, not elsewhere classified: Secondary | ICD-10-CM | POA: Diagnosis not present

## 2017-10-24 DIAGNOSIS — Z7901 Long term (current) use of anticoagulants: Secondary | ICD-10-CM

## 2017-10-24 DIAGNOSIS — Z114 Encounter for screening for human immunodeficiency virus [HIV]: Secondary | ICD-10-CM

## 2017-10-24 DIAGNOSIS — Z113 Encounter for screening for infections with a predominantly sexual mode of transmission: Secondary | ICD-10-CM | POA: Insufficient documentation

## 2017-10-24 LAB — POCT INR: INR: 1.5 — AB (ref 2.0–3.0)

## 2017-10-24 NOTE — Patient Instructions (Signed)
Thank you for coming in to see [place] today. Please see below to review our plan for today's visit.  I will call you in approximately 48-72 hours if any of your labs are abnormal otherwise you should receive results in the mail.  Please call the clinic at [phone] if your symptoms worsen or you have any concerns. It was our pleasure to serve you.  Durward Parcel, DO [facility], PGY-3

## 2017-10-24 NOTE — Progress Notes (Signed)
   Subjective   Patient ID: Kenneth Robertson    DOB: [birth year], 37 y.o. male   MRN: [id number]  CC: "STD check"  HPI: Kenneth Robertson is a 37 y.o. male who presents for a same day appointment for the following:  STD screening: Patient has no prior history of STDs.  He is here today for STD screening due to concern after meeting a new partner yesterday who is transitioning from male to male.  He denies having intercourse with individual though did find out that his prior partner was having intercourse with this individual who is removed to have "a lot of disease."  Patient did not use condoms with his prior partner.  Endorses history of anal anal intercourse.  Patient denies history of fevers or chills, dysuria or penile discharge, sore throat, weight loss.  ROS: see HPI for pertinent.  PMFSH: CHF, GERD.  Surgical history tooth extraction, cardiac cath, left eye surgery.  Family history heart failure, cancer.  Smoking status reviewed. Medications reviewed.  Objective   BP 108/72   Pulse 88   Temp 98.4 F (36.9 C) (Oral)   Ht 5\' 11"  (1.803 m)   Wt 257 lb 3.2 oz (116.7 kg)   SpO2 97%   BMI 35.87 kg/m  Vitals and nursing note reviewed.  General: well nourished, well developed, NAD with non-toxic appearance HEENT: normocephalic, atraumatic, moist mucous membranes Neck: supple, non-tender without lymphadenopathy Cardiovascular: regular rate and rhythm without murmurs, rubs, or gallops Lungs: clear to auscultation bilaterally with normal work of breathing Skin: warm, dry, no rashes or lesions, cap refill < 2 seconds GU: testicles symmetrical without tenderness or palpable masses, no lesions in penile shaft or glans without signs of discharge or erythema, cremaster reflex present bilaterally  Assessment & Plan   Screening examination for STD (sexually transmitted disease) Asymptomatic.  Would like full screening.  No prior STDs.  High risk sexual behavior without use of protection, MSM. -  Discussed importance of safe sex practices including condom use - Checking HIV antibody, RPR, GC/chlamydia urine cytology  Orders Placed This Encounter  Procedures  . RPR  . HIV antibody   No orders of the defined types were placed in this encounter.   Durward Parcel, DO [facility], PGY-3 10/24/2017, 4:34 PM

## 2017-10-24 NOTE — Assessment & Plan Note (Signed)
Asymptomatic.  Would like full screening.  No prior STDs.  High risk sexual behavior without use of protection, MSM. - Discussed importance of safe sex practices including condom use - Checking HIV antibody, RPR, GC/chlamydia urine cytology

## 2017-10-25 LAB — RPR: RPR Ser Ql: NONREACTIVE

## 2017-10-25 LAB — HIV ANTIBODY (ROUTINE TESTING W REFLEX): HIV Screen 4th Generation wRfx: NONREACTIVE

## 2017-10-26 ENCOUNTER — Telehealth: Payer: Self-pay | Admitting: *Deleted

## 2017-10-26 LAB — URINE CYTOLOGY ANCILLARY ONLY
Chlamydia: NEGATIVE
NEISSERIA GONORRHEA: NEGATIVE

## 2017-10-26 NOTE — Telephone Encounter (Signed)
Pt would like to know the results of his test from 10/24/17.  Araseli Sherry, Maryjo Rochester, CMA

## 2017-10-31 ENCOUNTER — Ambulatory Visit (INDEPENDENT_AMBULATORY_CARE_PROVIDER_SITE_OTHER): Payer: Medicare Other | Admitting: *Deleted

## 2017-10-31 ENCOUNTER — Encounter: Payer: Self-pay | Admitting: Family Medicine

## 2017-10-31 ENCOUNTER — Ambulatory Visit (HOSPITAL_COMMUNITY): Admission: RE | Admit: 2017-10-31 | Payer: Medicare Other | Source: Ambulatory Visit

## 2017-10-31 DIAGNOSIS — Z7901 Long term (current) use of anticoagulants: Secondary | ICD-10-CM

## 2017-10-31 DIAGNOSIS — I513 Intracardiac thrombosis, not elsewhere classified: Secondary | ICD-10-CM

## 2017-10-31 LAB — POCT INR: INR: 1.4 — AB (ref 2.0–3.0)

## 2017-10-31 NOTE — Progress Notes (Signed)
Patient was instructed to take 10 mg twice a week and 5 mg all other days. Instead he took 10 mg only one day a week. Patient given instructions again and stressed the importance of following dosing instructions for his coumadin. Busick, Rodena Medin

## 2017-11-02 ENCOUNTER — Other Ambulatory Visit (HOSPITAL_COMMUNITY): Payer: Self-pay | Admitting: Student

## 2017-11-06 ENCOUNTER — Other Ambulatory Visit (HOSPITAL_COMMUNITY): Payer: Self-pay | Admitting: Student

## 2017-11-06 ENCOUNTER — Other Ambulatory Visit (HOSPITAL_COMMUNITY): Payer: Self-pay | Admitting: Cardiology

## 2017-11-07 ENCOUNTER — Ambulatory Visit (INDEPENDENT_AMBULATORY_CARE_PROVIDER_SITE_OTHER): Payer: Medicare Other | Admitting: *Deleted

## 2017-11-07 DIAGNOSIS — Z7901 Long term (current) use of anticoagulants: Secondary | ICD-10-CM

## 2017-11-07 DIAGNOSIS — I513 Intracardiac thrombosis, not elsewhere classified: Secondary | ICD-10-CM

## 2017-11-07 LAB — POCT INR: INR: 2 (ref 2.0–3.0)

## 2017-11-14 ENCOUNTER — Ambulatory Visit: Payer: Medicare Other

## 2017-11-15 ENCOUNTER — Inpatient Hospital Stay (HOSPITAL_COMMUNITY): Admission: RE | Admit: 2017-11-15 | Payer: Medicare Other | Source: Ambulatory Visit

## 2017-11-16 ENCOUNTER — Ambulatory Visit (HOSPITAL_COMMUNITY)
Admission: RE | Admit: 2017-11-16 | Discharge: 2017-11-16 | Disposition: A | Discharge status: Home / Self Care | Payer: Medicare Other | Source: Ambulatory Visit | Attending: Cardiology | Admitting: Cardiology

## 2017-11-16 VITALS — BP 132/86 | HR 74 | Wt 259.0 lb

## 2017-11-16 DIAGNOSIS — I5022 Chronic systolic (congestive) heart failure: Secondary | ICD-10-CM | POA: Insufficient documentation

## 2017-11-16 DIAGNOSIS — I110 Hypertensive heart disease with heart failure: Secondary | ICD-10-CM | POA: Diagnosis not present

## 2017-11-16 DIAGNOSIS — I429 Cardiomyopathy, unspecified: Secondary | ICD-10-CM | POA: Diagnosis not present

## 2017-11-16 LAB — BASIC METABOLIC PANEL
Anion gap: 6 (ref 5–15)
BUN: 15 mg/dL (ref 6–20)
CALCIUM: 8.9 mg/dL (ref 8.9–10.3)
CO2: 25 mmol/L (ref 22–32)
CREATININE: 1.3 mg/dL — AB (ref 0.61–1.24)
Chloride: 107 mmol/L (ref 98–111)
GFR calc Af Amer: >60 mL/min (ref >60)
GLUCOSE: 127 mg/dL — AB (ref 70–99)
Potassium: 4 mmol/L (ref 3.5–5.1)
SODIUM: 138 mmol/L (ref 135–145)

## 2017-11-16 MED ORDER — FUROSEMIDE 40 MG PO TABS: ORAL_TABLET | ORAL | 5 refills | Status: DC

## 2017-11-16 MED ORDER — ISOSORBIDE MONONITRATE ER 30 MG PO TB24: 30.0000 mg | ORAL_TABLET | Freq: Every day | ORAL | 5 refills | Status: DC

## 2017-11-16 MED ORDER — SPIRONOLACTONE 25 MG PO TABS: 12.5000 mg | ORAL_TABLET | Freq: Every day | ORAL | 5 refills | Status: DC

## 2017-11-16 NOTE — Progress Notes (Signed)
Kenneth Robertson: [facility]  HPI:  37 y.o. with history of HTN and nonischemic cardiomyopathy presents for followup of CHF.  He was admitted in 12/17 after 1 month of worsening dyspnea.  He was volume overloaded on exam with EF 10-15% on echo.  He was diuresed and RHC/LHC done.  This showed no CAD. Cardiac output was low.  Cardiac MRI in 4/18 showed severe LV dilation, EF 24%, prominent trabeculation (possible noncompaction), RV insertion LGE (nonspecific).    Last echo was in 5/19.  The LV was severely dilated with EF 25-30%.  CPX done in 7/19 surprisingly did not show a significant CHF limitation.   Patient returns for pharmacist-led Kenneth medication titration. At last Kenneth clinic visit on 7/8, he was switched from losartan to Entresto 24-26 mg BID. He stated that his lips were getting chapped and looked more blue in color so he reduced his furosemide to 80 mg once daily from 60 mg BID. He states that he has been more compliant with his medications recently although he recently ran out of his Imdur. He does also state that he has been having some "chest pressure" but no other symptoms with this. Seems like it may be related to his anxiety.   . Shortness of breath/dyspnea on exertion? no  . Orthopnea/PND? no . Edema? no . Lightheadedness/dizziness? no . Daily weights at home? no . Blood pressure/heart rate monitoring at home? no . Following low-sodium/fluid-restricted diet? yes  Kenneth Medications: Digoxin 0.125 mg PO daily Furosemide 80 mg PO daily Hydralazine 25 mg PO TID Imdur 30 mg PO daily KCl 20 mEq PO daily Entresto 24-26 mg PO BID  Has the patient been experiencing any side effects to the medications prescribed?  Yes - upset stomach with Entresto  Does the patient have any problems obtaining medications due to transportation or finances?   No -  Medicaid  Understanding of regimen: fair Understanding of indications: fair Potential of compliance: fair Patient understands to avoid  NSAIDs. Patient understands to avoid decongestants.    Pertinent Lab Values: . 11/16/17: Serum creatinine 1.30 (BL ~1.2-1.4), BUN 15, Potassium 4.0, Sodium 138 . 10/10/17: Digoxin 0.3   Vital Signs: . Weight: 259 lb (dry weight: 257 lb) . Blood pressure: 132/86 (108-126) . Heart rate: 74 bpm (73-88) . ReDS Clip: 46%   Assessment: 1. Chronicsystolic CHF (EF 41-66%), due to NICM (?viral). NYHA class IIsymptoms. - Volume status slightly elevated based on weight, symptoms and ReDS vest reading - Restart spironolactone 12.5 mg daily and resume previous furosemide dose of 80 mg QAM/40 mg QPM - Continue digoxin 0.125 mg daily, hydralazine 25 mg TID, restart Imdur 30 mg daily, KCl 20 meq daily and Entresto 24-26 mg BID - Basic disease state pathophysiology, medication indication, mechanism and side effects reviewed at length with patient and he verbalized understanding  2. LV thrombus: Not seen on cMRI in 4/18 or echo in 5/19.   - Continue warfarin for anticoagulation   Plan: 1) Medication changes: Based on clinical presentation, vital signs and recent labs will start spironolactone 12.5 mg daily 2) Labs: BMET today and in 1 week 3) Follow-up: Dr. [name] on 9/4   Tyler Deis. Bonnye Fava, PharmD, BCPS, CPP Clinical Pharmacist Phone: [phone] 11/16/2017 8:40 AM

## 2017-11-16 NOTE — Patient Instructions (Signed)
It was great to see you today!  Please START spironolactone 12.5 mg (1/2 tablet) DAILY.  Please RESTART furosemide 80 mg (2 tablets) in the morning and 40 mg (1 tablet) in the afternoon.  Please RESTART isosorbide mononitrate 30 mg (1 tablet) DAILY.   Blood work today. We will call you with any changes.   You are scheduled for blood work again on 8/21.   Please keep your appointment with Dr. [name] on 9/4.

## 2017-11-23 ENCOUNTER — Other Ambulatory Visit (HOSPITAL_COMMUNITY): Payer: Medicare Other

## 2017-12-07 ENCOUNTER — Ambulatory Visit (HOSPITAL_COMMUNITY)
Admission: RE | Admit: 2017-12-07 | Discharge: 2017-12-07 | Disposition: A | Discharge status: Home / Self Care | Payer: Medicare Other | Source: Ambulatory Visit | Attending: Cardiology | Admitting: Cardiology

## 2017-12-07 VITALS — BP 110/73 | HR 88 | Wt 253.0 lb

## 2017-12-07 DIAGNOSIS — Z7901 Long term (current) use of anticoagulants: Secondary | ICD-10-CM | POA: Diagnosis not present

## 2017-12-07 DIAGNOSIS — F329 Major depressive disorder, single episode, unspecified: Secondary | ICD-10-CM | POA: Insufficient documentation

## 2017-12-07 DIAGNOSIS — I110 Hypertensive heart disease with heart failure: Secondary | ICD-10-CM | POA: Diagnosis not present

## 2017-12-07 DIAGNOSIS — I5022 Chronic systolic (congestive) heart failure: Secondary | ICD-10-CM

## 2017-12-07 DIAGNOSIS — I429 Cardiomyopathy, unspecified: Secondary | ICD-10-CM | POA: Diagnosis not present

## 2017-12-07 DIAGNOSIS — I240 Acute coronary thrombosis not resulting in myocardial infarction: Secondary | ICD-10-CM | POA: Insufficient documentation

## 2017-12-07 DIAGNOSIS — R0683 Snoring: Secondary | ICD-10-CM

## 2017-12-07 DIAGNOSIS — I513 Intracardiac thrombosis, not elsewhere classified: Secondary | ICD-10-CM

## 2017-12-07 DIAGNOSIS — Z8249 Family history of ischemic heart disease and other diseases of the circulatory system: Secondary | ICD-10-CM | POA: Diagnosis not present

## 2017-12-07 DIAGNOSIS — Z79899 Other long term (current) drug therapy: Secondary | ICD-10-CM | POA: Insufficient documentation

## 2017-12-07 LAB — BASIC METABOLIC PANEL
ANION GAP: 9 (ref 5–15)
BUN: 12 mg/dL (ref 6–20)
CHLORIDE: 105 mmol/L (ref 98–111)
CO2: 23 mmol/L (ref 22–32)
CREATININE: 1.37 mg/dL — AB (ref 0.61–1.24)
Calcium: 9.3 mg/dL (ref 8.9–10.3)
GFR calc non Af Amer: >60 mL/min (ref >60)
Glucose, Bld: 121 mg/dL — ABNORMAL HIGH (ref 70–99)
Potassium: 4.3 mmol/L (ref 3.5–5.1)
SODIUM: 137 mmol/L (ref 135–145)

## 2017-12-07 MED ORDER — CARVEDILOL 3.125 MG PO TABS: 3.1250 mg | ORAL_TABLET | Freq: Two times a day (BID) | ORAL | 3 refills | Status: DC

## 2017-12-07 MED ORDER — SACUBITRIL-VALSARTAN 24-26 MG PO TABS: 1.0000 | ORAL_TABLET | Freq: Two times a day (BID) | ORAL | 6 refills | Status: DC

## 2017-12-07 MED ORDER — FUROSEMIDE 40 MG PO TABS: 80.0000 mg | ORAL_TABLET | Freq: Every day | ORAL | 6 refills | Status: DC

## 2017-12-07 NOTE — Patient Instructions (Signed)
Stop Losartan  Start Carvedilol 3.125 mg Twice daily   Start Entresto 24/26 mg Twice daily   Decrease Furosemide to 80 mg daily  Labs today  Labs in 1 week  You have been referred to [facility], they will call you to schedule  Your physician has recommended that you have a sleep study. This test records several body functions during sleep, including: brain activity, eye movement, oxygen and carbon dioxide blood levels, heart rate and rhythm, breathing rate and rhythm, the flow of air through your mouth and nose, snoring, body muscle movements, and chest and belly movement.  Please follow up with Cicero Duck, our heart failure pharmacist in 3 weeks  Your physician recommends that you schedule a follow-up appointment in: 6 weeks

## 2017-12-08 NOTE — Progress Notes (Signed)
Advanced Heart Failure Clinic Note   PCP: Dr. Leonides Schanz Cardiology: Dr. [name]  37 y.o. with history of HTN and nonischemic cardiomyopathy presents for followup of CHF.  He was admitted in 12/17 after 1 month of worsening dyspnea.  He was volume overloaded on exam with EF 10-15% on echo.  He was diuresed and RHC/LHC done.  This showed no CAD. Cardiac output was low.  Cardiac MRI in 4/18 showed severe LV dilation, EF 24%, prominent trabeculation (possible noncompaction), RV insertion LGE (nonspecific).    Last echo was in 5/19.  The LV was severely dilated with EF 25-30%.  CPX done in 7/19 surprisingly did not show a significant CHF limitation.   At last appointment, he had not been compliant with his meds.  He seems to be doing better now.  He is not on Entresto right now as he apparently had trouble getting a refill.  He went back on losartan. He has lost another 6 lbs.  He says that his breathing is doing well.  He is swimming for exercise.  He can walk on flat ground and climb steps without dyspnea.  No lightheadedness.  No chest pain.  He snores and has daytime sleepiness.   Labs (1/18): K 4.3, creatinine 1.47, BNP 243, TSH normal, HIV negative, SPEP negative Labs (4/18): BNP 390, hgb 14.4, K 4.3, creatinine 1.33 Labs (7/18): K 4.3, creatinine 1.4 Labs (2/19): K 3.9, creatinine 1.43, digoxin < 0.2 Labs (5/19): K 4, creatinine 1.21, hgb 15.7, digoxin 0.5 Labs (7/19): HIV negative, digoxin 0.3 Labs (8/19): K 4, creatinine 1.3  PMH: 1. HTN 2. Depression 3. Chronic systolic CHF: Nonischemic cardiomyopathy, diagnosed in 12/17.  - Echo (12/17) with EF 10-15%, LV thrombus, moderate MR, PASP 47 mmHg, moderately dilated and dysfunctional RV.  - Echo (7/18) with EF 15%, moderate MR. PASP 40 mm Hg.  - LHC/RHC (12/17): No CAD; mean RA 8, PA 41/22, mean PCWP 20, CI 1.7.  - HIV, SPEP, TSH negative.  - Cardiac MRI in 4/18 showed severe LV dilation, EF 24%, prominent trabeculation (possible  noncompaction), RV insertion LGE (nonspecific), no LV thrombus.   - Echo (7/18): EF 15%, severe LV dilation with diffuse HK, grade 3 diastolic dysfunction, mild-moderate MR, PASP 40 mmHg.  - Echo (5/19): EF 25-30%, severe LV dilation with diffuse hypokinesis, mild MR, mild RV dilation with normal systolic function.  - CPX (7/19): RER 1.08, peak VO2 30.2, VE/VCO2 slope 31 => no obvious cardiac limitation.  4. LV thrombus: On warfarin.   SH: On disability, nonsmoker, no ETOH/drugs.  FH: Mother with CHF but developed later in life (around 21).  No sudden cardiac death.   Review of systems complete and found to be negative unless listed in HPI.    Current Outpatient Medications  Medication Sig Dispense Refill  . digoxin (LANOXIN) 0.125 MG tablet Take 1 tablet (125 mcg total) daily by mouth. 90 tablet 3  . furosemide (LASIX) 40 MG tablet Take 2 tablets (80 mg total) by mouth daily. 30 tablet 6  . hydrALAZINE (APRESOLINE) 25 MG tablet Take 1 tablet (25 mg total) by mouth 3 (three) times daily. 90 tablet 3  . isosorbide mononitrate (IMDUR) 30 MG 24 hr tablet Take 1 tablet (30 mg total) by mouth daily. 30 tablet 5  . KLOR-CON M20 20 MEQ tablet Take 1 tablet (20 mEq total) daily by mouth. 90 tablet 3  . spironolactone (ALDACTONE) 25 MG tablet Take 0.5 tablets (12.5 mg total) by mouth daily. 15 tablet 5  .  warfarin (COUMADIN) 5 MG tablet TAKE AS DIRECTED BY COUMADIN CLINIC 180 tablet 1  . carvedilol (COREG) 3.125 MG tablet Take 1 tablet (3.125 mg total) by mouth 2 (two) times daily. 60 tablet 3  . sacubitril-valsartan (ENTRESTO) 24-26 MG Take 1 tablet by mouth 2 (two) times daily. 60 tablet 6   No current facility-administered medications for this encounter.    BP 110/73   Pulse 88   Wt 114.8 kg (253 lb)   SpO2 99%   BMI 35.29 kg/m    Wt Readings from Last 3 Encounters:  12/07/17 114.8 kg (253 lb)  11/16/17 117.5 kg (259 lb)  10/24/17 116.7 kg (257 lb 3.2 oz)    Physical Exam General:  NAD Neck: No JVD, no thyromegaly or thyroid nodule.  Lungs: Clear to auscultation bilaterally with normal respiratory effort. CV: Nondisplaced PMI.  Heart regular S1/S2, no S3/S4, no murmur.  No peripheral edema.  No carotid bruit.  Normal pedal pulses.  Abdomen: Soft, nontender, no hepatosplenomegaly, no distention.  Skin: Intact without lesions or rashes.  Neurologic: Alert and oriented x 3.  Psych: Normal affect. Extremities: No clubbing or cyanosis.  HEENT: Normal.   Assessment/Plan: 1. Chronic systolic CHF: Nonischemic cardiomyopathy.  EF 10-15% by echo in 12/17.  No coronary disease and low output on cath.  HIV negative, SPEP negative.  No family history of early cardiomyopathy (mother with CHF around 52).  No ETOH/drugs. Possible viral myocarditis.  Cardiac MRI in 4/18 with severe LV dilation, EF 24%, RV insertion site LGE (nonspecific), cannot rule out noncompaction.  Echo in 5/19 showed EF 25-30% with severe LV dilation.  CPX in 7/19 was quite good with minimal cardiac limitation.  NYHA class II symptoms.  He is not volume overloaded on exam. He has been doing better about taking his medications.  - Stop losartan, go back on Entresto 24/26 bid.  He will decrease Lasix to 80 mg daily since he is starting entresto.  BMET today and again in 10 days.   - Add Coreg 3.125 mg bid.  - Continue digoxin 0.125 and check level today. - Continue spironolactone 12.5 daily.  - Continue current hydralazine/Imdur.  - With persistently low EF and young age, I recommended ICD despite nonischemic etiology.  He is not a CRT candidate with relatively narrow QRS. He is still thinking about the ICD and does not want EP referral yet. Will plan to get a repeat echo in 11/19 (6 months post-last echo).  If EF remains low, will again encourage ICD.  - I will refer him to the [facility] PREP class.  2. LV thrombus: Not seen on cMRI in 4/18 or echo in 5/19.   - Continue warfarin for anticoagulation  3. ?OSA: Strong  clinical suspicion for OSA.  Will order sleep study.   Followup with CHF pharmacist in 3 weeks for medication titration and see me in 6 wks.   Marca Ancona, MD  12/08/2017

## 2017-12-14 ENCOUNTER — Telehealth: Payer: Self-pay

## 2017-12-14 NOTE — Telephone Encounter (Signed)
Received referral for Kenneth Robertson to participate in the PREP at [facility], today.  Called the phone number listed but no VM set up.  Texted to the same number asking him to call me back.  Will try again later in the week if I get no response.

## 2017-12-19 ENCOUNTER — Other Ambulatory Visit (HOSPITAL_COMMUNITY): Payer: Medicare Other

## 2017-12-20 ENCOUNTER — Ambulatory Visit (HOSPITAL_COMMUNITY)
Admission: RE | Admit: 2017-12-20 | Discharge: 2017-12-20 | Disposition: A | Discharge status: Home / Self Care | Payer: Medicare Other | Source: Ambulatory Visit | Attending: Internal Medicine | Admitting: Internal Medicine

## 2017-12-20 DIAGNOSIS — I5022 Chronic systolic (congestive) heart failure: Secondary | ICD-10-CM | POA: Diagnosis not present

## 2017-12-20 LAB — BASIC METABOLIC PANEL
Anion gap: 9 (ref 5–15)
BUN: 13 mg/dL (ref 6–20)
CO2: 25 mmol/L (ref 22–32)
Calcium: 9.1 mg/dL (ref 8.9–10.3)
Chloride: 103 mmol/L (ref 98–111)
Creatinine, Ser: 1.33 mg/dL — ABNORMAL HIGH (ref 0.61–1.24)
GFR calc Af Amer: >60 mL/min (ref >60)
GLUCOSE: 158 mg/dL — AB (ref 70–99)
Potassium: 4.1 mmol/L (ref 3.5–5.1)
Sodium: 137 mmol/L (ref 135–145)

## 2017-12-28 ENCOUNTER — Inpatient Hospital Stay (HOSPITAL_COMMUNITY)
Admission: RE | Admit: 2017-12-28 | Discharge: 2017-12-28 | Disposition: A | Discharge status: Home / Self Care | Payer: Medicare Other | Source: Ambulatory Visit

## 2017-12-30 ENCOUNTER — Ambulatory Visit (HOSPITAL_BASED_OUTPATIENT_CLINIC_OR_DEPARTMENT_OTHER): Payer: Medicare Other | Attending: Cardiology

## 2018-01-02 ENCOUNTER — Ambulatory Visit (HOSPITAL_COMMUNITY): Payer: Medicare Other

## 2018-01-04 ENCOUNTER — Encounter (HOSPITAL_BASED_OUTPATIENT_CLINIC_OR_DEPARTMENT_OTHER): Payer: Medicare Other
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
## 2018-01-19 ENCOUNTER — Encounter (HOSPITAL_COMMUNITY): Payer: Medicare Other | Admitting: Cardiology

## 2018-01-23 NOTE — Addendum Note (Signed)
Encounter addended by: Elvin So, RPH-CPP on: 01/23/2018 11:29 AM  Actions taken: Delete clinical note

## 2018-01-25 ENCOUNTER — Inpatient Hospital Stay (HOSPITAL_COMMUNITY)
Admission: RE | Admit: 2018-01-25 | Discharge: 2018-01-25 | Disposition: A | Discharge status: Home / Self Care | Payer: Medicare Other | Source: Ambulatory Visit

## 2018-01-25 NOTE — Progress Notes (Signed)
[facility] PREP Weekly Session   Patient Details  Name: Kenneth Robertson MRN: [id number] Date of Birth: 05-14-1980 Age: 37 y.o. PCP: Tillman Sers, DO  Vitals:   01/24/18 1508  Weight: 258 lb (117 kg)    [name] Weekly seesion - 01/25/18 1500      Weekly Session   Topic Discussed  Health habits;Water    Minutes exercised this week  120 minutes   cardio     Fun things you did since last meeting:"walked around the mall" Things you are grateful for:"no heavy breathing" Nutrition celebrations:"ate more greens" Barriers:"stress"  Rose Fillers 01/25/2018, 3:08 PM

## 2018-02-03 NOTE — Progress Notes (Signed)
[facility] PREP Weekly Session   Patient Details  Name: Kenneth Robertson MRN: [id number] Date of Birth: 17-Sep-1980 Age: 37 y.o. PCP: Tillman Sers, DO  Vitals:   01/31/18 1110  Weight: 256 lb (116.1 kg)    [name] Weekly seesion - 02/03/18 1100      Weekly Session   Topic Discussed  Other ways to be active    Minutes exercised this week  120 minutes   "4cardio/23mins strength"     Fun things you did since last meeting:"[employer] homecoming" Things you are grateful for:"Life and feeling good" Barriers:"stress"  Rose Fillers 02/03/2018, 11:11 AM

## 2018-02-07 NOTE — Progress Notes (Signed)
[facility] PREP Weekly Session   Patient Details  Name: Kenneth Robertson MRN: [id number] Date of Birth: 1980-08-09 Age: 37 y.o. PCP: Tillman Sers, DO  Vitals:   02/07/18 1943  Weight: 248 lb (112.5 kg)    [facility] Weekly seesion - 02/07/18 1900      Weekly Session   Topic Discussed  Health habits;Water    Minutes exercised this week  36 minutes   cardio 6hrs/strength 3hrs/flexibility 3hrs"   Classes attended to date  6      Fun things you did since last meeting:"swimming" Things you are grateful for:"life and feeling good" Nutrition celebrations:"ate some cucumbers in a salad" Barriers:"stress"  Rose Fillers 02/07/2018, 7:45 PM

## 2018-02-14 ENCOUNTER — Other Ambulatory Visit (HOSPITAL_COMMUNITY): Payer: Self-pay | Admitting: Cardiology

## 2018-02-18 ENCOUNTER — Other Ambulatory Visit (HOSPITAL_COMMUNITY): Payer: Self-pay | Admitting: Cardiology

## 2018-02-22 NOTE — Progress Notes (Signed)
[facility] PREP Weekly Session   Patient Details  Name: Kenneth Robertson MRN: [id number] Date of Birth: [birth year] Age: 37 y.o. PCP: Tillman Sers, DO  Vitals:   02/21/18 1454  Weight: 248 lb (112.5 kg)    [name] Weekly seesion - 02/22/18 1400      Weekly Session   Topic Discussed  Stress management and problem solving      Fun things you did since last meeting:" not much"  Things you are grateful for:"everything" Nutrition celebrations:"tried to cook fish, baked fish" Barriers:"stress"  Rose Fillers 02/22/2018, 2:54 PM

## 2018-02-28 ENCOUNTER — Other Ambulatory Visit (HOSPITAL_COMMUNITY): Payer: Self-pay | Admitting: Student

## 2018-03-08 NOTE — Progress Notes (Signed)
[facility] PREP Weekly Session   Patient Details  Name: Kenneth Robertson MRN: [id number] Date of Birth: 1980/08/22 Age: 37 y.o. PCP: Tillman Sers, DO  Vitals:   03/07/18 1457  Weight: 264 lb (119.7 kg)    [name] Weekly seesion - 03/08/18 1400      Weekly Session   Topic Discussed  Importance of resistance training      Fun things you did since last meeting:"spent time w/family" Things you are grateful for:"family, life, being able to give back"    Rose Fillers 03/08/2018, 2:58 PM

## 2018-03-16 ENCOUNTER — Other Ambulatory Visit (HOSPITAL_COMMUNITY): Payer: Self-pay | Admitting: Cardiology

## 2018-03-19 ENCOUNTER — Other Ambulatory Visit (HOSPITAL_COMMUNITY): Payer: Self-pay | Admitting: Cardiology

## 2018-04-10 DIAGNOSIS — H401133 Primary open-angle glaucoma, bilateral, severe stage: Secondary | ICD-10-CM | POA: Diagnosis not present

## 2018-04-10 DIAGNOSIS — Z0100 Encounter for examination of eyes and vision without abnormal findings: Secondary | ICD-10-CM | POA: Diagnosis not present

## 2018-04-11 ENCOUNTER — Encounter (HOSPITAL_COMMUNITY): Payer: Self-pay | Admitting: Cardiology

## 2018-04-11 ENCOUNTER — Ambulatory Visit (HOSPITAL_COMMUNITY)
Admission: RE | Admit: 2018-04-11 | Discharge: 2018-04-11 | Disposition: A | Discharge status: Home / Self Care | Payer: Medicare Other | Source: Ambulatory Visit | Attending: Cardiology | Admitting: Cardiology

## 2018-04-11 VITALS — BP 120/88 | HR 83 | Wt 267.4 lb

## 2018-04-11 DIAGNOSIS — I110 Hypertensive heart disease with heart failure: Secondary | ICD-10-CM | POA: Insufficient documentation

## 2018-04-11 DIAGNOSIS — I513 Intracardiac thrombosis, not elsewhere classified: Secondary | ICD-10-CM

## 2018-04-11 DIAGNOSIS — Z79899 Other long term (current) drug therapy: Secondary | ICD-10-CM | POA: Diagnosis not present

## 2018-04-11 DIAGNOSIS — Z7901 Long term (current) use of anticoagulants: Secondary | ICD-10-CM | POA: Insufficient documentation

## 2018-04-11 DIAGNOSIS — I5022 Chronic systolic (congestive) heart failure: Secondary | ICD-10-CM

## 2018-04-11 DIAGNOSIS — I428 Other cardiomyopathies: Secondary | ICD-10-CM | POA: Diagnosis not present

## 2018-04-11 DIAGNOSIS — Z8249 Family history of ischemic heart disease and other diseases of the circulatory system: Secondary | ICD-10-CM | POA: Insufficient documentation

## 2018-04-11 DIAGNOSIS — I240 Acute coronary thrombosis not resulting in myocardial infarction: Secondary | ICD-10-CM

## 2018-04-11 LAB — BASIC METABOLIC PANEL
ANION GAP: 8 (ref 5–15)
BUN: 12 mg/dL (ref 6–20)
CALCIUM: 9.1 mg/dL (ref 8.9–10.3)
CHLORIDE: 104 mmol/L (ref 98–111)
CO2: 23 mmol/L (ref 22–32)
Creatinine, Ser: 1.26 mg/dL — ABNORMAL HIGH (ref 0.61–1.24)
Glucose, Bld: 127 mg/dL — ABNORMAL HIGH (ref 70–99)
POTASSIUM: 3.8 mmol/L (ref 3.5–5.1)
Sodium: 135 mmol/L (ref 135–145)

## 2018-04-11 LAB — DIGOXIN LEVEL: DIGOXIN LVL: 0.4 ng/mL — AB (ref 0.8–2.0)

## 2018-04-11 MED ORDER — SPIRONOLACTONE 25 MG PO TABS: 25.0000 mg | ORAL_TABLET | Freq: Every day | ORAL | 6 refills | Status: DC

## 2018-04-11 MED ORDER — CARVEDILOL 6.25 MG PO TABS: 6.2500 mg | ORAL_TABLET | Freq: Two times a day (BID) | ORAL | 6 refills | Status: DC

## 2018-04-11 NOTE — Patient Instructions (Signed)
INCREASE Coreg to 6.25mg  (1 tab) twice a day  INCREASE Spironolactone to 25mg  (1 tab) daily  Labs today We will only contact you if something comes back abnormal or we need to make some changes. Otherwise no news is good news!  PLEASE REPEAT LABS IN 10 DAYS  You have been referred for sleep study. They will contact you for this study.   Your physician recommends that you schedule a follow-up appointment in: 2 months with an ECHO  Your physician has requested that you have an echocardiogram. Echocardiography is a painless test that uses sound waves to create images of your heart. It provides your doctor with information about the size and shape of your heart and how well your heart's chambers and valves are working. This procedure takes approximately one hour. There are no restrictions for this procedure.

## 2018-04-11 NOTE — Progress Notes (Signed)
CSW met with patient briefly and shared about the [facility] men's Support Group tomorrow at 10am in the HF Clinic. Patient very receptive and hopes to join the group. CSW will continue to be available as needed. Raquel Sarna, [place], [place]

## 2018-04-12 NOTE — Progress Notes (Signed)
Advanced Heart Failure Clinic Note   PCP: Dr. Leonides Schanz Cardiology: Dr. [name]  38 y.o. with history of HTN and nonischemic cardiomyopathy presents for followup of CHF.  He was admitted in 12/17 after 1 month of worsening dyspnea.  He was volume overloaded on exam with EF 10-15% on echo.  He was diuresed and RHC/LHC done.  This showed no CAD. Cardiac output was low.  Cardiac MRI in 4/18 showed severe LV dilation, EF 24%, prominent trabeculation (possible noncompaction), RV insertion LGE (nonspecific).    Last echo was in 5/19.  The LV was severely dilated with EF 25-30%.  CPX done in 7/19 surprisingly did not show a significant CHF limitation.   Weight is up today, he attributes this to eating over the holidays.  He has been doing the [employer] class and feels like he has done well with the exercise.  No dyspnea walking on flat ground.  Mild dyspnea with stairs but improved. No orthopnea/PND. He is taking all his meds.    Labs (1/18): K 4.3, creatinine 1.47, BNP 243, TSH normal, HIV negative, SPEP negative Labs (4/18): BNP 390, hgb 14.4, K 4.3, creatinine 1.33 Labs (7/18): K 4.3, creatinine 1.4 Labs (2/19): K 3.9, creatinine 1.43, digoxin < 0.2 Labs (5/19): K 4, creatinine 1.21, hgb 15.7, digoxin 0.5 Labs (7/19): HIV negative, digoxin 0.3 Labs (8/19): K 4, creatinine 1.3 Labs (9/19): K 4.1, creatinine 1.33  PMH: 1. HTN 2. Depression 3. Chronic systolic CHF: Nonischemic cardiomyopathy, diagnosed in 12/17.  - Echo (12/17) with EF 10-15%, LV thrombus, moderate MR, PASP 47 mmHg, moderately dilated and dysfunctional RV.  - Echo (7/18) with EF 15%, moderate MR. PASP 40 mm Hg.  - LHC/RHC (12/17): No CAD; mean RA 8, PA 41/22, mean PCWP 20, CI 1.7.  - HIV, SPEP, TSH negative.  - Cardiac MRI in 4/18 showed severe LV dilation, EF 24%, prominent trabeculation (possible noncompaction), RV insertion LGE (nonspecific), no LV thrombus.   - Echo (7/18): EF 15%, severe LV dilation with diffuse HK, grade 3  diastolic dysfunction, mild-moderate MR, PASP 40 mmHg.  - Echo (5/19): EF 25-30%, severe LV dilation with diffuse hypokinesis, mild MR, mild RV dilation with normal systolic function.  - CPX (7/19): RER 1.08, peak VO2 30.2, VE/VCO2 slope 31 => no obvious cardiac limitation.  4. LV thrombus: On warfarin.   SH: On disability, nonsmoker, no ETOH/drugs.  FH: Mother with CHF but developed later in life (around 66).  No sudden cardiac death.   Review of systems complete and found to be negative unless listed in HPI.    Current Outpatient Medications  Medication Sig Dispense Refill  . carvedilol (COREG) 6.25 MG tablet Take 1 tablet (6.25 mg total) by mouth 2 (two) times daily. 60 tablet 6  . digoxin (LANOXIN) 0.125 MG tablet TAKE 1 TABLET (125 MCG TOTAL) DAILY BY MOUTH. 90 tablet 3  . furosemide (LASIX) 40 MG tablet Take 2 tablets (80 mg total) by mouth daily. 30 tablet 6  . hydrALAZINE (APRESOLINE) 25 MG tablet TAKE 1 TABLET BY MOUTH THREE TIMES A DAY 270 tablet 1  . isosorbide mononitrate (IMDUR) 30 MG 24 hr tablet Take 1 tablet (30 mg total) by mouth daily. 30 tablet 5  . KLOR-CON M20 20 MEQ tablet TAKE 1 TABLET (20 MEQ TOTAL) DAILY BY MOUTH. 90 tablet 1  . sacubitril-valsartan (ENTRESTO) 24-26 MG Take 1 tablet by mouth 2 (two) times daily. 60 tablet 6  . spironolactone (ALDACTONE) 25 MG tablet Take 1 tablet (  25 mg total) by mouth daily. 30 tablet 6  . warfarin (COUMADIN) 5 MG tablet TAKE AS DIRECTED BY COUMADIN CLINIC 180 tablet 1   No current facility-administered medications for this encounter.    BP 120/88   Pulse 83   Wt 121.3 kg (267 lb 6.4 oz)   SpO2 98%   BMI 37.29 kg/m    Wt Readings from Last 3 Encounters:  04/11/18 121.3 kg (267 lb 6.4 oz)  03/07/18 119.7 kg (264 lb)  02/21/18 112.5 kg (248 lb)    Physical Exam General: NAD Neck: No JVD, no thyromegaly or thyroid nodule.  Lungs: Clear to auscultation bilaterally with normal respiratory effort. CV: Nondisplaced PMI.   Heart regular S1/S2, no S3/S4, no murmur.  No peripheral edema.  No carotid bruit.  Normal pedal pulses.  Abdomen: Soft, nontender, no hepatosplenomegaly, no distention.  Skin: Intact without lesions or rashes.  Neurologic: Alert and oriented x 3.  Psych: Normal affect. Extremities: No clubbing or cyanosis.  HEENT: Normal.   Assessment/Plan: 1. Chronic systolic CHF: Nonischemic cardiomyopathy.  EF 10-15% by echo in 12/17.  No coronary disease and low output on cath.  HIV negative, SPEP negative.  No family history of early cardiomyopathy (mother with CHF around 95).  No ETOH/drugs. Possible viral myocarditis.  Cardiac MRI in 4/18 with severe LV dilation, EF 24%, RV insertion site LGE (nonspecific), cannot rule out noncompaction.  Echo in 5/19 showed EF 25-30% with severe LV dilation.  CPX in 7/19 was quite good with minimal cardiac limitation.  NYHA class II symptoms.  He is not volume overloaded on exam. He has been doing better about taking his medications.  - Continue Entresto 24/26 bid.  - Increase Coreg to 6.25 mg bid.   - Continue digoxin 0.125 and check level today. - Increase spironolactone 25 mg daily.  BMET today and again in 10 days.  - Continue current hydralazine/Imdur.  - With persistently low EF and young age, I have recommended ICD despite nonischemic etiology.  He is not a CRT candidate with relatively narrow QRS. He does not want appointment with EP until we've done one more echo.  I will arrange for echo and followup in 2 months.   If EF remains low, will again encourage ICD.  2. LV thrombus: Not seen on cMRI in 4/18 or echo in 5/19.   - Continue warfarin for anticoagulation  3. ?OSA: Strong clinical suspicion for OSA.  Will see if we can get a home sleep study for him.    Followup in 2 months with echo.   Marca Ancona, MD  04/12/2018

## 2018-04-16 ENCOUNTER — Other Ambulatory Visit: Payer: Self-pay | Admitting: Family Medicine

## 2018-04-20 ENCOUNTER — Other Ambulatory Visit: Payer: Medicare Other

## 2018-04-20 ENCOUNTER — Other Ambulatory Visit (INDEPENDENT_AMBULATORY_CARE_PROVIDER_SITE_OTHER): Payer: Medicare Other | Admitting: *Deleted

## 2018-04-20 DIAGNOSIS — Z7901 Long term (current) use of anticoagulants: Secondary | ICD-10-CM | POA: Diagnosis not present

## 2018-04-20 DIAGNOSIS — I513 Intracardiac thrombosis, not elsewhere classified: Secondary | ICD-10-CM

## 2018-04-20 LAB — POCT INR: INR: 2.8 (ref 2.0–3.0)

## 2018-04-21 ENCOUNTER — Ambulatory Visit (INDEPENDENT_AMBULATORY_CARE_PROVIDER_SITE_OTHER): Payer: Medicare Other | Admitting: Family Medicine

## 2018-04-21 ENCOUNTER — Encounter: Payer: Self-pay | Admitting: Family Medicine

## 2018-04-21 ENCOUNTER — Other Ambulatory Visit (HOSPITAL_COMMUNITY): Payer: Medicare Other

## 2018-04-21 ENCOUNTER — Other Ambulatory Visit (HOSPITAL_COMMUNITY)
Admission: RE | Admit: 2018-04-21 | Discharge: 2018-04-21 | Disposition: A | Discharge status: Home / Self Care | Payer: Medicare Other | Source: Ambulatory Visit | Attending: Family Medicine | Admitting: Family Medicine

## 2018-04-21 DIAGNOSIS — Z206 Contact with and (suspected) exposure to human immunodeficiency virus [HIV]: Secondary | ICD-10-CM | POA: Diagnosis not present

## 2018-04-21 DIAGNOSIS — Z202 Contact with and (suspected) exposure to infections with a predominantly sexual mode of transmission: Secondary | ICD-10-CM

## 2018-04-21 DIAGNOSIS — Z114 Encounter for screening for human immunodeficiency virus [HIV]: Secondary | ICD-10-CM | POA: Diagnosis not present

## 2018-04-21 DIAGNOSIS — Z21 Asymptomatic human immunodeficiency virus [HIV] infection status: Secondary | ICD-10-CM | POA: Diagnosis not present

## 2018-04-21 DIAGNOSIS — Z23 Encounter for immunization: Secondary | ICD-10-CM

## 2018-04-21 DIAGNOSIS — Z7189 Other specified counseling: Secondary | ICD-10-CM

## 2018-04-21 DIAGNOSIS — Z7185 Encounter for immunization safety counseling: Secondary | ICD-10-CM

## 2018-04-21 LAB — POCT URINALYSIS DIP (MANUAL ENTRY)
Bilirubin, UA: NEGATIVE
Blood, UA: NEGATIVE
GLUCOSE UA: NEGATIVE mg/dL
Ketones, POC UA: NEGATIVE mg/dL
Leukocytes, UA: NEGATIVE
Nitrite, UA: NEGATIVE
Protein Ur, POC: NEGATIVE mg/dL
Spec Grav, UA: 1.025 (ref 1.010–1.025)
Urobilinogen, UA: 0.2 E.U./dL
pH, UA: 6 (ref 5.0–8.0)

## 2018-04-21 MED ORDER — TETANUS-DIPHTH-ACELL PERTUSSIS 5-2.5-18.5 LF-MCG/0.5 IM SUSP: 0.5000 mL | Freq: Once | INTRAMUSCULAR | 0 refills | Status: DC
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
## 2018-04-22 LAB — RPR: RPR: NONREACTIVE

## 2018-04-22 LAB — HIV ANTIBODY (ROUTINE TESTING W REFLEX): HIV Screen 4th Generation wRfx: NONREACTIVE

## 2018-04-24 LAB — URINE CYTOLOGY ANCILLARY ONLY
Chlamydia: NEGATIVE
Neisseria Gonorrhea: NEGATIVE
Trichomonas: NEGATIVE

## 2018-04-25 NOTE — Progress Notes (Signed)
Please call pt and inform that test results are negative.

## 2018-04-30 ENCOUNTER — Other Ambulatory Visit (HOSPITAL_COMMUNITY): Payer: Self-pay | Admitting: Cardiology

## 2018-05-09 ENCOUNTER — Ambulatory Visit (HOSPITAL_BASED_OUTPATIENT_CLINIC_OR_DEPARTMENT_OTHER): Payer: Medicare Other

## 2018-05-12 ENCOUNTER — Encounter (HOSPITAL_BASED_OUTPATIENT_CLINIC_OR_DEPARTMENT_OTHER): Payer: Medicare Other

## 2018-05-17 ENCOUNTER — Ambulatory Visit: Payer: Medicare Other | Admitting: Family Medicine

## 2018-06-06 ENCOUNTER — Other Ambulatory Visit: Payer: Self-pay | Admitting: Family Medicine

## 2018-06-06 ENCOUNTER — Ambulatory Visit (INDEPENDENT_AMBULATORY_CARE_PROVIDER_SITE_OTHER): Payer: Medicare Other | Admitting: *Deleted

## 2018-06-06 DIAGNOSIS — Z7901 Long term (current) use of anticoagulants: Secondary | ICD-10-CM | POA: Diagnosis not present

## 2018-06-06 DIAGNOSIS — I513 Intracardiac thrombosis, not elsewhere classified: Secondary | ICD-10-CM

## 2018-06-06 LAB — POCT INR: INR: 1.8 — AB (ref 2.0–3.0)

## 2018-06-06 MED ORDER — WARFARIN SODIUM 5 MG PO TABS: ORAL_TABLET | ORAL | 1 refills | Status: DC

## 2018-06-13 ENCOUNTER — Ambulatory Visit (HOSPITAL_COMMUNITY): Payer: Medicare Other

## 2018-06-13 ENCOUNTER — Encounter (HOSPITAL_COMMUNITY): Payer: Medicare Other | Admitting: Cardiology

## 2018-06-23 ENCOUNTER — Other Ambulatory Visit (HOSPITAL_COMMUNITY): Payer: Self-pay | Admitting: *Deleted

## 2018-06-26 ENCOUNTER — Encounter (HOSPITAL_COMMUNITY): Payer: Self-pay

## 2018-06-28 ENCOUNTER — Other Ambulatory Visit: Payer: Self-pay

## 2018-06-28 ENCOUNTER — Ambulatory Visit (HOSPITAL_COMMUNITY)
Admission: RE | Admit: 2018-06-28 | Discharge: 2018-06-28 | Disposition: A | Discharge status: Home / Self Care | Payer: Medicare Other | Source: Ambulatory Visit | Attending: Cardiology | Admitting: Cardiology

## 2018-06-28 DIAGNOSIS — I513 Intracardiac thrombosis, not elsewhere classified: Secondary | ICD-10-CM

## 2018-06-28 DIAGNOSIS — I5022 Chronic systolic (congestive) heart failure: Secondary | ICD-10-CM | POA: Diagnosis not present

## 2018-06-28 DIAGNOSIS — I240 Acute coronary thrombosis not resulting in myocardial infarction: Secondary | ICD-10-CM

## 2018-06-28 MED ORDER — SPIRONOLACTONE 25 MG PO TABS: 12.5000 mg | ORAL_TABLET | Freq: Every day | ORAL | 1 refills | Status: DC

## 2018-06-28 MED ORDER — ISOSORBIDE MONONITRATE ER 30 MG PO TB24: 30.0000 mg | ORAL_TABLET | Freq: Every day | ORAL | 5 refills | Status: DC

## 2018-06-28 MED ORDER — DIGOXIN 125 MCG PO TABS: 125.0000 ug | ORAL_TABLET | Freq: Every day | ORAL | 3 refills | Status: DC

## 2018-06-28 MED ORDER — CARVEDILOL 6.25 MG PO TABS: 6.2500 mg | ORAL_TABLET | Freq: Two times a day (BID) | ORAL | 6 refills | Status: DC

## 2018-06-28 NOTE — Progress Notes (Signed)
Heart Failure TeleHealth Note  Due to national recommendations of social distancing due to COVID 19, Audio/video telehealth visit is felt to be most appropriate for this patient at this time.  See MyChart message from today for patient consent regarding telehealth for [name].  Date:  06/28/2018   ID:  Kenneth Robertson, DOB [birth year], MRN [id number]  Location: Home  Provider location: [address], [place] Type of Visit: Established patient  PCP:  Default, Provider, MD  Cardiologist:  No primary care provider on file. Primary HF: Dr. [name]  Chief Complaint: Shortness of breath   History of Present Illness: Kenneth Robertson is a 38 y.o. male who presents via audio/video conferencing for a telehealth visit today.     Today,he denies symptoms of cough, fevers, chills, or new SOB worrisome for COVID 19.    38 y.o. with history of HTN and nonischemic cardiomyopathy presents for followup of CHF.  He was admitted in 12/17 after 1 month of worsening dyspnea.  He was volume overloaded on exam with EF 10-15% on echo.  He was diuresed and RHC/LHC done.  This showed no CAD. Cardiac output was low.  Cardiac MRI in 4/18 showed severe LV dilation, EF 24%, prominent trabeculation (possible noncompaction), RV insertion LGE (nonspecific).    Last echo was in 5/19.  The LV was severely dilated with EF 25-30%.  CPX done in 7/19 surprisingly did not show a significant CHF limitation.   Symptomatically, he feels like he is doing well.  No dyspnea walking on flat ground or up a flight of stairs.  No chest pain.  No orthopnea/PND.  He has noted several episodes where he will feel profoundly weak for a few seconds and then it resolves.  No loss of consciousness or palpitations.  He had a home sleep study but apparently did not sleep long enough for diagnosis (?).   He is currently out of Coreg, digoxin, and spironolactone.   Labs (1/18): K 4.3, creatinine 1.47, BNP 243, TSH normal, HIV  negative, SPEP negative Labs (4/18): BNP 390, hgb 14.4, K 4.3, creatinine 1.33 Labs (7/18): K 4.3, creatinine 1.4 Labs (2/19): K 3.9, creatinine 1.43, digoxin < 0.2 Labs (5/19): K 4, creatinine 1.21, hgb 15.7, digoxin 0.5 Labs (7/19): HIV negative, digoxin 0.3 Labs (8/19): K 4, creatinine 1.3 Labs (9/19): K 4.1, creatinine 1.33 Labs (1/20): HIV negative, K 3.8, creatinine 1.26, digoxin 0.4  PMH: 1. HTN 2. Depression 3. Chronic systolic CHF: Nonischemic cardiomyopathy, diagnosed in 12/17.  - Echo (12/17) with EF 10-15%, LV thrombus, moderate MR, PASP 47 mmHg, moderately dilated and dysfunctional RV.  - Echo (7/18) with EF 15%, moderate MR. PASP 40 mm Hg.  - LHC/RHC (12/17): No CAD; mean RA 8, PA 41/22, mean PCWP 20, CI 1.7.  - HIV, SPEP, TSH negative.  - Cardiac MRI in 4/18 showed severe LV dilation, EF 24%, prominent trabeculation (possible noncompaction), RV insertion LGE (nonspecific), no LV thrombus.   - Echo (7/18): EF 15%, severe LV dilation with diffuse HK, grade 3 diastolic dysfunction, mild-moderate MR, PASP 40 mmHg.  - Echo (5/19): EF 25-30%, severe LV dilation with diffuse hypokinesis, mild MR, mild RV dilation with normal systolic function.  - CPX (7/19): RER 1.08, peak VO2 30.2, VE/VCO2 slope 31 => no obvious cardiac limitation.  4. LV thrombus: On warfarin.   Past Surgical History:  Procedure Laterality Date  . CARDIAC CATHETERIZATION N/A 04/02/2016   Procedure: Right/Left Heart Cath and Coronary Angiography;  Surgeon: Laurey Morale, MD;  Location: [facility] INVASIVE CV LAB;  Service: Cardiovascular;  Laterality: N/A;  . EYE SURGERY     Left   . TOOTH EXTRACTION       Current Outpatient Medications  Medication Sig Dispense Refill  . carvedilol (COREG) 6.25 MG tablet Take 1 tablet (6.25 mg total) by mouth 2 (two) times daily. 60 tablet 6  . digoxin (LANOXIN) 0.125 MG tablet Take 1 tablet (125 mcg total) by mouth daily. 90 tablet 3  . furosemide (LASIX) 40 MG tablet Take  2 tablets (80 mg total) by mouth daily. 30 tablet 6  . hydrALAZINE (APRESOLINE) 25 MG tablet TAKE 1 TABLET BY MOUTH THREE TIMES A DAY 270 tablet 1  . isosorbide mononitrate (IMDUR) 30 MG 24 hr tablet Take 1 tablet (30 mg total) by mouth daily. 30 tablet 5  . KLOR-CON M20 20 MEQ tablet TAKE 1 TABLET (20 MEQ TOTAL) DAILY BY MOUTH. 90 tablet 1  . sacubitril-valsartan (ENTRESTO) 24-26 MG Take 1 tablet by mouth 2 (two) times daily. 60 tablet 6  . spironolactone (ALDACTONE) 25 MG tablet Take 0.5 tablets (12.5 mg total) by mouth daily. 45 tablet 1  . warfarin (COUMADIN) 5 MG tablet Take by mouth as directed by coumadin clinic 180 tablet 1   No current facility-administered medications for this encounter.     Allergies:   Hydrocodone   Social History:  The patient  reports that he has never smoked. He has never used smokeless tobacco. He reports that he does not drink alcohol or use drugs.   Family History:  The patient's family history includes Cancer in his father; Heart failure in his mother.   ROS:  Please see the history of present illness.   All other systems are personally reviewed and negative.   Exam:  (Video/Tele Health Call; Exam is subjective and or/visual.) General:  Well appearing. No resp difficulty. HEENT: Normal Lungs: Normal respiratory effort with conversation.  Abdomen: Non-distended. Pt denies tenderness with self palpation.  Extremities: Pt denies edema. Neuro: Alert & oriented x 3.   Recent Labs: 08/25/2017: Hemoglobin 15.7; Platelets 311 04/11/2018: BUN 12; Creatinine, Ser 1.26; Potassium 3.8; Sodium 135  Personally reviewed   Wt Readings from Last 3 Encounters:  05/09/18 120.2 kg (265 lb)  04/11/18 121.3 kg (267 lb 6.4 oz)  03/07/18 119.7 kg (264 lb)     ASSESSMENT AND PLAN:  1. Chronic systolic CHF: Nonischemic cardiomyopathy.  EF 10-15% by echo in 12/17.  No coronary disease and low output on cath.  HIV negative, SPEP negative.  No family history of early  cardiomyopathy (mother with CHF around 17).  No ETOH/drugs. Possible viral myocarditis.  Cardiac MRI in 4/18 with severe LV dilation, EF 24%, RV insertion site LGE (nonspecific), cannot rule out noncompaction.  Echo in 5/19 showed EF 25-30% with severe LV dilation.  CPX in 7/19 was quite good with minimal cardiac limitation.  NYHA class II symptoms.  Weight is stable, no peripheral edema.  He continues to have trouble complying with his medication regimen.  - Continue Entresto 24/26 bid.  - Restart Coreg 6.25 mg bid.   - Restart digoxin 0.125 and arrange for digoxin level check with next labs in 10 days.  - Restart spironolactone 25 mg daily.  Arrange for BMET in 10 days.  - Continue current hydralazine/Imdur.  - With persistently low EF and young age, I have recommended ICD despite nonischemic etiology.  He is not a CRT candidate with relatively  narrow QRS. He does not want appointment with EP until we've done one more echo.  I will arrange for echo and followup in 6 wks.   If EF remains low, will again encourage ICD.  2. LV thrombus: Not seen on cMRI in 4/18 or echo in 5/19.   - Continue warfarin for anticoagulation  3. ?OSA: Strong clinical suspicion for OSA.  He did a home sleep study but no results in computer.  He said that he may not have slept long enough.  Will followup with Dr. Mayford Knife to see what happened.    COVID screen The patient does not have any symptoms that suggest any further testing/ screening at this time.  Social distancing reinforced today.  Recommended follow-up:  6 wks with echo.   Relevant cardiac medications were reviewed at length with the patient today.   The patient does not have concerns regarding their medications at this time.   Patient Risk: After full review of this patients clinical status, I feel that they are at moderate risk for cardiac decompensation at this time.  Today, I have spent 22 minutes with the patient with telehealth technology discussing CHF.     Signed, Marca Ancona, MD  06/28/2018 3:12 PM  [facility] [address] [facility] [place] [address] [phone] (office) [phone] (fax)

## 2018-06-28 NOTE — Patient Instructions (Addendum)
Follow up in 6 weeks with an echo.   Please call Dr.Turners office at [phone] regarding your sleep study.  Your provider requests you wear a 3 day zio patch. The company will contact you to mail you the device and further instructions.  Your provider requests you have lab work drawn. 07/05/2018 at 10:00am

## 2018-06-29 ENCOUNTER — Telehealth (HOSPITAL_COMMUNITY): Payer: Self-pay

## 2018-06-29 NOTE — Telephone Encounter (Signed)
Pt disability paperwork filled out. Still has sections that need to be filled out by patient. Paperwork will be at the front desk to be picked up by patient.

## 2018-06-30 NOTE — Addendum Note (Signed)
Encounter addended by: Wymon Swaney S, MD on: 06/30/2018 4:17 PM  Actions taken: Charge Capture section accepted

## 2018-07-05 ENCOUNTER — Other Ambulatory Visit (HOSPITAL_COMMUNITY): Payer: Medicare Other

## 2018-07-19 ENCOUNTER — Telehealth (HOSPITAL_COMMUNITY): Payer: Self-pay | Admitting: Licensed Clinical Social Worker

## 2018-07-19 NOTE — Telephone Encounter (Signed)
CSW contacted patient to check and make sure of adequate food and medications and remind of the importance to stay home and social distance when possible. CSW left message as no answer and encouraged patient to return call if needed. Jackie Sylvestre Rathgeber, LCSW, CCSW-MCS [phone] 

## 2018-07-21 ENCOUNTER — Telehealth (HOSPITAL_COMMUNITY): Payer: Self-pay | Admitting: Surgery

## 2018-07-21 NOTE — Telephone Encounter (Signed)
Patient returned call and informed me that he had not yet placed Zio Patch.  He says that he will place on Monday for 3 days as instructed and once complete mail back to company.  He is also asking about his disability paperwork.  Last note in chart indicates that he was to come by to pick up paperwork to complete.  He requests for me to mail to his home address which I will do.

## 2018-07-21 NOTE — Telephone Encounter (Signed)
I called and left message for Kenneth Robertson regarding his Zio Patch.  I requested that he call me to indicate of he received the patch, wore it and returned it as instructed.

## 2018-08-02 ENCOUNTER — Ambulatory Visit: Payer: Medicare Other

## 2018-08-02 ENCOUNTER — Ambulatory Visit (INDEPENDENT_AMBULATORY_CARE_PROVIDER_SITE_OTHER): Payer: Medicare Other

## 2018-08-02 DIAGNOSIS — I5022 Chronic systolic (congestive) heart failure: Secondary | ICD-10-CM | POA: Diagnosis not present

## 2018-08-02 DIAGNOSIS — I428 Other cardiomyopathies: Secondary | ICD-10-CM

## 2018-08-10 ENCOUNTER — Encounter (HOSPITAL_COMMUNITY): Payer: Medicare Other | Admitting: Cardiology

## 2018-08-10 ENCOUNTER — Other Ambulatory Visit (HOSPITAL_COMMUNITY): Payer: Medicare Other

## 2018-08-10 DIAGNOSIS — H401213 Low-tension glaucoma, right eye, severe stage: Secondary | ICD-10-CM | POA: Diagnosis not present

## 2018-08-11 DIAGNOSIS — I4940 Unspecified premature depolarization: Secondary | ICD-10-CM | POA: Diagnosis not present

## 2018-08-16 ENCOUNTER — Other Ambulatory Visit (HOSPITAL_COMMUNITY): Payer: Self-pay | Admitting: *Deleted

## 2018-08-16 DIAGNOSIS — I5022 Chronic systolic (congestive) heart failure: Secondary | ICD-10-CM

## 2018-08-22 IMAGING — CT CT ANGIO CHEST
2 of 6 series · 18 of 36 positions shown · IV contrast (Omni 300)
Comparison: None.

CLINICAL DATA: Shortness of breath and cough for 3 days.

EXAM:
CT ANGIOGRAPHY CHEST WITH CONTRAST
TECHNIQUE: Multidetector CT imaging of the chest was performed using the
standard protocol during bolus administration of intravenous
contrast. Multiplanar CT image reconstructions and MIPs were
obtained to evaluate the vascular anatomy.
CONTRAST:  100 cc Omni 300

[Series 6: pe thins · axial · 0.96mm/px · z∈[+1371,+1619]mm · 17 of 280 slices shown]
[im 16/280  lung]
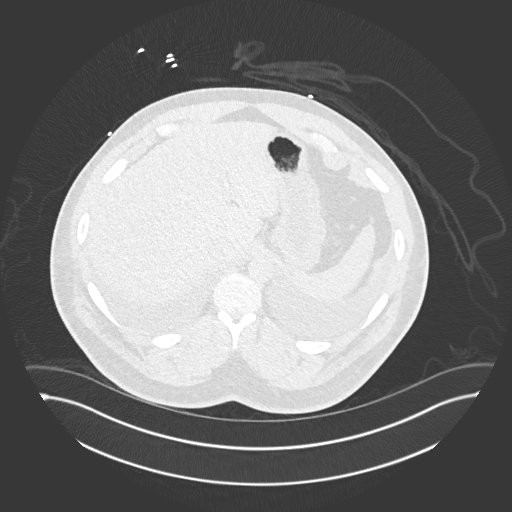
[im 32/280  mediastinal]
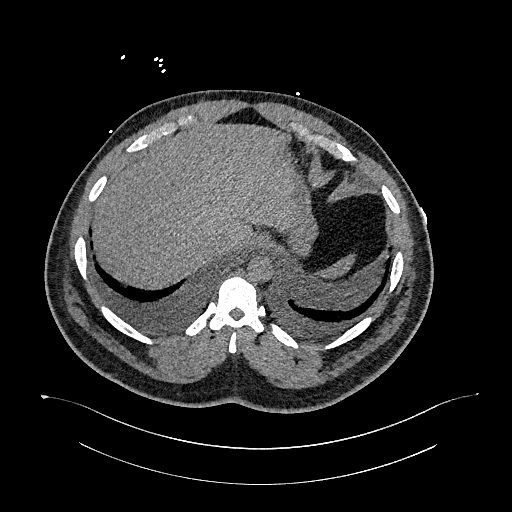
[im 47/280  lung]
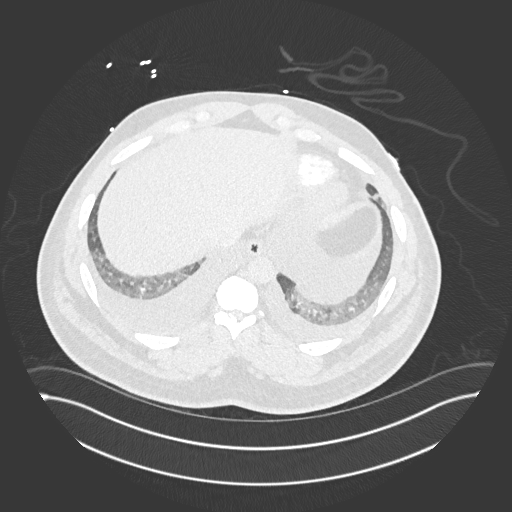
[im 63/280  mediastinal]
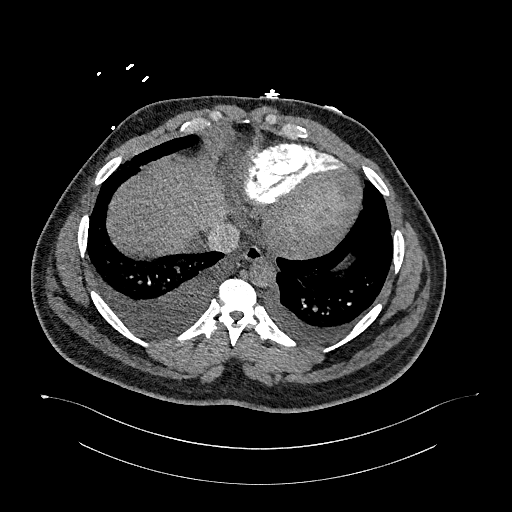
[im 78/280  lung]
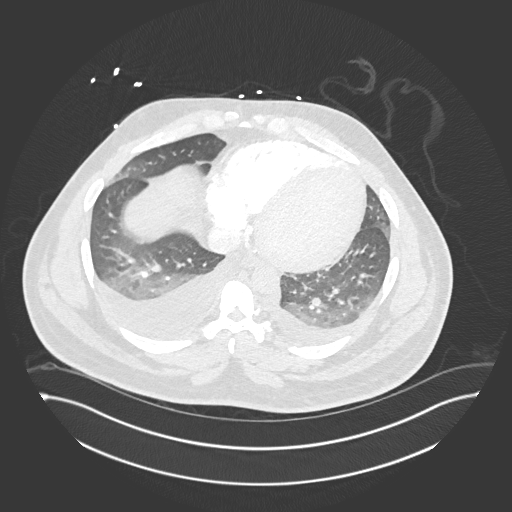
[im 94/280  mediastinal]
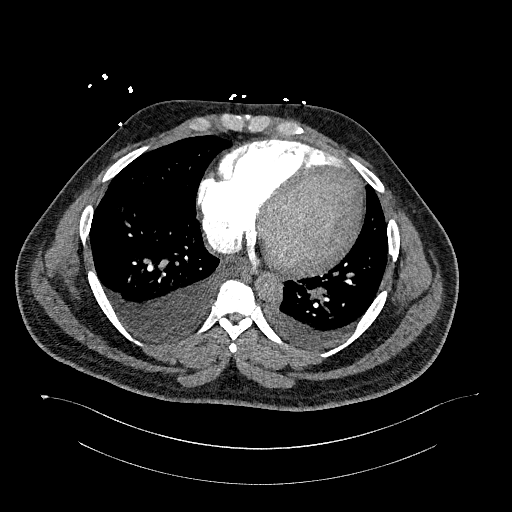
[im 109/280  lung]
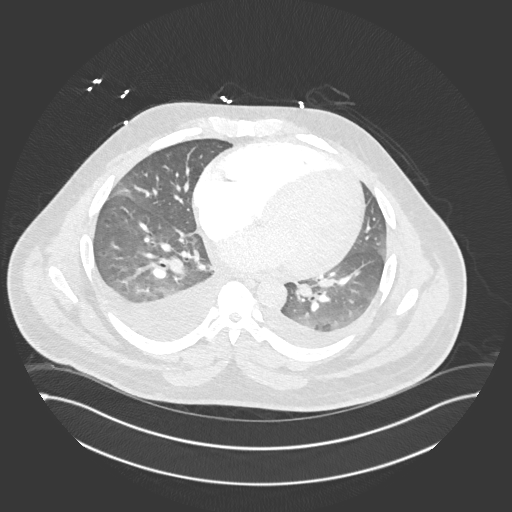
[im 125/280  mediastinal]
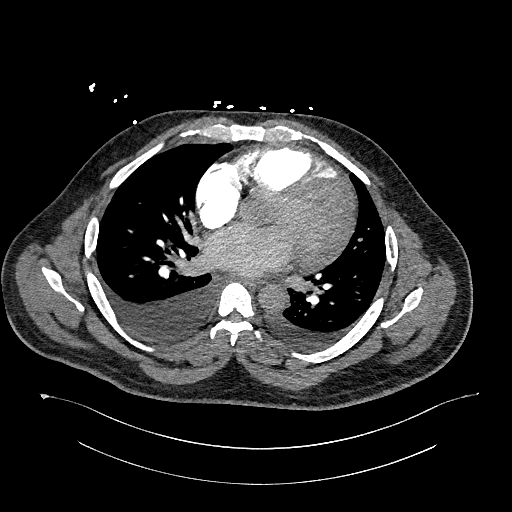
[im 140/280  lung]
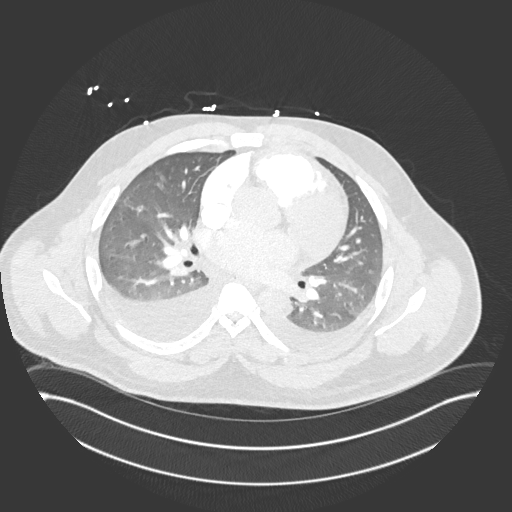
[im 156/280  mediastinal]
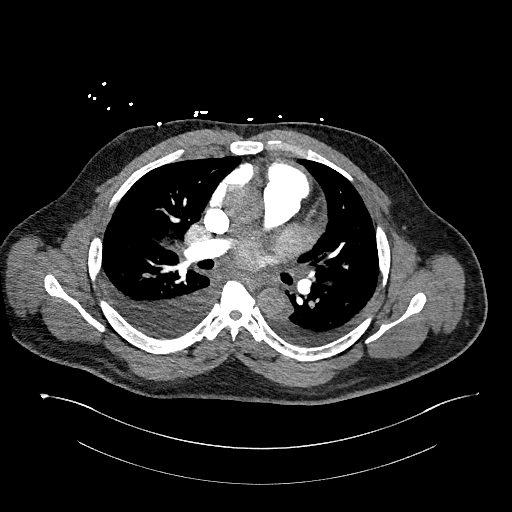
[im 171/280  lung]
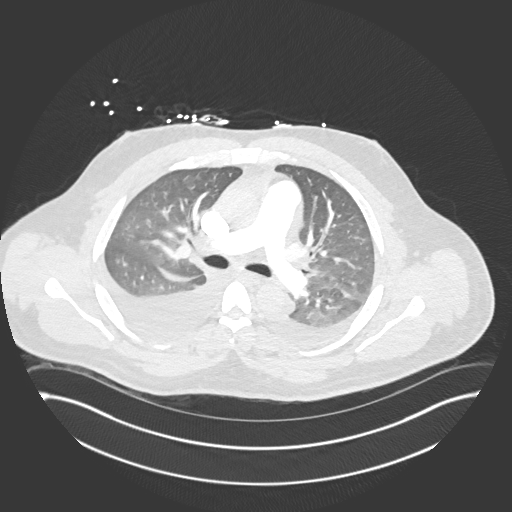
[im 187/280  mediastinal]
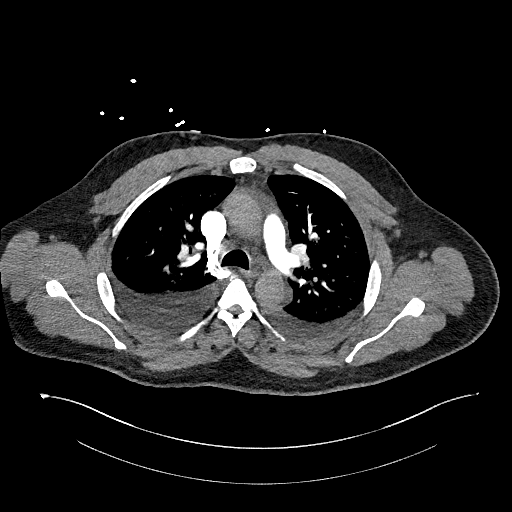
[im 202/280  lung]
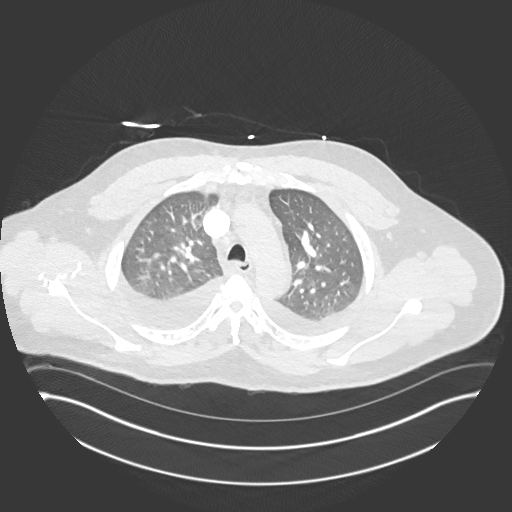
[im 218/280  mediastinal]
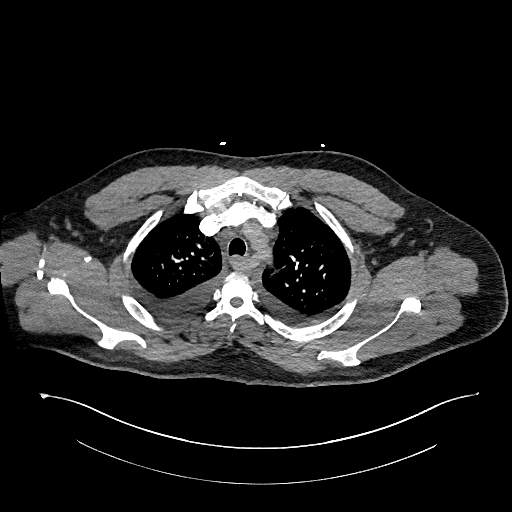
[im 233/280  lung]
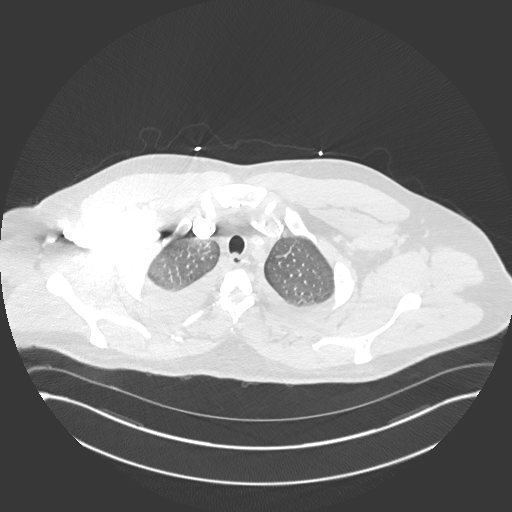
[im 249/280  mediastinal]
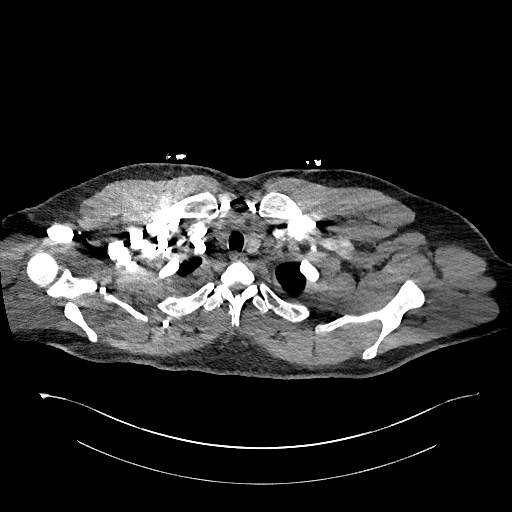
[im 264/280  lung]
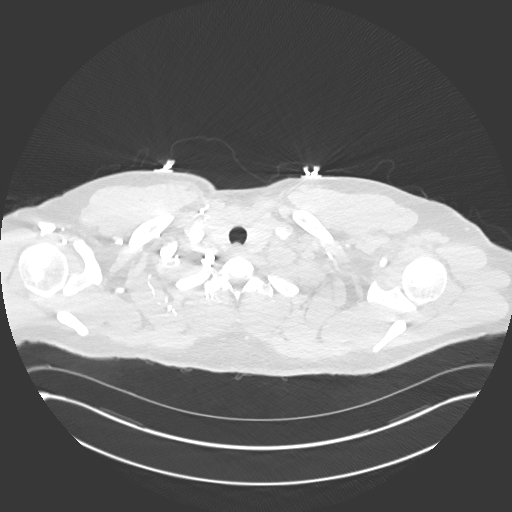

[Series 7: pe 2mm cor · coronal · 0.55mm/px · 1 of 156 slices shown]
[im 78/156  mediastinal]
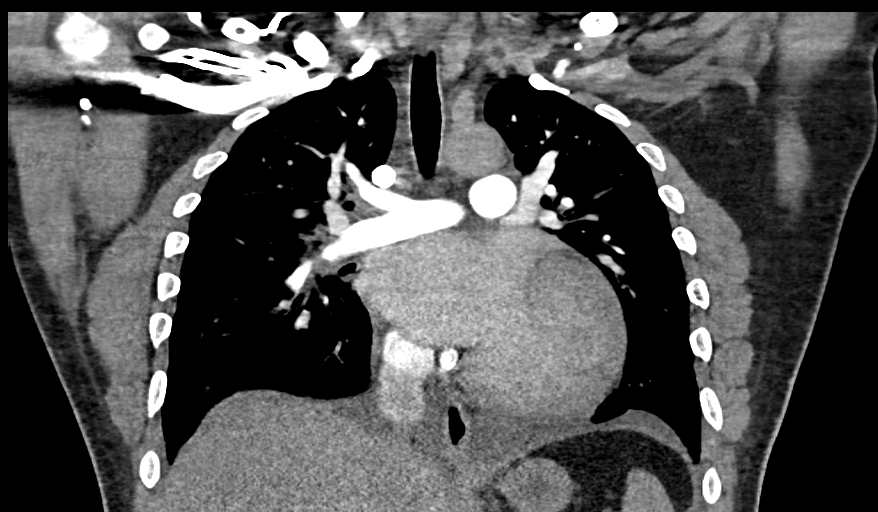

[18 of 36 positions shown; findings below may reference images not displayed]

FINDINGS: Cardiovascular: Mild cardiac enlargement. The main pulmonary artery
is patent. No saddle embolus. No lobar or segmental pulmonary artery
filling defects identified.

Mediastinum/Nodes: The trachea appears patent and is midline. Normal
appearance of the esophagus. Right paratracheal lymph node is
prominent measuring 11 mm, image 45 of series 4.

Lungs/Pleura: Bilateral pleural effusions are noted. Diffuse
ground-glass attenuation is noted in both lungs. Dependent changes
are noted within the posterior lower lobes. No airspace
consolidation.

Upper Abdomen: No acute abnormality.

Musculoskeletal: No aggressive lytic or sclerotic bone lesions
identified.

Review of the MIP images confirms the above findings.
IMPRESSION: 1. No evidence for acute pulmonary embolus.
2. Bilateral pleural effusions and diffuse ground-glass attenuation
compatible with a fluid overload state. Cannot rule out CHF.

## 2018-08-22 IMAGING — DX DG CHEST 2V
2 series · 2 of 2 positions shown · non-contrast
Comparison: 02/12/2013

CLINICAL DATA: Two-day history of right-sided chest pain and
nonproductive cough for 3 weeks.

EXAM:
CHEST  2 VIEW

[chest pa]
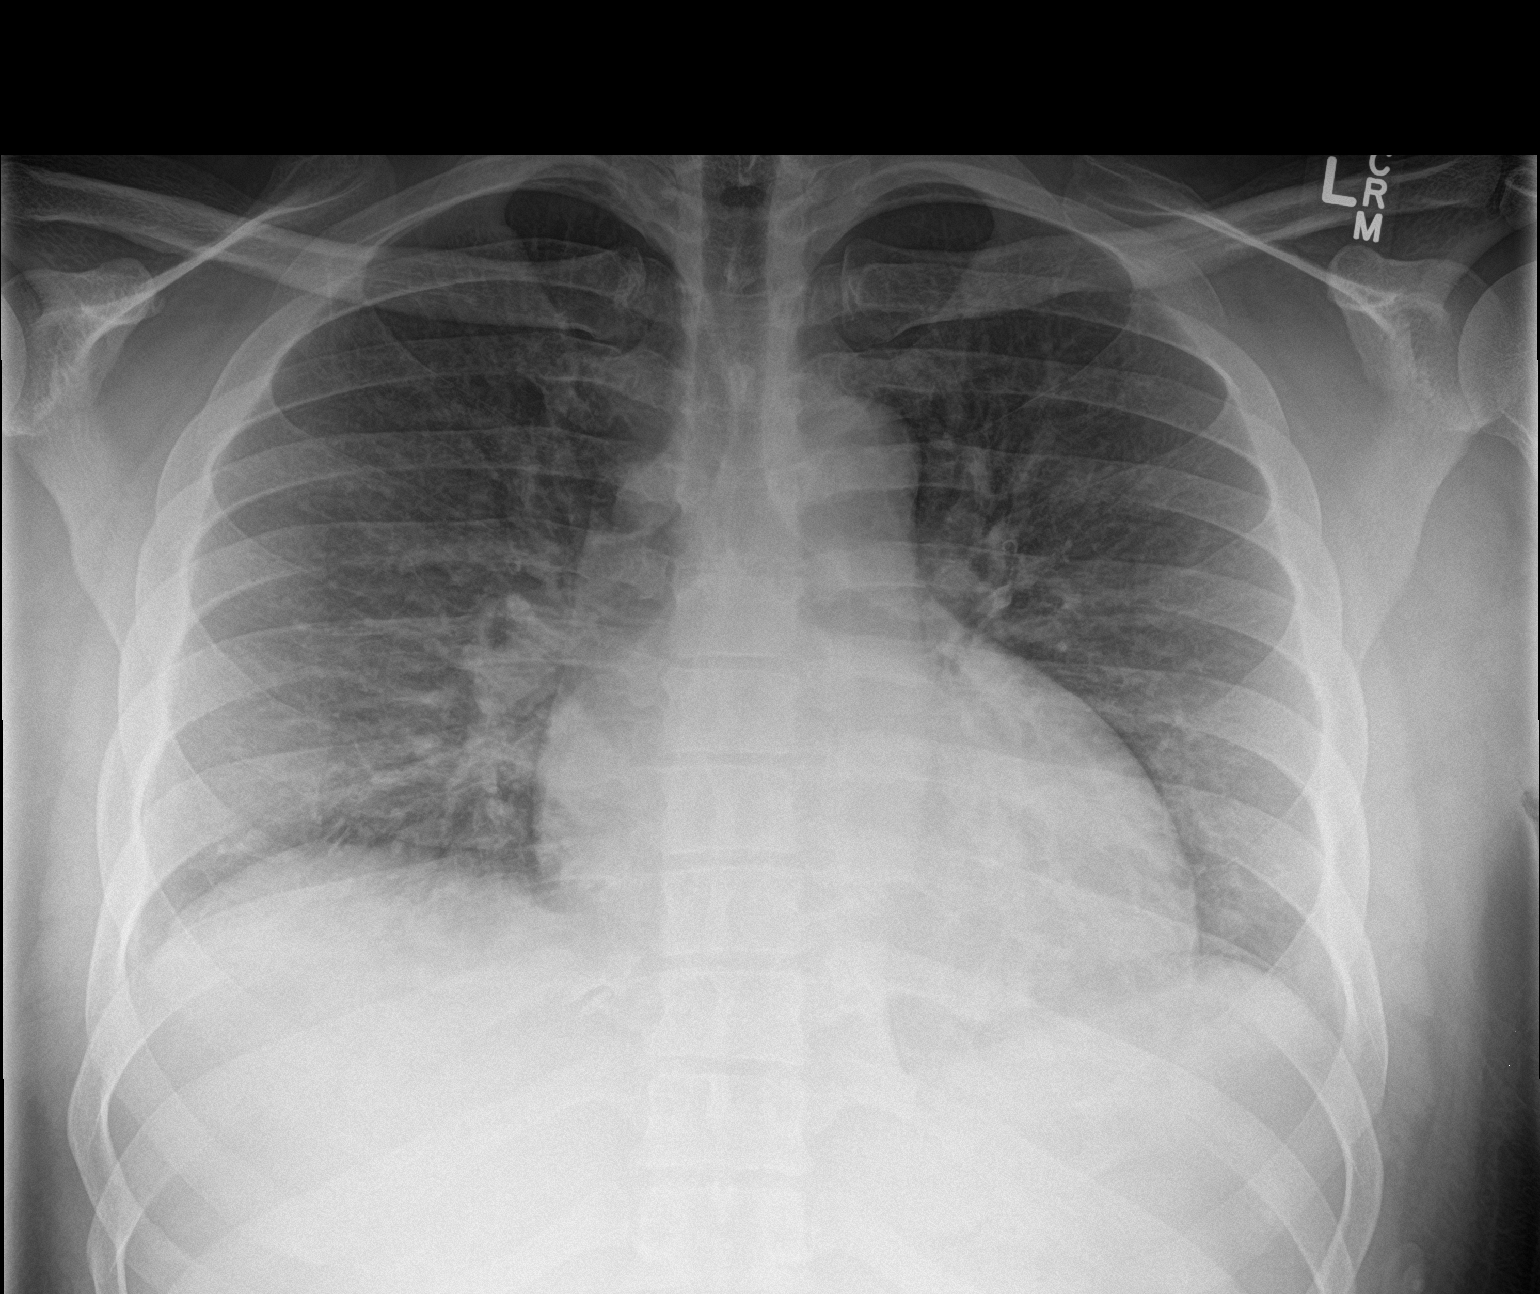

[chest lat]
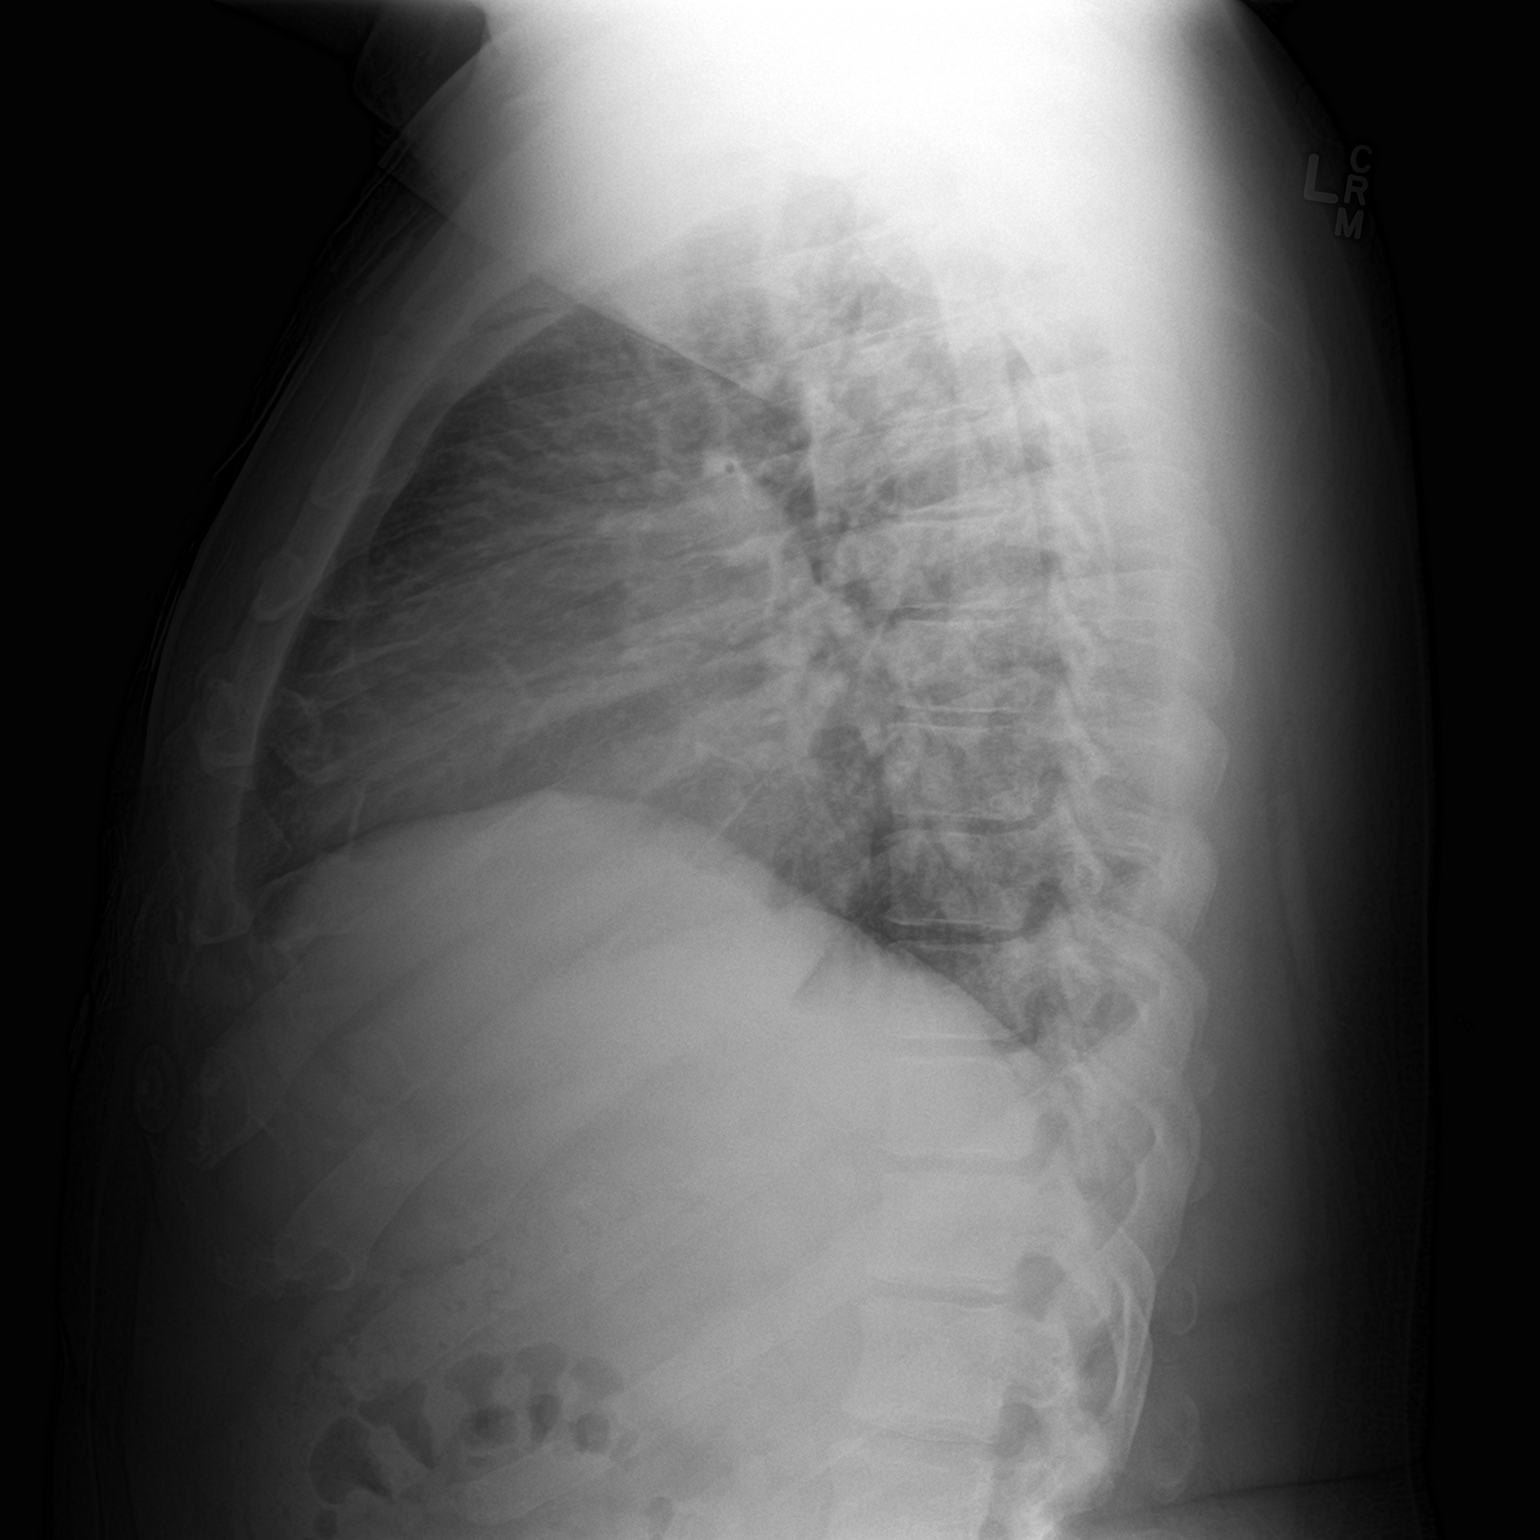

[2 of 2 positions shown; findings below may reference images not displayed]

FINDINGS: Cardiopericardial silhouette is upper normal to mildly enlarged.
Lung volumes are low. No pulmonary edema or pleural effusion. The
visualized bony structures of the thorax are intact.
IMPRESSION: Upper normal to mildly enlarged cardiopericardial silhouette.

Otherwise unremarkable.

## 2018-08-24 ENCOUNTER — Encounter (HOSPITAL_COMMUNITY): Payer: Self-pay

## 2018-08-24 ENCOUNTER — Emergency Department (HOSPITAL_COMMUNITY)
Admission: EM | Admit: 2018-08-24 | Discharge: 2018-08-24 | Disposition: A | Discharge status: Home / Self Care | Payer: Medicare Other | Attending: Emergency Medicine | Admitting: Emergency Medicine

## 2018-08-24 ENCOUNTER — Other Ambulatory Visit: Payer: Self-pay

## 2018-08-24 ENCOUNTER — Emergency Department (HOSPITAL_COMMUNITY): Payer: Medicare Other

## 2018-08-24 DIAGNOSIS — Z79899 Other long term (current) drug therapy: Secondary | ICD-10-CM | POA: Insufficient documentation

## 2018-08-24 DIAGNOSIS — Z7901 Long term (current) use of anticoagulants: Secondary | ICD-10-CM | POA: Diagnosis not present

## 2018-08-24 DIAGNOSIS — I5022 Chronic systolic (congestive) heart failure: Secondary | ICD-10-CM | POA: Diagnosis not present

## 2018-08-24 DIAGNOSIS — R079 Chest pain, unspecified: Secondary | ICD-10-CM | POA: Insufficient documentation

## 2018-08-24 DIAGNOSIS — M25512 Pain in left shoulder: Secondary | ICD-10-CM | POA: Diagnosis not present

## 2018-08-24 DIAGNOSIS — R0789 Other chest pain: Secondary | ICD-10-CM | POA: Diagnosis not present

## 2018-08-24 DIAGNOSIS — I110 Hypertensive heart disease with heart failure: Secondary | ICD-10-CM | POA: Insufficient documentation

## 2018-08-24 DIAGNOSIS — R0602 Shortness of breath: Secondary | ICD-10-CM | POA: Diagnosis not present

## 2018-08-24 LAB — CBC
HCT: 45 % (ref 39.0–52.0)
Hemoglobin: 15.7 g/dL (ref 13.0–17.0)
MCH: 28.6 pg (ref 26.0–34.0)
MCHC: 34.9 g/dL (ref 30.0–36.0)
MCV: 82.1 fL (ref 80.0–100.0)
Platelets: 287 10*3/uL (ref 150–400)
RBC: 5.48 MIL/uL (ref 4.22–5.81)
RDW: 13.2 % (ref 11.5–15.5)
WBC: 2.6 10*3/uL — ABNORMAL LOW (ref 4.0–10.5)
nRBC: 0 % (ref 0.0–0.2)

## 2018-08-24 LAB — BASIC METABOLIC PANEL
Anion gap: 10 (ref 5–15)
BUN: 17 mg/dL (ref 6–20)
CO2: 22 mmol/L (ref 22–32)
Calcium: 9.5 mg/dL (ref 8.9–10.3)
Chloride: 105 mmol/L (ref 98–111)
Creatinine, Ser: 1.22 mg/dL (ref 0.61–1.24)
GFR calc Af Amer: >60 mL/min (ref >60)
GFR calc non Af Amer: >60 mL/min (ref >60)
Glucose, Bld: 121 mg/dL — ABNORMAL HIGH (ref 70–99)
Potassium: 4.2 mmol/L (ref 3.5–5.1)
Sodium: 137 mmol/L (ref 135–145)

## 2018-08-24 LAB — PROTIME-INR
INR: 1.8 — ABNORMAL HIGH (ref 0.8–1.2)
Prothrombin Time: 20.6 seconds — ABNORMAL HIGH (ref 11.4–15.2)

## 2018-08-24 LAB — TROPONIN I: Troponin I: 0.03 ng/mL (ref <0.03)

## 2018-08-24 LAB — DIGOXIN LEVEL: Digoxin Level: 0.7 ng/mL — ABNORMAL LOW (ref 0.8–2.0)

## 2018-08-24 MED ORDER — SODIUM CHLORIDE 0.9% FLUSH: 3.0000 mL | Freq: Once | INTRAVENOUS | Status: DC

## 2018-08-24 NOTE — ED Triage Notes (Signed)
Pt arrives POV with c/o left chest pain last night about  11. Pt had not taken heart meds so took same and slept. Pain continues this am at 5/10.Pt denies shortness, sweating, nausea.
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
## 2018-08-24 NOTE — ED Notes (Signed)
Help get patient into a gown on the monitor patient is resting with call bell in reach 

## 2018-09-22 ENCOUNTER — Other Ambulatory Visit (HOSPITAL_COMMUNITY): Payer: Self-pay | Admitting: Cardiology

## 2018-09-26 ENCOUNTER — Other Ambulatory Visit: Payer: Self-pay

## 2018-09-26 ENCOUNTER — Ambulatory Visit (HOSPITAL_COMMUNITY)
Admission: RE | Admit: 2018-09-26 | Discharge: 2018-09-26 | Disposition: A | Discharge status: Home / Self Care | Payer: Medicare Other | Source: Ambulatory Visit | Attending: Cardiology | Admitting: Cardiology

## 2018-09-26 ENCOUNTER — Ambulatory Visit (HOSPITAL_BASED_OUTPATIENT_CLINIC_OR_DEPARTMENT_OTHER)
Admission: RE | Admit: 2018-09-26 | Discharge: 2018-09-26 | Disposition: A | Discharge status: Home / Self Care | Payer: Medicare Other | Source: Ambulatory Visit | Attending: Cardiology | Admitting: Cardiology

## 2018-09-26 ENCOUNTER — Encounter (HOSPITAL_COMMUNITY): Payer: Self-pay | Admitting: Cardiology

## 2018-09-26 VITALS — BP 118/88 | HR 65 | Wt 262.6 lb

## 2018-09-26 DIAGNOSIS — Z7901 Long term (current) use of anticoagulants: Secondary | ICD-10-CM | POA: Diagnosis not present

## 2018-09-26 DIAGNOSIS — I110 Hypertensive heart disease with heart failure: Secondary | ICD-10-CM | POA: Diagnosis not present

## 2018-09-26 DIAGNOSIS — Z79899 Other long term (current) drug therapy: Secondary | ICD-10-CM | POA: Diagnosis not present

## 2018-09-26 DIAGNOSIS — Z8249 Family history of ischemic heart disease and other diseases of the circulatory system: Secondary | ICD-10-CM | POA: Insufficient documentation

## 2018-09-26 DIAGNOSIS — I428 Other cardiomyopathies: Secondary | ICD-10-CM | POA: Diagnosis not present

## 2018-09-26 DIAGNOSIS — Z885 Allergy status to narcotic agent status: Secondary | ICD-10-CM | POA: Insufficient documentation

## 2018-09-26 DIAGNOSIS — I5022 Chronic systolic (congestive) heart failure: Secondary | ICD-10-CM | POA: Insufficient documentation

## 2018-09-26 DIAGNOSIS — R0602 Shortness of breath: Secondary | ICD-10-CM | POA: Diagnosis present

## 2018-09-26 LAB — BASIC METABOLIC PANEL
Anion gap: 7 (ref 5–15)
BUN: 10 mg/dL (ref 6–20)
CO2: 25 mmol/L (ref 22–32)
Calcium: 9 mg/dL (ref 8.9–10.3)
Chloride: 104 mmol/L (ref 98–111)
Creatinine, Ser: 1.38 mg/dL — ABNORMAL HIGH (ref 0.61–1.24)
GFR calc Af Amer: >60 mL/min (ref >60)
GFR calc non Af Amer: >60 mL/min (ref >60)
Glucose, Bld: 129 mg/dL — ABNORMAL HIGH (ref 70–99)
Potassium: 4.1 mmol/L (ref 3.5–5.1)
Sodium: 136 mmol/L (ref 135–145)

## 2018-09-26 LAB — DIGOXIN LEVEL: Digoxin Level: <0.2 ng/mL — ABNORMAL LOW (ref 0.8–2.0)

## 2018-09-26 MED ORDER — SACUBITRIL-VALSARTAN 49-51 MG PO TABS: 1.0000 | ORAL_TABLET | Freq: Two times a day (BID) | ORAL | 3 refills | Status: DC

## 2018-09-26 MED ORDER — SPIRONOLACTONE 25 MG PO TABS: 25.0000 mg | ORAL_TABLET | Freq: Every day | ORAL | 3 refills | Status: DC

## 2018-09-26 NOTE — Progress Notes (Signed)
Date:  09/26/2018   ID:  Kenneth Robertson, DOB [birth year], MRN [id number]  Location: Home  Provider location: [address], [place] Type of Visit: Established patient  PCP:  Pa, [facility]  Cardiologist:  No primary care provider on file. Primary HF: Dr. Shirlee LatchMcLean  Chief Complaint: Shortness of breath   History of Present Illness: Kenneth Robertson is a 38 y.o. male  with history of HTN and nonischemic cardiomyopathy who presents for followup of CHF.  He was admitted in 12/17 after 1 month of worsening dyspnea.  He was volume overloaded on exam with EF 10-15% on echo.  He was diuresed and RHC/LHC done.  This showed no CAD. Cardiac output was low.  Cardiac MRI in 4/18 showed severe LV dilation, EF 24%, prominent trabeculation (possible noncompaction), RV insertion LGE (nonspecific).    Echo in 5/17 showed that LV was severely dilated with EF 25-30%.  CPX done in 7/19 surprisingly did not show a significant CHF limitation.   Echo was done today and reviewed: EF 25-30% with severe LV dilation, normal RV.   Patient has been stable symptomatically.  He was able to push mow his grass this week without problems.  No dyspnea walking on flat ground or up 1 flight of stairs.  No orthopnea/PND.  No lightheadedness or chest pain.  No palpitations.  Weight is down 3 lbs.  He snores and has daytime sleepiness, did not have a diagnostic home sleep study.   Labs (1/18): K 4.3, creatinine 1.47, BNP 243, TSH normal, HIV negative, SPEP negative Labs (4/18): BNP 390, hgb 14.4, K 4.3, creatinine 1.33 Labs (7/18): K 4.3, creatinine 1.4 Labs (2/19): K 3.9, creatinine 1.43, digoxin < 0.2 Labs (5/19): K 4, creatinine 1.21, hgb 15.7, digoxin 0.5 Labs (7/19): HIV negative, digoxin 0.3 Labs (8/19): K 4, creatinine 1.3 Labs (9/19): K 4.1, creatinine 1.33 Labs (1/20): HIV negative, K 3.8, creatinine 1.26, digoxin 0.4 Labs (5/20): hgb 15.7, K 4.2, creatinine 1.22, digoxin 0.7  PMH: 1. HTN 2.  Depression 3. Chronic systolic CHF: Nonischemic cardiomyopathy, diagnosed in 12/17.  - Echo (12/17) with EF 10-15%, LV thrombus, moderate MR, PASP 47 mmHg, moderately dilated and dysfunctional RV.  - Echo (7/18) with EF 15%, moderate MR. PASP 40 mm Hg.  - LHC/RHC (12/17): No CAD; mean RA 8, PA 41/22, mean PCWP 20, CI 1.7.  - HIV, SPEP, TSH negative.  - Cardiac MRI in 4/18 showed severe LV dilation, EF 24%, prominent trabeculation (possible noncompaction), RV insertion LGE (nonspecific), no LV thrombus.   - Echo (7/18): EF 15%, severe LV dilation with diffuse HK, grade 3 diastolic dysfunction, mild-moderate MR, PASP 40 mmHg.  - Echo (5/19): EF 25-30%, severe LV dilation with diffuse hypokinesis, mild MR, mild RV dilation with normal systolic function.  - CPX (7/19): RER 1.08, peak VO2 30.2, VE/VCO2 slope 31 => no obvious cardiac limitation.  - Echo (6/20): EF 25-30%, severe LV dilation with diffuse hypokinesis, mild MR, normal RV size and systolic function.  4. LV thrombus: On warfarin.   Past Surgical History:  Procedure Laterality Date  . CARDIAC CATHETERIZATION N/A 04/02/2016   Procedure: Right/Left Heart Cath and Coronary Angiography;  Surgeon: Laurey Moralealton S Jahshua Bonito, MD;  [facility];  Service: Cardiovascular;  Laterality: N/A;  . EYE SURGERY     Left   . TOOTH EXTRACTION       Current Outpatient Medications  Medication Sig Dispense Refill  . carvedilol (COREG) 6.25  MG tablet Take 1 tablet (6.25 mg total) by mouth 2 (two) times daily. 60 tablet 6  . digoxin (LANOXIN) 0.125 MG tablet Take 1 tablet (125 mcg total) by mouth daily. 90 tablet 3  . furosemide (LASIX) 40 MG tablet Take 2 tablets (80 mg total) by mouth daily. 30 tablet 6  . hydrALAZINE (APRESOLINE) 25 MG tablet TAKE 1 TABLET BY MOUTH THREE TIMES A DAY 270 tablet 1  . isosorbide mononitrate (IMDUR) 30 MG 24 hr tablet Take 1 tablet (30 mg total) by mouth daily. 30 tablet 5  . KLOR-CON M20 20 MEQ tablet TAKE 1 TABLET  (20 MEQ TOTAL) DAILY BY MOUTH. 90 tablet 1  . latanoprost (XALATAN) 0.005 % ophthalmic solution Place 1 drop into both eyes at bedtime.    Marland Kitchen spironolactone (ALDACTONE) 25 MG tablet Take 1 tablet (25 mg total) by mouth daily. 90 tablet 3  . warfarin (COUMADIN) 5 MG tablet Take by mouth as directed by coumadin clinic 180 tablet 1  . sacubitril-valsartan (ENTRESTO) 49-51 MG Take 1 tablet by mouth 2 (two) times daily. 180 tablet 3   No current facility-administered medications for this encounter.     Allergies:   Hydrocodone   Social History:  The patient  reports that he has never smoked. He has never used smokeless tobacco. He reports that he does not drink alcohol or use drugs.   Family History:  The patient's family history includes Cancer in his father; Heart failure in his mother.   ROS:  Please see the history of present illness.   All other systems are personally reviewed and negative.   Exam:   BP 118/88   Pulse 65   Wt 119.1 kg (262 lb 9.6 oz)   SpO2 98%   BMI 36.63 kg/m  General: NAD Neck: No JVD, no thyromegaly or thyroid nodule.  Lungs: Clear to auscultation bilaterally with normal respiratory effort. CV: Nondisplaced PMI.  Heart regular S1/S2, no S3/S4, no murmur.  No peripheral edema.  No carotid bruit.  Normal pedal pulses.  Abdomen: Soft, nontender, no hepatosplenomegaly, no distention.  Skin: Intact without lesions or rashes.  Neurologic: Alert and oriented x 3.  Psych: Normal affect. Extremities: No clubbing or cyanosis.  HEENT: Normal.   Recent Labs: 08/24/2018: Hemoglobin 15.7; Platelets 287 09/26/2018: BUN 10; Creatinine, Ser 1.38; Potassium 4.1; Sodium 136  Personally reviewed   Wt Readings from Last 3 Encounters:  09/26/18 119.1 kg (262 lb 9.6 oz)  08/24/18 117.9 kg (260 lb)  05/09/18 120.2 kg (265 lb)     ASSESSMENT AND PLAN:  1. Chronic systolic CHF: Nonischemic cardiomyopathy.  EF 10-15% by echo in 12/17.  No coronary disease and low output on  cath.  HIV negative, SPEP negative.  No family history of early cardiomyopathy (mother with CHF around 63).  No ETOH/drugs. Possible viral myocarditis.  Cardiac MRI in 4/18 with severe LV dilation, EF 24%, RV insertion site LGE (nonspecific), cannot rule out noncompaction.  Echo in 5/19 showed EF 25-30% with severe LV dilation. CPX in 7/19 was quite good with minimal cardiac limitation.  Echo was done today and reviewed, still with severe LV dilation and EF 25-30%, normal RV systolic function.  NYHA class II symptoms.  Weight is lower, he is not volume overloaded on exam.  Says he is taking all his meds.  - Increase Entresto to 49/51 bid, BMET today and again in 2 wks.   - Continue Coreg 6.25 mg bid.   - Continue digoxin 0.125  and check level.   - Increase spironolactone to 25 mg daily.  - Continue current hydralazine/Imdur.  - With persistently low EF and young age, I have recommended ICD despite nonischemic etiology.  He is not a CRT candidate with relatively narrow QRS. He is still reticent to get an ICD but willing to talk with EP. I will make the referral.  2. LV thrombus: Not seen on cMRI in 4/18 or echo in 6/20.    - Continue warfarin for anticoagulation  3. ?OSA: Strong clinical suspicion for OSA.  He did a home sleep study but apparently nondiagnostic.  I will see if I can get this repeated.    Signed, Marca Ancona, MD  09/26/2018 4:42 PM  [facility] [address] [facility] [place] [address] [phone] (office) [phone] (fax)

## 2018-09-26 NOTE — Progress Notes (Signed)
Echocardiogram 2D Echocardiogram has been performed.  Kenneth Robertson 09/26/2018, 10:03 AM

## 2018-09-26 NOTE — Patient Instructions (Signed)
Labs were done today. We will call you with any ABNORMAL results. No news is good news!  You can take over the counter Colace for constipation.  INCREASE Entresto to 49/51 mg (1 tab) twice a day. You may 2 tablets, twice a day of any remaining 24/26 mg tablets you may have. A new prescription has been sent to your pharmacy.  INCREASE Spirolactone to 25 mg (1 tab) each day.  You have been referred to Electrophysiology, this office will call to schedule an appointment with you.  Please follow up with repeat labs in 10 days.   Your physician recommends that you schedule a follow-up appointment in: 3 months with Dr Aundra Dubin.  At the [facility], you and your health needs are our priority. As part of our continuing mission to provide you with exceptional heart care, we have created designated Provider Care Teams. These Care Teams include your primary Cardiologist (physician) and Advanced Practice Providers (APPs- Physician Assistants and Nurse Practitioners) who all work together to provide you with the care you need, when you need it.   You may see any of the following providers on your designated Care Team at your next follow up: Marland Kitchen Dr Glori Bickers . Dr Loralie Champagne . Darrick Grinder, NP

## 2018-09-27 ENCOUNTER — Other Ambulatory Visit (HOSPITAL_COMMUNITY): Payer: Self-pay

## 2018-09-27 DIAGNOSIS — R0683 Snoring: Secondary | ICD-10-CM

## 2018-09-27 NOTE — Progress Notes (Signed)
Sleep study non diagnostic per Dr Aundra Dubin, new order, OV, and demographics faxed to [facility]

## 2018-10-05 ENCOUNTER — Telehealth (HOSPITAL_COMMUNITY): Payer: Self-pay | Admitting: Cardiology

## 2018-10-05 NOTE — Telephone Encounter (Signed)
COVID screening completed. ° °-[facility] °

## 2018-10-09 ENCOUNTER — Other Ambulatory Visit (HOSPITAL_COMMUNITY): Payer: Medicare Other

## 2018-10-13 ENCOUNTER — Telehealth (HOSPITAL_COMMUNITY): Payer: Self-pay | Admitting: Licensed Clinical Social Worker

## 2018-10-13 NOTE — Telephone Encounter (Signed)
CSW contacted patient to follow up on [facility] and supportive needs. Message left for return call. Jackie Saesha Llerenas, LCSW, CCSW-MCS [phone] 

## 2018-11-22 ENCOUNTER — Telehealth (HOSPITAL_COMMUNITY): Payer: Self-pay | Admitting: Licensed Clinical Social Worker

## 2018-11-22 NOTE — Telephone Encounter (Signed)
CSW contacted patient to follow up and offer support due to Covid-19 and inability to meet with [facility]. Message left for return call if needed. Jackie Titania Gault, LCSW, CCSW-MCS [phone] 

## 2018-11-23 ENCOUNTER — Encounter: Payer: Self-pay | Admitting: Family Medicine

## 2018-11-24 ENCOUNTER — Telehealth (INDEPENDENT_AMBULATORY_CARE_PROVIDER_SITE_OTHER): Payer: Medicare Other | Admitting: Family Medicine

## 2018-11-24 ENCOUNTER — Other Ambulatory Visit: Payer: Self-pay

## 2018-11-24 DIAGNOSIS — Z113 Encounter for screening for infections with a predominantly sexual mode of transmission: Secondary | ICD-10-CM

## 2018-11-24 DIAGNOSIS — R197 Diarrhea, unspecified: Secondary | ICD-10-CM

## 2018-11-24 DIAGNOSIS — Z7901 Long term (current) use of anticoagulants: Secondary | ICD-10-CM | POA: Diagnosis not present

## 2018-11-24 NOTE — Progress Notes (Signed)
[place] Telemedicine Visit  Patient consented to have virtual visit. Method of visit: Telephone  Encounter participants: Patient: Kenneth Robertson - located at home Provider: Daisy Floro - located at clinic Others (if applicable): none  Chief Complaint: "Can't keep nothing down"  HPI: Patient calls in today with concerns for diarrhea for the past week. Reports it got a little better yesterday, it feels like it's easing up. He has been drinking hot tea with lemon and pepsi to settle the stomach. He has not taken any anti-diarrheal meds. He has not tried any new foods, he has not been traveling.   He states he ate chicken nuggets 2 weeks ago and then again this past Sunday. He ate shrimp from [employer] place [place] recently, and thought something was wrong with them at the time but he kept eating them, anyways.   Water source: city water  Stools have been normal light brown.   Has been having sneezing, stomach cramping and stomach "gurgling". No fevers, nausea, vomiting, myalgias, or rashes. Denies blood in stools or black, tarry stools.  Dry weight: patient thinks is around 255 lbs  ROS: per HPI  Pertinent PMHx: CHF  Exam:  Respiratory: normal work of breathing  Assessment/Plan: Screening examination for STD (sexually transmitted disease) Patient requesting STI screen at next Lab appt 11/27/2018. -Orders placed  Long term (current) use of anticoagulants Patient taking Warfarin, no INR check since May 2020. -INR order placed for lab appt 8/24  Diarrhea -Stay hydrated with tea, water, gatorade -Hold Lasix 80mg  until lab check Monday, 8/24 -Eat very bland diet -Patient instructed to weigh himself daily  Time spent during visit with patient: 23:04 minutes  Milus Banister, [place], PGY-2 11/24/2018 10:34 AM

## 2018-11-24 NOTE — Assessment & Plan Note (Signed)
Patient taking Warfarin, no INR check since May 2020. -INR order placed for lab appt 8/24

## 2018-11-24 NOTE — Assessment & Plan Note (Signed)
Patient requesting STI screen at next Lab appt 11/27/2018. -Orders placed

## 2018-11-24 NOTE — Assessment & Plan Note (Signed)
-  Stay hydrated with tea, water, gatorade -Hold Lasix 80mg  until lab check Monday, 8/24 -Eat very bland diet -Patient instructed to weigh himself daily

## 2018-11-27 ENCOUNTER — Encounter: Payer: Self-pay | Admitting: Family Medicine

## 2018-11-27 ENCOUNTER — Ambulatory Visit (INDEPENDENT_AMBULATORY_CARE_PROVIDER_SITE_OTHER): Payer: Medicare Other | Admitting: *Deleted

## 2018-11-27 ENCOUNTER — Other Ambulatory Visit (HOSPITAL_COMMUNITY)
Admission: RE | Admit: 2018-11-27 | Discharge: 2018-11-27 | Disposition: A | Discharge status: Home / Self Care | Payer: Medicare Other | Source: Ambulatory Visit | Attending: Family Medicine | Admitting: Family Medicine

## 2018-11-27 ENCOUNTER — Other Ambulatory Visit: Payer: Self-pay

## 2018-11-27 ENCOUNTER — Ambulatory Visit (INDEPENDENT_AMBULATORY_CARE_PROVIDER_SITE_OTHER): Payer: Medicare Other | Admitting: Family Medicine

## 2018-11-27 VITALS — BP 110/72 | HR 76 | Wt 257.4 lb

## 2018-11-27 DIAGNOSIS — Z114 Encounter for screening for human immunodeficiency virus [HIV]: Secondary | ICD-10-CM

## 2018-11-27 DIAGNOSIS — Z113 Encounter for screening for infections with a predominantly sexual mode of transmission: Secondary | ICD-10-CM | POA: Insufficient documentation

## 2018-11-27 DIAGNOSIS — Z7901 Long term (current) use of anticoagulants: Secondary | ICD-10-CM | POA: Diagnosis not present

## 2018-11-27 DIAGNOSIS — R197 Diarrhea, unspecified: Secondary | ICD-10-CM | POA: Diagnosis not present

## 2018-11-27 LAB — POCT INR: INR: 2.4 (ref 2.0–3.0)

## 2018-11-27 NOTE — Assessment & Plan Note (Addendum)
1.5 weeks of diarrhea, nonbloody with no other symptoms.  Advised patient to allow another week to see if symptoms resolve before a more extensive workup is performed.  Likely infectious.

## 2018-11-27 NOTE — Patient Instructions (Signed)
I will call you with the results of your test.  If you have diarrhea a week from now you should make an appointment to see [place] again.  At that point we can get further testing.  It is most likely a virus, and should get better over the next week.  If symptoms get worse between now and then you can always call back to make another appointment sooner.  Have a great day,  Clemetine Marker, MD

## 2018-11-27 NOTE — Progress Notes (Signed)
   [facility] Phone: [phone]     Kenneth Robertson - 38 y.o. male MRN [id number]  Date of birth: 05/05/1980  Subjective:   cc: diarrhea  HPI:  Diarrhea: bpatient has been complaining of one and a half weeks of nonbloody diarrhea and gas.  He has not taken any medication for it but has started drinking soda.  His diarrhea is a pale tan color.  He believes it smells worse than a regualr BM.  He does not have any reflux or dyspepsia.  He started eating out two weeks ago and ate shrimp at a [employer] a few days before getting diarrhea.  He says his throat 'felt funny' after eating the shrimp.  He has diarrhea 3 times a day but has not had nausea or vomiting. He denies rectal pain.    The patient was Sexually active two weeks ago. He had  Oral sex with a male partner.  Last time he had anal sex has been 'a long time ago'.    No rectal pain.  No blood.     ROS: See HPI for pertinent positives and negatives  Past Medical History  Family history reviewed for today's visit. No changes.  Social history- patient is a non smoker  Health Maintenance:  -  Health Maintenance Due  Topic Date Due  . TETANUS/TDAP  09/27/1998  . INFLUENZA VACCINE  11/04/2018    -  reports that he has never smoked. He has never used smokeless tobacco.  Objective:   BP 110/72   Pulse 76   Wt 257 lb 6.4 oz (116.8 kg)   SpO2 98%   BMI 35.90 kg/m  Gen: NAD, alert and oriented, cooperative with exam CV: normal rate, regular rhythm. 2/6 systolic murmur, no rubs.  Resp: LCTAB, no wheezes, crackles. normal work of breathing GI: nontender to palpation, BS present, no guarding or organomegaly Psych: Appropriate behavior  Assessment/Plan:   Diarrhea 1.5 weeks of diarrhea, nonbloody with no other symptoms.  Advised patient to allow another week to see if symptoms resolve before a more extensive workup is performed.  Likely infectious.   Screening examination for STD (sexually  transmitted disease) Recent oral sex with male partner.  Patient desires STD check.   - hiv, rpr, hep B, oropharyngeal GC/CT swab.  - consider hep B vaccinatin in future if patient has not had one.     Orders Placed This Encounter  Procedures  . HIV Antibody (routine testing w rflx)  . RPR  . Hepatitis B surface antigen  . Hepatitis B surface antibody,qualitative    No orders of the defined types were placed in this encounter.    Clemetine Marker, MD PGY-2 [facility]

## 2018-11-27 NOTE — Assessment & Plan Note (Signed)
Recent oral sex with male partner.  Patient desires STD check.   - hiv, rpr, hep B, oropharyngeal GC/CT swab.  - consider hep B vaccinatin in future if patient has not had one.

## 2018-11-28 ENCOUNTER — Telehealth: Payer: Self-pay | Admitting: Family Medicine

## 2018-11-28 LAB — HEPATITIS B SURFACE ANTIBODY,QUALITATIVE: Hep B Surface Ab, Qual: NONREACTIVE

## 2018-11-28 LAB — HIV ANTIBODY (ROUTINE TESTING W REFLEX): HIV Screen 4th Generation wRfx: NONREACTIVE

## 2018-11-28 LAB — HEPATITIS B SURFACE ANTIGEN: Hepatitis B Surface Ag: NEGATIVE

## 2018-11-28 LAB — RPR: RPR Ser Ql: NONREACTIVE

## 2018-11-28 NOTE — Telephone Encounter (Signed)
Called and left voicemail to inform patient that his blood work was negative, and that his oropharyngeal swab was still pending.  Given the patient's sexual orientation, he would benefit from hepatitis A and B vaccination, which I do not believe he has received.  Hepatitis B surface antibody was negative during last visit.  Forwarding to PCP.

## 2018-11-29 LAB — CERVICOVAGINAL ANCILLARY ONLY
Chlamydia: NEGATIVE
Neisseria Gonorrhea: NEGATIVE

## 2018-12-09 ENCOUNTER — Encounter (INDEPENDENT_AMBULATORY_CARE_PROVIDER_SITE_OTHER): Payer: Medicare Other | Admitting: Cardiology

## 2018-12-09 DIAGNOSIS — I5022 Chronic systolic (congestive) heart failure: Secondary | ICD-10-CM

## 2018-12-15 ENCOUNTER — Ambulatory Visit (INDEPENDENT_AMBULATORY_CARE_PROVIDER_SITE_OTHER): Payer: Medicare Other | Admitting: Family Medicine

## 2018-12-15 ENCOUNTER — Encounter: Payer: Self-pay | Admitting: Family Medicine

## 2018-12-15 ENCOUNTER — Other Ambulatory Visit: Payer: Self-pay

## 2018-12-15 VITALS — BP 118/78 | HR 74 | Temp 98.5°F | Wt 256.0 lb

## 2018-12-15 DIAGNOSIS — Z23 Encounter for immunization: Secondary | ICD-10-CM | POA: Diagnosis not present

## 2018-12-15 DIAGNOSIS — R197 Diarrhea, unspecified: Secondary | ICD-10-CM

## 2018-12-15 DIAGNOSIS — K909 Intestinal malabsorption, unspecified: Secondary | ICD-10-CM

## 2018-12-15 NOTE — Assessment & Plan Note (Signed)
Agreed to vaccination today. prvious HBSAg and HBSAb negative.  - first dose given today.  Will need follow up vaccination in one and six months.

## 2018-12-15 NOTE — Progress Notes (Signed)
   [facility] Phone: [phone]     Kenneth Robertson - 38 y.o. male MRN [id number]  Date of birth: 09-13-1980  Subjective:   cc: diarrhea  HPI:  diarrhea: some improvement initially after last visit but worsened after eating dairy (patient admits he went overboard on some [employer] pizza last weekend).  He tried drinking some lactaid milk with cereal but still had diarrhea.  Has not tried almond milk.  No blood in stools.  States it is pale yellow color and foul smelling.  Started developing some LLQ pain yesterday.  He says it is chronic and a 5/10 in intensity.   Vaccination: patient willing to get hep B shots.  Admits he was scared after discussing STD infections last time and has since not had sexual intercourse.    Patient does not want PrEP treatment now, but states he will let us know if he changes his mind in the future.    ROS: See HPI for pertinent positives and negatives  Past Medical History  Family history reviewed for today's visit. No changes.  Social history- patient is a non smoker  Objective:   BP 118/78   Pulse 74   Temp 98.5 F (36.9 C) (Oral)   Wt 256 lb (116.1 kg)   BMI 35.70 kg/m  Gen: NAD, alert and oriented, cooperative with exam CV: normal rate, regular rhythm. Systolic murmur consistnet with previous exams, no rubs.  Resp: LCTAB, no wheezes, crackles. normal work of breathing GI: minimally tender to palpation in LLQ, BS present, no guarding or organomegaly Psych: Appropriate behavior  Assessment/Plan:   Diarrhea Some improvement since last visit, but having episodes of diarrhea coinciding with dairy intake. Likely d/t lactose intolerance.  Father is lactose intolerant. Still no blood. Sounds like steatorrhea based on description of color and smell.  Having mild LLQ pain.  Minimally tender on exam.  I doubt it is appendicitis based on severity/duration.   DDx: infectious, IBS, IBD - CMP today - cut out all dairy for one week.   - BID metamucil - if no improvement in one week will order GIPP and refer to GI  Need for prophylactic vaccination against viral hepatitis Agreed to vaccination today. prvious HBSAg and HBSAb negative.  - first dose given today.  Will need follow up vaccination in one and six months.   Clemetine Marker, MD PGY-2 [facility]

## 2018-12-15 NOTE — Assessment & Plan Note (Addendum)
Some improvement since last visit, but having episodes of diarrhea coinciding with dairy intake. Likely d/t lactose intolerance.  Father is lactose intolerant. Still no blood. Sounds like steatorrhea based on description of color and smell.  Having mild LLQ pain.  Minimally tender on exam.  I doubt it is appendicitis based on severity/duration.   DDx: infectious, IBS, IBD - CMP today - cut out all dairy for one week.  - BID metamucil - if no improvement in one week will order GIPP and refer to GI

## 2018-12-15 NOTE — Patient Instructions (Signed)
I want you to avoid ALL dairy products for a week and start taking metamucil twice a day.  If your diarrhea is not improving in one week we will get a stool pathogen panel, which you can do at home and return to [place], and I will refer you to a GI specialist.    We will give you a flu shot and a hep B shot today.  You will need to get a follow up shot in one month and then again in 6 months for the hep B vaccine.    Have a great day,   Clemetine Marker, MD

## 2018-12-16 LAB — COMPREHENSIVE METABOLIC PANEL
ALT: 25 IU/L (ref 0–44)
AST: 16 IU/L (ref 0–40)
Albumin/Globulin Ratio: 1.6 (ref 1.2–2.2)
Albumin: 4.3 g/dL (ref 4.0–5.0)
Alkaline Phosphatase: 97 IU/L (ref 39–117)
BUN/Creatinine Ratio: 13 (ref 9–20)
BUN: 16 mg/dL (ref 6–20)
Bilirubin Total: 0.4 mg/dL (ref 0.0–1.2)
CO2: 20 mmol/L (ref 20–29)
Calcium: 9 mg/dL (ref 8.7–10.2)
Chloride: 103 mmol/L (ref 96–106)
Creatinine, Ser: 1.25 mg/dL (ref 0.76–1.27)
GFR calc Af Amer: 83 mL/min/{1.73_m2} (ref >59)
GFR calc non Af Amer: 72 mL/min/{1.73_m2} (ref >59)
Globulin, Total: 2.7 g/dL (ref 1.5–4.5)
Glucose: 91 mg/dL (ref 65–99)
Potassium: 4.3 mmol/L (ref 3.5–5.2)
Sodium: 136 mmol/L (ref 134–144)
Total Protein: 7 g/dL (ref 6.0–8.5)

## 2018-12-18 ENCOUNTER — Ambulatory Visit: Payer: Medicare Other

## 2018-12-21 ENCOUNTER — Other Ambulatory Visit (HOSPITAL_COMMUNITY): Payer: Self-pay | Admitting: Cardiology

## 2018-12-29 ENCOUNTER — Telehealth (HOSPITAL_COMMUNITY): Payer: Self-pay | Admitting: *Deleted

## 2018-12-29 ENCOUNTER — Encounter (HOSPITAL_COMMUNITY): Payer: Self-pay | Admitting: Cardiology

## 2018-12-29 ENCOUNTER — Ambulatory Visit (HOSPITAL_COMMUNITY)
Admission: RE | Admit: 2018-12-29 | Discharge: 2018-12-29 | Disposition: A | Discharge status: Home / Self Care | Payer: Medicare Other | Source: Ambulatory Visit | Attending: Cardiology | Admitting: Cardiology

## 2018-12-29 ENCOUNTER — Other Ambulatory Visit: Payer: Self-pay

## 2018-12-29 VITALS — BP 99/65 | HR 89 | Wt 245.8 lb

## 2018-12-29 DIAGNOSIS — Z8249 Family history of ischemic heart disease and other diseases of the circulatory system: Secondary | ICD-10-CM | POA: Diagnosis not present

## 2018-12-29 DIAGNOSIS — R0602 Shortness of breath: Secondary | ICD-10-CM | POA: Diagnosis not present

## 2018-12-29 DIAGNOSIS — I513 Intracardiac thrombosis, not elsewhere classified: Secondary | ICD-10-CM | POA: Diagnosis not present

## 2018-12-29 DIAGNOSIS — K529 Noninfective gastroenteritis and colitis, unspecified: Secondary | ICD-10-CM | POA: Diagnosis not present

## 2018-12-29 DIAGNOSIS — R42 Dizziness and giddiness: Secondary | ICD-10-CM | POA: Diagnosis not present

## 2018-12-29 DIAGNOSIS — Z79899 Other long term (current) drug therapy: Secondary | ICD-10-CM | POA: Insufficient documentation

## 2018-12-29 DIAGNOSIS — I110 Hypertensive heart disease with heart failure: Secondary | ICD-10-CM | POA: Diagnosis not present

## 2018-12-29 DIAGNOSIS — I428 Other cardiomyopathies: Secondary | ICD-10-CM | POA: Insufficient documentation

## 2018-12-29 DIAGNOSIS — I240 Acute coronary thrombosis not resulting in myocardial infarction: Secondary | ICD-10-CM

## 2018-12-29 DIAGNOSIS — Z7901 Long term (current) use of anticoagulants: Secondary | ICD-10-CM | POA: Insufficient documentation

## 2018-12-29 DIAGNOSIS — I5022 Chronic systolic (congestive) heart failure: Secondary | ICD-10-CM | POA: Insufficient documentation

## 2018-12-29 LAB — BASIC METABOLIC PANEL
Anion gap: 11 (ref 5–15)
BUN: 11 mg/dL (ref 6–20)
CO2: 23 mmol/L (ref 22–32)
Calcium: 8.8 mg/dL — ABNORMAL LOW (ref 8.9–10.3)
Chloride: 102 mmol/L (ref 98–111)
Creatinine, Ser: 1.36 mg/dL — ABNORMAL HIGH (ref 0.61–1.24)
GFR calc Af Amer: >60 mL/min (ref >60)
GFR calc non Af Amer: >60 mL/min (ref >60)
Glucose, Bld: 108 mg/dL — ABNORMAL HIGH (ref 70–99)
Potassium: 4 mmol/L (ref 3.5–5.1)
Sodium: 136 mmol/L (ref 135–145)

## 2018-12-29 LAB — BRAIN NATRIURETIC PEPTIDE: B Natriuretic Peptide: 53 pg/mL (ref 0.0–100.0)

## 2018-12-29 LAB — DIGOXIN LEVEL: Digoxin Level: 0.8 ng/mL (ref 0.8–2.0)

## 2018-12-29 MED ORDER — FUROSEMIDE 40 MG PO TABS: 40.0000 mg | ORAL_TABLET | Freq: Every day | ORAL | 11 refills | Status: DC

## 2018-12-29 NOTE — Telephone Encounter (Signed)
Refill encounter ?

## 2018-12-29 NOTE — Patient Instructions (Signed)
DECREASE Entresto to 24/26mg  (1 tab) twice a day  DECREASE Lasix to 40mg  (1 tab) daily  Labs today We will only contact you if something comes back abnormal or we need to make some changes. Otherwise no news is good news!  Office will follow up with the sleep study.  We will let you know any updates we receive regarding your results.   Your physician recommends that you schedule a follow-up appointment in: 2 months with Dr Aundra Dubin.   At the [facility], you and your health needs are our priority. As part of our continuing mission to provide you with exceptional heart care, we have created designated Provider Care Teams. These Care Teams include your primary Cardiologist (physician) and Advanced Practice Providers (APPs- Physician Assistants and Nurse Practitioners) who all work together to provide you with the care you need, when you need it.   You may see any of the following providers on your designated Care Team at your next follow up: Marland Kitchen Dr Glori Bickers . Dr Loralie Champagne . Darrick Grinder, NP   Please be sure to bring in all your medications bottles to every appointment.

## 2018-12-29 NOTE — Telephone Encounter (Signed)
Per DR.McLean patient can decrease entresto to 49/51 mg twice daily. Called pt no answer/left msg.

## 2018-12-29 NOTE — Telephone Encounter (Signed)
-----   Message from Jorge Ny, [place] sent at 12/28/2018  2:10 PM EDT ----- Patient called and stated he is having trouble with his entresto- states a new script was sent in for a higher dose and he has not been feeling well since starting this new does (lightheaded/spinning)

## 2018-12-31 NOTE — Progress Notes (Signed)
Date:  12/31/2018   ID:  Kenneth Robertson, DOB 09-06-1980, MRN [id number]    Provider location: [address] [place] Type of Visit: Established patient  PCP:  [name], MD  Cardiologist:  No primary care provider on file. Primary HF: Dr. [name]  Chief Complaint: Shortness of breath   History of Present Illness: Kenneth Robertson is a 38 y.o. male  with history of HTN and nonischemic cardiomyopathy who presents for followup of CHF.  He was admitted in 12/17 after 1 month of worsening dyspnea.  He was volume overloaded on exam with EF 10-15% on echo.  He was diuresed and RHC/LHC done.  This showed no CAD. Cardiac output was low.  Cardiac MRI in 4/18 showed severe LV dilation, EF 24%, prominent trabeculation (possible noncompaction), RV insertion LGE (nonspecific).    Echo in 6/20 showed that LV was severely dilated with EF 25-30%.  CPX done in 7/19 surprisingly did not show a significant CHF limitation.   He has lost 27 lbs since last appointment.  He has had chronic diarrhea per his report for 2 months now.  Being worked up currently by PCP.  Still taking the same Lasix dose, 80 mg daily. Since increasing Entresto, he has noted lightheadedness with standing and walking that has been bothersome. BP is 99/65 today.  He has not passed out or fallen. No significant exertional dyspnea.  No orthopnea/PND. No chest pain.    Labs (1/18): K 4.3, creatinine 1.47, BNP 243, TSH normal, HIV negative, SPEP negative Labs (4/18): BNP 390, hgb 14.4, K 4.3, creatinine 1.33 Labs (7/18): K 4.3, creatinine 1.4 Labs (2/19): K 3.9, creatinine 1.43, digoxin < 0.2 Labs (5/19): K 4, creatinine 1.21, hgb 15.7, digoxin 0.5 Labs (7/19): HIV negative, digoxin 0.3 Labs (8/19): K 4, creatinine 1.3 Labs (9/19): K 4.1, creatinine 1.33 Labs (1/20): HIV negative, K 3.8, creatinine 1.26, digoxin 0.4 Labs (5/20): hgb 15.7, K 4.2, creatinine 1.22, digoxin 0.7 Labs (9/20): K 4.3, creatinine 1.25, HIV  negative  ECG (personally reviewed): NSR, LAFB, anterolateral T wave inversions  PMH: 1. HTN 2. Depression 3. Chronic systolic CHF: Nonischemic cardiomyopathy, diagnosed in 12/17.  - Echo (12/17) with EF 10-15%, LV thrombus, moderate MR, PASP 47 mmHg, moderately dilated and dysfunctional RV.  - Echo (7/18) with EF 15%, moderate MR. PASP 40 mm Hg.  - LHC/RHC (12/17): No CAD; mean RA 8, PA 41/22, mean PCWP 20, CI 1.7.  - HIV, SPEP, TSH negative.  - Cardiac MRI in 4/18 showed severe LV dilation, EF 24%, prominent trabeculation (possible noncompaction), RV insertion LGE (nonspecific), no LV thrombus.   - Echo (7/18): EF 15%, severe LV dilation with diffuse HK, grade 3 diastolic dysfunction, mild-moderate MR, PASP 40 mmHg.  - Echo (5/19): EF 25-30%, severe LV dilation with diffuse hypokinesis, mild MR, mild RV dilation with normal systolic function.  - CPX (7/19): RER 1.08, peak VO2 30.2, VE/VCO2 slope 31 => no obvious cardiac limitation.  - Echo (6/20): EF 25-30%, severe LV dilation with diffuse hypokinesis, mild MR, normal RV size and systolic function.  4. LV thrombus: On warfarin.   Past Surgical History:  Procedure Laterality Date  . CARDIAC CATHETERIZATION N/A 04/02/2016   Procedure: Right/Left Heart Cath and Coronary Angiography;  Surgeon: Laurey Morale, MD;  Location: [facility] INVASIVE CV LAB;  Service: Cardiovascular;  Laterality: N/A;  . EYE SURGERY     Left   . TOOTH EXTRACTION  Current Outpatient Medications  Medication Sig Dispense Refill  . carvedilol (COREG) 6.25 MG tablet Take 1 tablet (6.25 mg total) by mouth 2 (two) times daily. 60 tablet 6  . digoxin (LANOXIN) 0.125 MG tablet Take 1 tablet (125 mcg total) by mouth daily. 90 tablet 3  . ENTRESTO 24-26 MG TAKE 1 TABLET BY MOUTH TWICE A DAY 60 tablet 6  . hydrALAZINE (APRESOLINE) 25 MG tablet TAKE 1 TABLET BY MOUTH THREE TIMES A DAY 270 tablet 1  . isosorbide mononitrate (IMDUR) 30 MG 24 hr tablet Take 1 tablet (30 mg  total) by mouth daily. 30 tablet 5  . KLOR-CON M20 20 MEQ tablet TAKE 1 TABLET (20 MEQ TOTAL) DAILY BY MOUTH. 90 tablet 1  . latanoprost (XALATAN) 0.005 % ophthalmic solution Place 1 drop into both eyes at bedtime.    Marland Kitchen spironolactone (ALDACTONE) 25 MG tablet Take 1 tablet (25 mg total) by mouth daily. 90 tablet 3  . warfarin (COUMADIN) 5 MG tablet Take by mouth as directed by coumadin clinic 180 tablet 1  . furosemide (LASIX) 40 MG tablet Take 1 tablet (40 mg total) by mouth daily. 30 tablet 11   No current facility-administered medications for this encounter.     Allergies:   Hydrocodone   Social History:  The patient  reports that he has never smoked. He has never used smokeless tobacco. He reports that he does not drink alcohol or use drugs.   Family History:  The patient's family history includes Cancer in his father; Heart failure in his mother.   ROS:  Please see the history of present illness.   All other systems are personally reviewed and negative.   Exam:   BP 99/65   Pulse 89   Wt 111.5 kg (245 lb 12.8 oz)   SpO2 97%   BMI 34.28 kg/m  General: NAD Neck: No JVD, no thyromegaly or thyroid nodule.  Lungs: Clear to auscultation bilaterally with normal respiratory effort. CV: Nondisplaced PMI.  Heart regular S1/S2, no S3/S4, no murmur.  No peripheral edema.  No carotid bruit.  Normal pedal pulses.  Abdomen: Soft, nontender, no hepatosplenomegaly, no distention.  Skin: Intact without lesions or rashes.  Neurologic: Alert and oriented x 3.  Psych: Normal affect. Extremities: No clubbing or cyanosis.  HEENT: Normal.   Recent Labs: 08/24/2018: Hemoglobin 15.7; Platelets 287 12/15/2018: ALT 25 12/29/2018: B Natriuretic Peptide 53.0; BUN 11; Creatinine, Ser 1.36; Potassium 4.0; Sodium 136  Personally reviewed   Wt Readings from Last 3 Encounters:  12/29/18 111.5 kg (245 lb 12.8 oz)  12/15/18 116.1 kg (256 lb)  11/27/18 116.8 kg (257 lb 6.4 oz)     ASSESSMENT AND PLAN:   1. Chronic systolic CHF: Nonischemic cardiomyopathy.  EF 10-15% by echo in 12/17.  No coronary disease and low output on cath.  HIV negative, SPEP negative.  No family history of early cardiomyopathy (mother with CHF around 76).  No ETOH/drugs. Possible viral myocarditis.  Cardiac MRI in 4/18 with severe LV dilation, EF 24%, RV insertion site LGE (nonspecific), cannot rule out noncompaction.  Echo in 5/19 showed EF 25-30% with severe LV dilation. CPX in 7/19 was quite good with minimal cardiac limitation.  Echo in 6/20 showed severe LV dilation and EF 25-30%, normal RV systolic function.  NYHA class II symptoms.  Weight is lower in the setting of chronic diarrhea and he has orthostatic symptoms that are worse since increasing Entresto.  - Decrease Entresto back to 24/26 bid.  May  be able to increase again when chronic diarrhea has resolved.   - Continue Coreg 6.25 mg bid.   - Continue digoxin 0.125 and check level.   - Continue spironolactone 25 mg daily.  - Continue current hydralazine/Imdur.  - With chronic diarrhea, I will have him decrease Lasix to 40 mg daily.   - Consider SGLT2 inhibitor when diarrhea has resolved.  - With persistently low EF and young age, I have recommended ICD despite nonischemic etiology.  He is not a CRT candidate with relatively narrow QRS. He is still reticent to get an ICD but willing to talk with EP, still needs to get appointment.  2. LV thrombus: Not seen on cMRI in 4/18 or echo in 6/20.    - Continue warfarin for anticoagulation  3. ?OSA: Strong clinical suspicion for OSA.  He had a repeat home sleep study, waiting for the result.   Followup in 2 months.   Signed, Loralie Champagne, MD  12/31/2018  [facility] [address] [facility] [place] [address] [phone] (office) [phone] (fax)

## 2019-01-02 ENCOUNTER — Telehealth (HOSPITAL_COMMUNITY): Payer: Self-pay

## 2019-01-02 DIAGNOSIS — I5022 Chronic systolic (congestive) heart failure: Secondary | ICD-10-CM

## 2019-01-02 NOTE — Telephone Encounter (Signed)
Called patient to discuss recommendations for f/u with EP for ICD. Referral placed.

## 2019-01-02 NOTE — Telephone Encounter (Signed)
Called [address] and spoke with Gae Bon regarding patients sleep study results.  She was unable to locate results. Email sent to rep Cecilie Lowers to assist with locating them. Pt reports he completed test earlier this month.  Awaiting response.

## 2019-01-02 NOTE — Telephone Encounter (Signed)
-----   Message from Larey Dresser, MD sent at 12/31/2018  3:41 PM EDT ----- Please make sure he has appointment with EP to discuss ICD.

## 2019-01-02 NOTE — Telephone Encounter (Signed)
Pt walked into office inquiring about reason for our call.  Advised of sleep study results and made him aware he was referred to EP.  He was appreciative and verbalized understanding.

## 2019-01-02 NOTE — Telephone Encounter (Signed)
Received copy of sleep report from [place] (sleep study rep).   He made note that it has not been signed off by Dr Radford Pax.  Copy of report reviewed by Dr Aundra Dubin, pt does not have sleep apnea per report. Awaiting patient call back to make him aware.

## 2019-01-04 ENCOUNTER — Ambulatory Visit (INDEPENDENT_AMBULATORY_CARE_PROVIDER_SITE_OTHER): Payer: Medicare Other | Admitting: Family Medicine

## 2019-01-04 ENCOUNTER — Other Ambulatory Visit: Payer: Self-pay

## 2019-01-04 ENCOUNTER — Encounter: Payer: Self-pay | Admitting: Family Medicine

## 2019-01-04 VITALS — BP 120/72 | HR 76 | Ht 71.0 in | Wt 248.0 lb

## 2019-01-04 DIAGNOSIS — K529 Noninfective gastroenteritis and colitis, unspecified: Secondary | ICD-10-CM

## 2019-01-04 DIAGNOSIS — R197 Diarrhea, unspecified: Secondary | ICD-10-CM

## 2019-01-04 NOTE — Assessment & Plan Note (Signed)
Chronic but has resolved in the last 1 week. Denies any concern today. He prefers a second opinion from a gastroenterologist since his diarrhea lingered on longer than usual. Referral placed. F/U soon with PCP for health maintenance

## 2019-01-04 NOTE — Patient Instructions (Signed)
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
## 2019-01-15 ENCOUNTER — Ambulatory Visit: Payer: Medicare Other

## 2019-01-15 ENCOUNTER — Telehealth: Payer: Self-pay | Admitting: *Deleted

## 2019-01-15 ENCOUNTER — Other Ambulatory Visit: Payer: Self-pay

## 2019-01-15 DIAGNOSIS — R0683 Snoring: Secondary | ICD-10-CM

## 2019-01-15 NOTE — Telephone Encounter (Signed)
-----   Message from Sueanne Margarita, MD sent at 01/15/2019 10:58 AM EDT ----- Please let patient know that sleep study showed no significant sleep apnea.

## 2019-01-15 NOTE — Procedures (Signed)
   Patient Information Name: Kenneth Robertson  ID: [id number] Birth Date: 28-Apr-1980  Age: 38  Gender: Male Insurer: BMI: Study Date:12/09/2018 Referring Physician: Loralie Champagne, MD  Summary & Diagnosis TEST DESCRIPTION: Home sleep apnea testing was completed using the WatchPat, a Type 1 device, utilizing peripheral arterial tonometry (PAT), chest movement, actigraphy, pulse oximetry, pulse rate, body position and snore. AHI was calculated with apnea and hypopnea using valid sleep time as the denominator. RDI includes apneas, hypopneas, and RERAs. The data acquired and the scoring of sleep and all associated events were performed in accordance with the recommended standards and specifications as outlined in the AASM Manual for the Scoring of Sleep and Associated Events 2.2.0 (2015).  DIAGNOSIS:  Normal study with no significant sleep disordered breathing.  Recommendations 1. Normal study with no significant sleep disordered breathing.  2. An ENT consultation which may be useful for specific causes of and possible treatment of bothersome snoring .  3. Weight loss may be of benefit in reducing the severity of snoring. 3  Report prepared by: Signature: Fransico Him Electronically Signed: Jan 15, 2019

## 2019-01-15 NOTE — Telephone Encounter (Signed)
Informed patient of sleep study results and patient understanding was verbalized. Patient understands his sleep study showed no significant sleep apnea.   Pt is aware and agreeable to normal results. 

## 2019-01-22 ENCOUNTER — Other Ambulatory Visit: Payer: Self-pay | Admitting: Family Medicine

## 2019-01-23 ENCOUNTER — Other Ambulatory Visit (HOSPITAL_COMMUNITY): Payer: Self-pay | Admitting: Cardiology

## 2019-01-23 DIAGNOSIS — I428 Other cardiomyopathies: Secondary | ICD-10-CM

## 2019-01-23 DIAGNOSIS — I5022 Chronic systolic (congestive) heart failure: Secondary | ICD-10-CM

## 2019-01-26 ENCOUNTER — Institutional Professional Consult (permissible substitution): Payer: Medicare Other | Admitting: Internal Medicine

## 2019-01-29 ENCOUNTER — Other Ambulatory Visit: Payer: Self-pay | Admitting: Family Medicine

## 2019-03-05 ENCOUNTER — Institutional Professional Consult (permissible substitution): Payer: Medicare Other | Admitting: Internal Medicine

## 2019-03-06 ENCOUNTER — Encounter (HOSPITAL_COMMUNITY): Payer: Medicare Other | Admitting: Cardiology

## 2019-03-07 ENCOUNTER — Telehealth (HOSPITAL_COMMUNITY): Payer: Self-pay

## 2019-03-07 NOTE — Telephone Encounter (Signed)
Pt called asking if it was ok to take musinex for cold symptoms. Advised it was ok according to the HF guidelines for cold/cough symptoms.

## 2019-04-04 ENCOUNTER — Ambulatory Visit: Payer: Medicare Other

## 2019-04-04 ENCOUNTER — Telehealth (INDEPENDENT_AMBULATORY_CARE_PROVIDER_SITE_OTHER): Payer: Medicare Other | Admitting: Family Medicine

## 2019-04-04 ENCOUNTER — Other Ambulatory Visit: Payer: Self-pay

## 2019-04-04 ENCOUNTER — Encounter: Payer: Self-pay | Admitting: Family Medicine

## 2019-04-04 DIAGNOSIS — B359 Dermatophytosis, unspecified: Secondary | ICD-10-CM | POA: Diagnosis present

## 2019-04-04 DIAGNOSIS — R21 Rash and other nonspecific skin eruption: Secondary | ICD-10-CM | POA: Diagnosis not present

## 2019-04-04 MED ORDER — TERBINAFINE HCL 1 % EX CREA: 1.0000 "application " | TOPICAL_CREAM | Freq: Two times a day (BID) | CUTANEOUS | 0 refills | Status: AC

## 2019-04-04 NOTE — Progress Notes (Signed)
[place] Telemedicine Visit  Patient consented to have virtual visit. Method of visit: Video was attempted, but technology challenges prevented patient from using video, so visit was conducted via telephone.  Encounter participants: Patient: Kenneth Robertson - located at home Provider: Patriciaann Clan - located at home  Others (if applicable): none   Chief Complaint: hands red/flaky   HPI: Kenneth Robertson   Hand lesions: Both hands/palms and fingers, "breaking out" for the past several months. Skin is red, flaky, itchy at times. Has a history of eczema usually on on the back of his legs. He has tried neosporin and ezcema cream with some relief. Denies any rash elsewhere, no concerns with his feet. No known liver disease, however has CHF.  Does not seem to be getting worse since started, however very bothersome visually and physically.  He is concerned it may be related to a food allergy that he had to shrimp back in the summer as it seemed to start after this.  Denies any associated fever, fatigue.   ROS: per HPI  Pertinent PMHx: CHF with restrictive heart disease (echo in 09/2018 showing 25-30% EF)   Exam:  Respiratory: Unlabored breathing, speaking in full sentences Mychart media sent: Note hyperpigmentation to palms and few fingers with flakiness/dry cracked skin and possible blistering on fingers.  Assessment/Plan:  Rash of both hands Chronic for the past several months.  Unfortunately, only able to evaluate visualization of hands through MyChart media, however appearance similar to tinea mannum.  Could also consider dyshidrotic eczema, however would expect this to be more severely pruritic and unlikely to go untreated this long.  Additionally considered liver disease given bilateral involvement with palmar erythema, however does not have additional symptoms suggestive of this, could consider obtaining labs in the future.  Will go ahead and treat for tinea with Lamisil  cream twice daily for at least the next 2 weeks and have him follow-up in the clinic.     Follow-up in 2 weeks for hands or sooner if needed/worsening. Could consider oral antifungal in the future if needed, however will postpone given likely medication interactions with his cardiac regimen.  Time spent during visit with patient: 17 minutes

## 2019-04-04 NOTE — Assessment & Plan Note (Addendum)
Chronic for the past several months.  Unfortunately, only able to evaluate visualization of hands through MyChart media, however appearance similar to tinea mannum.  Could also consider dyshidrotic eczema, however would expect this to be more severely pruritic and unlikely to go untreated this long.  Additionally considered liver disease given bilateral involvement with palmar erythema, however does not have additional symptoms suggestive of this, could consider obtaining labs in the future.  Will go ahead and treat for tinea with Lamisil cream twice daily for at least the next 2 weeks and have him follow-up in the clinic.

## 2019-04-08 DIAGNOSIS — I428 Other cardiomyopathies: Secondary | ICD-10-CM | POA: Insufficient documentation
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
## 2019-04-10 ENCOUNTER — Encounter: Payer: Self-pay | Admitting: Internal Medicine

## 2019-04-10 ENCOUNTER — Other Ambulatory Visit: Payer: Self-pay

## 2019-04-10 ENCOUNTER — Ambulatory Visit (INDEPENDENT_AMBULATORY_CARE_PROVIDER_SITE_OTHER): Payer: Medicare Other | Admitting: Internal Medicine

## 2019-04-10 VITALS — BP 126/94 | HR 71 | Ht 71.0 in | Wt 240.4 lb

## 2019-04-10 DIAGNOSIS — I5022 Chronic systolic (congestive) heart failure: Secondary | ICD-10-CM

## 2019-04-10 DIAGNOSIS — I428 Other cardiomyopathies: Secondary | ICD-10-CM

## 2019-04-10 NOTE — Patient Instructions (Signed)
Medication Instructions:  Your physician recommends that you continue on your current medications as directed. Please refer to the Current Medication list given to you today.  *If you need a refill on your cardiac medications before your next appointment, please call your pharmacy*  Lab Work: None ordered.  If you have labs (blood work) drawn today and your tests are completely normal, you will receive your results only by: Marland Kitchen MyChart Message (if you have MyChart) OR . A paper copy in the mail If you have any lab test that is abnormal or we need to change your treatment, we will call you to review the results.  Testing/Procedures: None ordered.   Follow-Up:  You will follow up with Dr Graciela Husbands as needed.  At [facility], you and your health needs are our priority.  As part of our continuing mission to provide you with exceptional heart care, we have created designated Provider Care Teams.  These Care Teams include your primary Cardiologist (physician) and Advanced Practice Providers (APPs -  Physician Assistants and Nurse Practitioners) who all work together to provide you with the care you need, when you need it.

## 2019-04-11 ENCOUNTER — Telehealth: Payer: Self-pay | Admitting: *Deleted

## 2019-04-11 NOTE — Telephone Encounter (Signed)
[facility] Indigent Fund Voucher:  Previous assistance?  No  What was provided? $10 [employer] gift card for gas  Provider:  Annia Friendly  Clinic Staff:  Jone Baseman, CMA  Note routed to [facility], CMA and Sammuel Hines, LCSW to add to [employer].

## 2019-04-16 ENCOUNTER — Telehealth (HOSPITAL_COMMUNITY): Payer: Self-pay

## 2019-04-16 NOTE — Telephone Encounter (Signed)

## 2019-04-17 ENCOUNTER — Ambulatory Visit (HOSPITAL_COMMUNITY)
Admission: RE | Admit: 2019-04-17 | Discharge: 2019-04-17 | Disposition: A | Discharge status: Home / Self Care | Payer: Medicare Other | Source: Ambulatory Visit | Attending: Cardiology | Admitting: Cardiology

## 2019-04-17 ENCOUNTER — Other Ambulatory Visit: Payer: Self-pay

## 2019-04-17 ENCOUNTER — Encounter (HOSPITAL_COMMUNITY): Payer: Self-pay | Admitting: Cardiology

## 2019-04-17 VITALS — BP 90/70 | HR 87 | Wt 245.0 lb

## 2019-04-17 DIAGNOSIS — F329 Major depressive disorder, single episode, unspecified: Secondary | ICD-10-CM | POA: Diagnosis not present

## 2019-04-17 DIAGNOSIS — R197 Diarrhea, unspecified: Secondary | ICD-10-CM

## 2019-04-17 DIAGNOSIS — Z7901 Long term (current) use of anticoagulants: Secondary | ICD-10-CM | POA: Insufficient documentation

## 2019-04-17 DIAGNOSIS — K529 Noninfective gastroenteritis and colitis, unspecified: Secondary | ICD-10-CM | POA: Insufficient documentation

## 2019-04-17 DIAGNOSIS — I110 Hypertensive heart disease with heart failure: Secondary | ICD-10-CM | POA: Insufficient documentation

## 2019-04-17 DIAGNOSIS — I428 Other cardiomyopathies: Secondary | ICD-10-CM | POA: Diagnosis not present

## 2019-04-17 DIAGNOSIS — R21 Rash and other nonspecific skin eruption: Secondary | ICD-10-CM | POA: Diagnosis not present

## 2019-04-17 DIAGNOSIS — Z79899 Other long term (current) drug therapy: Secondary | ICD-10-CM | POA: Insufficient documentation

## 2019-04-17 DIAGNOSIS — I5022 Chronic systolic (congestive) heart failure: Secondary | ICD-10-CM | POA: Insufficient documentation

## 2019-04-17 DIAGNOSIS — Z8249 Family history of ischemic heart disease and other diseases of the circulatory system: Secondary | ICD-10-CM | POA: Diagnosis not present

## 2019-04-17 LAB — BASIC METABOLIC PANEL
Anion gap: 10 (ref 5–15)
BUN: 14 mg/dL (ref 6–20)
CO2: 23 mmol/L (ref 22–32)
Calcium: 9.2 mg/dL (ref 8.9–10.3)
Chloride: 102 mmol/L (ref 98–111)
Creatinine, Ser: 1.27 mg/dL — ABNORMAL HIGH (ref 0.61–1.24)
GFR calc Af Amer: >60 mL/min (ref >60)
GFR calc non Af Amer: >60 mL/min (ref >60)
Glucose, Bld: 99 mg/dL (ref 70–99)
Potassium: 4.1 mmol/L (ref 3.5–5.1)
Sodium: 135 mmol/L (ref 135–145)

## 2019-04-17 LAB — DIGOXIN LEVEL: Digoxin Level: 0.6 ng/mL — ABNORMAL LOW (ref 0.8–2.0)

## 2019-04-17 NOTE — Progress Notes (Signed)
Date:  04/17/2019   ID:  [name], DOB Dec 24, 1980, MRN [id number]    Provider location: [address] [place] Type of Visit: Established patient  PCP:  [name], MD  Cardiologist:  No primary care provider on file. Primary HF: Dr. [name]  Chief Complaint: Shortness of breath   History of Present Illness: GABRIELE LOVELAND is a 39 y.o. male  with history of HTN and nonischemic cardiomyopathy who presents for followup of CHF.  He was admitted in 12/17 after 1 month of worsening dyspnea.  He was volume overloaded on exam with EF 10-15% on echo.  He was diuresed and RHC/LHC done.  This showed no CAD. Cardiac output was low.  Cardiac MRI in 4/18 showed severe LV dilation, EF 24%, prominent trabeculation (possible noncompaction), RV insertion LGE (nonspecific).    Echo in 6/20 showed that LV was severely dilated with EF 25-30%.  CPX done in 7/19 surprisingly did not show a significant CHF limitation.  He saw Dr Graciela Husbands recently and ICD was recommended. He wants to get 1 more echo prior to agreeing to ICD.    Weight today is stable.  He is only taking hydralazine bid.  SBP around 90 but he denies lightheadedness.  No chest pain.  No significant exertional dyspnea. No orthopnea/PND.  Still with chronic diarrhea but somewhat improved.  He says that he develops a rash on his hands when he eats dairy or seafood.    Labs (1/18): K 4.3, creatinine 1.47, BNP 243, TSH normal, HIV negative, SPEP negative Labs (4/18): BNP 390, hgb 14.4, K 4.3, creatinine 1.33 Labs (7/18): K 4.3, creatinine 1.4 Labs (2/19): K 3.9, creatinine 1.43, digoxin < 0.2 Labs (5/19): K 4, creatinine 1.21, hgb 15.7, digoxin 0.5 Labs (7/19): HIV negative, digoxin 0.3 Labs (8/19): K 4, creatinine 1.3 Labs (9/19): K 4.1, creatinine 1.33 Labs (1/20): HIV negative, K 3.8, creatinine 1.26, digoxin 0.4 Labs (5/20): hgb 15.7, K 4.2, creatinine 1.22, digoxin 0.7 Labs (9/20): K 4.3, creatinine 1.25, HIV negative,  digoxin 0.8  PMH: 1. HTN 2. Depression 3. Chronic systolic CHF: Nonischemic cardiomyopathy, diagnosed in 12/17.  - Echo (12/17) with EF 10-15%, LV thrombus, moderate MR, PASP 47 mmHg, moderately dilated and dysfunctional RV.  - Echo (7/18) with EF 15%, moderate MR. PASP 40 mm Hg.  - LHC/RHC (12/17): No CAD; mean RA 8, PA 41/22, mean PCWP 20, CI 1.7.  - HIV, SPEP, TSH negative.  - Cardiac MRI in 4/18 showed severe LV dilation, EF 24%, prominent trabeculation (possible noncompaction), RV insertion LGE (nonspecific), no LV thrombus.   - Echo (7/18): EF 15%, severe LV dilation with diffuse HK, grade 3 diastolic dysfunction, mild-moderate MR, PASP 40 mmHg.  - Echo (5/19): EF 25-30%, severe LV dilation with diffuse hypokinesis, mild MR, mild RV dilation with normal systolic function.  - CPX (7/19): RER 1.08, peak VO2 30.2, VE/VCO2 slope 31 => no obvious cardiac limitation.  - Echo (6/20): EF 25-30%, severe LV dilation with diffuse hypokinesis, mild MR, normal RV size and systolic function.  4. LV thrombus: On warfarin.  5. Sleep study with no OSA.   Past Surgical History:  Procedure Laterality Date  . CARDIAC CATHETERIZATION N/A 04/02/2016   Procedure: Right/Left Heart Cath and Coronary Angiography;  Surgeon: Laurey Morale, MD;  Location: [facility] INVASIVE CV LAB;  Service: Cardiovascular;  Laterality: N/A;  . EYE SURGERY     Left   . TOOTH EXTRACTION  Current Outpatient Medications  Medication Sig Dispense Refill  . carvedilol (COREG) 6.25 MG tablet Take 1 tablet (6.25 mg total) by mouth 2 (two) times daily. 60 tablet 6  . digoxin (LANOXIN) 0.125 MG tablet Take 1 tablet (125 mcg total) by mouth daily. 90 tablet 3  . ENTRESTO 24-26 MG TAKE 1 TABLET BY MOUTH TWICE A DAY 60 tablet 6  . furosemide (LASIX) 40 MG tablet Take 1 tablet (40 mg total) by mouth daily. 30 tablet 11  . hydrALAZINE (APRESOLINE) 25 MG tablet TAKE 1 TABLET BY MOUTH THREE TIMES A DAY 270 tablet 1  . isosorbide  mononitrate (IMDUR) 30 MG 24 hr tablet Take 1 tablet (30 mg total) by mouth daily. 30 tablet 5  . KLOR-CON M20 20 MEQ tablet TAKE 1 TABLET (20 MEQ TOTAL) DAILY BY MOUTH. 90 tablet 1  . latanoprost (XALATAN) 0.005 % ophthalmic solution Place 1 drop into both eyes at bedtime.    Marland Kitchen spironolactone (ALDACTONE) 25 MG tablet Take 1 tablet (25 mg total) by mouth daily. 90 tablet 3  . terbinafine (LAMISIL AT) 1 % cream Apply 1 application topically 2 (two) times daily for 14 days. 42 g 0  . warfarin (COUMADIN) 5 MG tablet TAKE BY MOUTH AS DIRECTED BY COUMADIN CLINIC 90 tablet 1   No current facility-administered medications for this encounter.    Allergies:   Hydrocodone   Social History:  The patient  reports that he has never smoked. He has never used smokeless tobacco. He reports that he does not drink alcohol or use drugs.   Family History:  The patient's family history includes Cancer in his father; Heart failure in his mother.   ROS:  Please see the history of present illness.   All other systems are personally reviewed and negative.   Exam:   BP 90/70   Pulse 87   Wt 111.1 kg (245 lb)   SpO2 98%   BMI 34.17 kg/m  General: NAD Neck: No JVD, no thyromegaly or thyroid nodule.  Lungs: Clear to auscultation bilaterally with normal respiratory effort. CV: Nondisplaced PMI.  Heart regular S1/S2, no S3/S4, no murmur.  No peripheral edema.  No carotid bruit.  Normal pedal pulses.  Abdomen: Soft, nontender, no hepatosplenomegaly, no distention.  Skin: Intact without lesions or rashes.  Neurologic: Alert and oriented x 3.  Psych: Normal affect. Extremities: No clubbing or cyanosis.  HEENT: Normal.   Recent Labs: 08/24/2018: Hemoglobin 15.7; Platelets 287 12/15/2018: ALT 25 12/29/2018: B Natriuretic Peptide 53.0 04/17/2019: BUN 14; Creatinine, Ser 1.27; Potassium 4.1; Sodium 135  Personally reviewed   Wt Readings from Last 3 Encounters:  04/17/19 111.1 kg (245 lb)  04/10/19 109 kg (240 lb  6.4 oz)  01/04/19 112.5 kg (248 lb)     ASSESSMENT AND PLAN:  1. Chronic systolic CHF: Nonischemic cardiomyopathy.  EF 10-15% by echo in 12/17.  No coronary disease and low output on cath.  HIV negative, SPEP negative.  No family history of early cardiomyopathy (mother with CHF around 68).  No ETOH/drugs. Possible viral myocarditis.  Cardiac MRI in 4/18 with severe LV dilation, EF 24%, RV insertion site LGE (nonspecific), cannot rule out noncompaction.  Echo in 5/19 showed EF 25-30% with severe LV dilation. CPX in 7/19 was quite good with minimal cardiac limitation.  Echo in 6/20 showed severe LV dilation and EF 25-30%, normal RV systolic function.  NYHA class II symptoms.  Weight stable, he is not volume overloaded on exam.  BP soft  but no lightheadedness, minimal room for med titration.  - Continue Entresto 24/26 bid.  BMET today.  - Continue Coreg 6.25 mg bid.   - Continue digoxin 0.125 and check level.   - Continue spironolactone 25 mg daily.  - Continue current Imdur, and I asked him to take hydralazine tid rather than bid.  - Continue Lasix 40 mg daily.  - With persistently low EF and young age, I have recommended ICD despite nonischemic etiology.  He is not a CRT candidate with relatively narrow QRS. He saw Dr. Graciela Husbands and wants to get 1 more echo prior to deciding on ICD.  I will get echo at followup appt in 3 months.  2. LV thrombus: Not seen on cMRI in 4/18 or echo in 6/20.    - Continue warfarin for anticoagulation  3. GI: Chronic diarrhea, possible food allergies.   - Refer for GI evaluation at patient's request.   Followup with echo in 3 months.   Signed, Marca Ancona, MD  04/17/2019  [facility] [address] [facility] [place] [address] [phone] (office) [phone] (fax)

## 2019-04-17 NOTE — Patient Instructions (Addendum)
INCREASE Hydralazine to three times a day (not twice a day)  You have been referred to the Gastroenterology clinic.  They will call you to schedule an appointment.   Labs today We will only contact you if something comes back abnormal or we need to make some changes. Otherwise no news is good news!  Your physician has requested that you have an echocardiogram. Echocardiography is a painless test that uses sound waves to create images of your heart. It provides your doctor with information about the size and shape of your heart and how well your heart's chambers and valves are working. This procedure takes approximately one hour. There are no restrictions for this procedure.  Your physician recommends that you schedule a follow-up appointment in: 3 months with Dr [name] and an ECHO  Please call office at [phone] option 2 if you have any questions or concerns.   At the [facility], you and your health needs are our priority. As part of our continuing mission to provide you with exceptional heart care, we have created designated Provider Care Teams. These Care Teams include your primary Cardiologist (physician) and Advanced Practice Providers (APPs- Physician Assistants and Nurse Practitioners) who all work together to provide you with the care you need, when you need it.   You may see any of the following providers on your designated Care Team at your next follow up: Marland Kitchen Dr Arvilla Meres . Dr Marca Ancona . Tonye Becket, NP . Robbie Lis, PA . Karle Plumber, PharmD   Please be sure to bring in all your medications bottles to every appointment.

## 2019-04-19 ENCOUNTER — Encounter: Payer: Self-pay | Admitting: Gastroenterology

## 2019-04-19 ENCOUNTER — Ambulatory Visit (INDEPENDENT_AMBULATORY_CARE_PROVIDER_SITE_OTHER): Payer: Medicare Other | Admitting: Gastroenterology

## 2019-04-19 VITALS — BP 100/72 | HR 86 | Temp 98.6°F | Ht 71.0 in | Wt 245.0 lb

## 2019-04-19 DIAGNOSIS — R140 Abdominal distension (gaseous): Secondary | ICD-10-CM

## 2019-04-19 DIAGNOSIS — K529 Noninfective gastroenteritis and colitis, unspecified: Secondary | ICD-10-CM

## 2019-04-19 DIAGNOSIS — E739 Lactose intolerance, unspecified: Secondary | ICD-10-CM | POA: Diagnosis not present

## 2019-04-19 NOTE — Patient Instructions (Addendum)
If you are age 40 or older, your body mass index should be between 23-30. Your Body mass index is 34.17 kg/m. If this is out of the aforementioned range listed, please consider follow up with your Primary Care Provider.  If you are age 23 or younger, your body mass index should be between 19-25. Your Body mass index is 34.17 kg/m. If this is out of the aformentioned range listed, please consider follow up with your Primary Care Provider.   Follow up as needed.   It was a pleasure to see you today!  Dr. Myrtie Neither   Food Guidelines for gas and bloating  Many people have difficulty digesting certain foods, causing a variety of distressing and embarrassing symptoms such as abdominal pain, bloating and gas.  These foods may need to be avoided or consumed in small amounts.  Here are some tips that might be helpful for you.  1.   Lactose intolerance is the difficulty or complete inability to digest lactose, the natural sugar in milk and anything made from milk.  This condition is harmless, common, and can begin any time during life.  Some people can digest a modest amount of lactose while others cannot tolerate any.  Also, not all dairy products contain equal amounts of lactose.  For example, hard cheeses such as parmesan have less lactose than soft cheeses such as cheddar.  Yogurt has less lactose than milk or cheese.  Many packaged foods (even many brands of bread) have milk, so read ingredient lists carefully.  It is difficult to test for lactose intolerance, so just try avoiding lactose as much as possible for a week and see what happens with your symptoms.  If you seem to be lactose intolerant, the best plan is to avoid it (but make sure you get calcium from another source).  The next best thing is to use lactase enzyme supplements, available over the counter everywhere.  Just know that many lactose intolerant people need to take several tablets with each serving of dairy to avoid symptoms.  Lastly, a lot  of restaurant food is made with milk or butter.  Many are things you might not suspect, such as mashed potatoes, rice and pasta (cooked with butter) and "grilled" items.  If you are lactose intolerant, it never hurts to ask your server what has milk or butter.  2.   Fiber is an important part of your diet, but not all fiber is well-tolerated.  Insoluble fiber such as bran is often consumed by normal gut bacteria and converted into gas.  Soluble fiber such as oats, squash, carrots and green beans are typically tolerated better.  3.   Some types of carbohydrates can be poorly digested.  Examples include: fructose (apples, cherries, pears, raisins and other dried fruits), fructans (onions, zucchini, large amounts of wheat), sorbitol/mannitol/xylitol and sucralose/Splenda (common artificial sweeteners), and raffinose (lentils, broccoli, cabbage, asparagus, brussel sprouts, many types of beans).  Do a Programmer, multimedia for [employer] and you will find helpful information. Beano, a dietary supplement, will often help with raffinose-containing foods.  As with lactase tablets, you may need several per serving.  4.   Whenever possible, avoid processed food&meats and chemical additives.  High fructose corn syrup, a common sweetener, may be difficult to digest.  Eggs and soy (comes from the soybean, and added to many foods now) are other common bloating/gassy foods.  - Dr. Sherlynn Carbon [facility]
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
## 2019-04-26 ENCOUNTER — Other Ambulatory Visit (HOSPITAL_COMMUNITY): Payer: Self-pay | Admitting: Cardiology

## 2019-07-07 ENCOUNTER — Other Ambulatory Visit (HOSPITAL_COMMUNITY): Payer: Self-pay | Admitting: Cardiology

## 2019-07-17 ENCOUNTER — Other Ambulatory Visit (HOSPITAL_COMMUNITY)
Admission: RE | Admit: 2019-07-17 | Discharge: 2019-07-17 | Disposition: A | Discharge status: Home / Self Care | Payer: Medicare Other | Source: Ambulatory Visit | Attending: Family Medicine | Admitting: Family Medicine

## 2019-07-17 ENCOUNTER — Encounter (HOSPITAL_COMMUNITY): Payer: Self-pay | Admitting: Cardiology

## 2019-07-17 ENCOUNTER — Other Ambulatory Visit: Payer: Self-pay | Admitting: *Deleted

## 2019-07-17 ENCOUNTER — Other Ambulatory Visit: Payer: Self-pay

## 2019-07-17 ENCOUNTER — Ambulatory Visit (INDEPENDENT_AMBULATORY_CARE_PROVIDER_SITE_OTHER): Payer: Medicare Other | Admitting: *Deleted

## 2019-07-17 ENCOUNTER — Encounter: Payer: Self-pay | Admitting: Family Medicine

## 2019-07-17 ENCOUNTER — Ambulatory Visit (INDEPENDENT_AMBULATORY_CARE_PROVIDER_SITE_OTHER): Payer: Medicare Other | Admitting: Family Medicine

## 2019-07-17 ENCOUNTER — Ambulatory Visit (HOSPITAL_COMMUNITY)
Admission: RE | Admit: 2019-07-17 | Discharge: 2019-07-17 | Disposition: A | Discharge status: Home / Self Care | Payer: Medicare Other | Source: Ambulatory Visit | Attending: Cardiology | Admitting: Cardiology

## 2019-07-17 ENCOUNTER — Ambulatory Visit (HOSPITAL_BASED_OUTPATIENT_CLINIC_OR_DEPARTMENT_OTHER)
Admission: RE | Admit: 2019-07-17 | Discharge: 2019-07-17 | Disposition: A | Discharge status: Home / Self Care | Payer: Medicare Other | Source: Ambulatory Visit | Attending: Cardiology | Admitting: Cardiology

## 2019-07-17 VITALS — BP 114/82 | HR 79 | Wt 250.6 lb

## 2019-07-17 DIAGNOSIS — I428 Other cardiomyopathies: Secondary | ICD-10-CM | POA: Diagnosis not present

## 2019-07-17 DIAGNOSIS — Z8249 Family history of ischemic heart disease and other diseases of the circulatory system: Secondary | ICD-10-CM | POA: Diagnosis not present

## 2019-07-17 DIAGNOSIS — I110 Hypertensive heart disease with heart failure: Secondary | ICD-10-CM | POA: Insufficient documentation

## 2019-07-17 DIAGNOSIS — Z91013 Allergy to seafood: Secondary | ICD-10-CM | POA: Diagnosis not present

## 2019-07-17 DIAGNOSIS — Z7901 Long term (current) use of anticoagulants: Secondary | ICD-10-CM

## 2019-07-17 DIAGNOSIS — Z114 Encounter for screening for human immunodeficiency virus [HIV]: Secondary | ICD-10-CM | POA: Diagnosis not present

## 2019-07-17 DIAGNOSIS — I5022 Chronic systolic (congestive) heart failure: Secondary | ICD-10-CM

## 2019-07-17 DIAGNOSIS — Z885 Allergy status to narcotic agent status: Secondary | ICD-10-CM | POA: Diagnosis not present

## 2019-07-17 DIAGNOSIS — Z113 Encounter for screening for infections with a predominantly sexual mode of transmission: Secondary | ICD-10-CM | POA: Diagnosis not present

## 2019-07-17 DIAGNOSIS — Z79899 Other long term (current) drug therapy: Secondary | ICD-10-CM | POA: Insufficient documentation

## 2019-07-17 DIAGNOSIS — Z86718 Personal history of other venous thrombosis and embolism: Secondary | ICD-10-CM | POA: Diagnosis not present

## 2019-07-17 DIAGNOSIS — A530 Latent syphilis, unspecified as early or late: Secondary | ICD-10-CM | POA: Diagnosis not present

## 2019-07-17 DIAGNOSIS — I240 Acute coronary thrombosis not resulting in myocardial infarction: Secondary | ICD-10-CM | POA: Diagnosis not present

## 2019-07-17 LAB — BASIC METABOLIC PANEL
Anion gap: 8 (ref 5–15)
BUN: 12 mg/dL (ref 6–20)
CO2: 24 mmol/L (ref 22–32)
Calcium: 9.4 mg/dL (ref 8.9–10.3)
Chloride: 105 mmol/L (ref 98–111)
Creatinine, Ser: 1.15 mg/dL (ref 0.61–1.24)
GFR calc Af Amer: >60 mL/min (ref >60)
GFR calc non Af Amer: >60 mL/min (ref >60)
Glucose, Bld: 117 mg/dL — ABNORMAL HIGH (ref 70–99)
Potassium: 4 mmol/L (ref 3.5–5.1)
Sodium: 137 mmol/L (ref 135–145)

## 2019-07-17 LAB — POCT INR: INR: 2.8 (ref 2.0–3.0)

## 2019-07-17 LAB — DIGOXIN LEVEL: Digoxin Level: 0.6 ng/mL — ABNORMAL LOW (ref 0.8–2.0)

## 2019-07-17 MED ORDER — SACUBITRIL-VALSARTAN 49-51 MG PO TABS: 1.0000 | ORAL_TABLET | Freq: Two times a day (BID) | ORAL | 5 refills | Status: DC

## 2019-07-17 MED ORDER — WARFARIN SODIUM 5 MG PO TABS: ORAL_TABLET | ORAL | 1 refills | Status: DC

## 2019-07-17 MED ORDER — FUROSEMIDE 20 MG PO TABS: 20.0000 mg | ORAL_TABLET | Freq: Every day | ORAL | 5 refills | Status: DC

## 2019-07-17 NOTE — Progress Notes (Signed)
  Echocardiogram 2D Echocardiogram has been performed.  Leta Jungling M 07/17/2019, 11:03 AM

## 2019-07-17 NOTE — Progress Notes (Signed)
Date:  07/17/2019   ID:  Kenneth Robertson, DOB 11-Aug-1980, MRN [id number]    Provider location: [address], [place] Type of Visit: Established patient  PCP:  [name], DO  Cardiologist:  No primary care provider on file. Primary HF: Dr. [name]   History of Present Illness: Kenneth Robertson is a 39 y.o. male  with history of HTN and nonischemic cardiomyopathy who presents for followup of CHF.  He was admitted in 12/17 after 1 month of worsening dyspnea.  He was volume overloaded on exam with EF 10-15% on echo.  He was diuresed and RHC/LHC done.  This showed no CAD. Cardiac output was low.  Cardiac MRI in 4/18 showed severe LV dilation, EF 24%, prominent trabeculation (possible noncompaction), RV insertion LGE (nonspecific).    Echo in 6/20 showed that LV was severely dilated with EF 25-30%.  CPX done in 7/19 surprisingly did not show a significant CHF limitation.  He saw Dr Graciela Husbands and ICD was recommended but he deferred.   Echo was done today and reviewed, EF 35-40%, diffuse hypokinesis, normal RV.    Weight is up 5 lbs today.  He has been doing well symptomatically.  No significant exertional dyspnea.  Not getting much exercise though.  No lightheadedness.  No orthopnea/PND.  He is taking all his meds now, rarely misses doses.   Labs (7/18): K 4.3, creatinine 1.4 Labs (2/19): K 3.9, creatinine 1.43, digoxin < 0.2 Labs (5/19): K 4, creatinine 1.21, hgb 15.7, digoxin 0.5 Labs (7/19): HIV negative, digoxin 0.3 Labs (8/19): K 4, creatinine 1.3 Labs (9/19): K 4.1, creatinine 1.33 Labs (1/20): HIV negative, K 3.8, creatinine 1.26, digoxin 0.4 Labs (5/20): hgb 15.7, K 4.2, creatinine 1.22, digoxin 0.7 Labs (9/20): K 4.3, creatinine 1.25, HIV negative, digoxin 0.8 Labs (1/21): K 4.1, creatinine 1.27, digoxin 0.6  PMH: 1. HTN 2. Depression 3. Chronic systolic CHF: Nonischemic cardiomyopathy, diagnosed in 12/17.  - Echo (12/17) with EF 10-15%, LV thrombus, moderate  MR, PASP 47 mmHg, moderately dilated and dysfunctional RV.  - Echo (7/18) with EF 15%, moderate MR. PASP 40 mm Hg.  - LHC/RHC (12/17): No CAD; mean RA 8, PA 41/22, mean PCWP 20, CI 1.7.  - HIV, SPEP, TSH negative.  - Cardiac MRI in 4/18 showed severe LV dilation, EF 24%, prominent trabeculation (possible noncompaction), RV insertion LGE (nonspecific), no LV thrombus.   - Echo (7/18): EF 15%, severe LV dilation with diffuse HK, grade 3 diastolic dysfunction, mild-moderate MR, PASP 40 mmHg.  - Echo (5/19): EF 25-30%, severe LV dilation with diffuse hypokinesis, mild MR, mild RV dilation with normal systolic function.  - CPX (7/19): RER 1.08, peak VO2 30.2, VE/VCO2 slope 31 => no obvious cardiac limitation.  - Echo (6/20): EF 25-30%, severe LV dilation with diffuse hypokinesis, mild MR, normal RV size and systolic function.  - Echo (4/21): EF 35-40%, diffuse hypokinesis, normal RV size and systolic function.  4. LV thrombus: On warfarin.  5. Sleep study with no OSA.   Past Surgical History:  Procedure Laterality Date  . CARDIAC CATHETERIZATION N/A 04/02/2016   Procedure: Right/Left Heart Cath and Coronary Angiography;  Surgeon: Laurey Morale, MD;  Location: [facility];  Service: Cardiovascular;  Laterality: N/A;  . EYE SURGERY     Left   . TOOTH EXTRACTION       Current Outpatient Medications  Medication Sig Dispense Refill  . carvedilol (COREG) 6.25 MG tablet TAKE 1  TABLET BY MOUTH TWICE A DAY 180 tablet 3  . digoxin (LANOXIN) 0.125 MG tablet Take 1 tablet (125 mcg total) by mouth daily. 90 tablet 3  . furosemide (LASIX) 20 MG tablet Take 1 tablet (20 mg total) by mouth daily. 30 tablet 5  . hydrALAZINE (APRESOLINE) 25 MG tablet TAKE 1 TABLET BY MOUTH THREE TIMES A DAY 270 tablet 1  . isosorbide mononitrate (IMDUR) 30 MG 24 hr tablet TAKE 1 TABLET BY MOUTH EVERY DAY 90 tablet 1  . KLOR-CON M20 20 MEQ tablet TAKE 1 TABLET (20 MEQ TOTAL) DAILY BY MOUTH. 90 tablet 1  . latanoprost  (XALATAN) 0.005 % ophthalmic solution Place 1 drop into both eyes at bedtime.    Marland Kitchen spironolactone (ALDACTONE) 25 MG tablet Take 1 tablet (25 mg total) by mouth daily. 90 tablet 3  . sacubitril-valsartan (ENTRESTO) 49-51 MG Take 1 tablet by mouth 2 (two) times daily. 60 tablet 5  . warfarin (COUMADIN) 5 MG tablet Take 10 mg (2 tabs) on Sun and Tues Take 5 mg (1 tab) on Mon, Wed, Thurs, Fri, Sat 60 tablet 1   No current facility-administered medications for this encounter.    Allergies:   Hydrocodone and Shrimp [shellfish allergy]   Social History:  The patient  reports that he has never smoked. He has never used smokeless tobacco. He reports that he does not drink alcohol or use drugs.   Family History:  The patient's family history includes Cancer in his father; Heart failure in his mother.   ROS:  Please see the history of present illness.   All other systems are personally reviewed and negative.   Exam:   BP 114/82   Pulse 79   Wt 113.7 kg (250 lb 9.6 oz)   SpO2 98%   BMI 34.95 kg/m  General: NAD Neck: No JVD, no thyromegaly or thyroid nodule.  Lungs: Clear to auscultation bilaterally with normal respiratory effort. CV: Nondisplaced PMI.  Heart regular S1/S2, no S3/S4, no murmur.  No peripheral edema.  No carotid bruit.  Normal pedal pulses.  Abdomen: Soft, nontender, no hepatosplenomegaly, no distention.  Skin: Intact without lesions or rashes.  Neurologic: Alert and oriented x 3.  Psych: Normal affect. Extremities: No clubbing or cyanosis.  HEENT: Normal.   Recent Labs: 08/24/2018: Hemoglobin 15.7; Platelets 287 12/15/2018: ALT 25 12/29/2018: B Natriuretic Peptide 53.0 07/17/2019: BUN 12; Creatinine, Ser 1.15; Potassium 4.0; Sodium 137  Personally reviewed   Wt Readings from Last 3 Encounters:  07/17/19 114.2 kg (251 lb 12.8 oz)  07/17/19 113.7 kg (250 lb 9.6 oz)  04/19/19 111.1 kg (245 lb)     ASSESSMENT AND PLAN:  1. Chronic systolic CHF: Nonischemic  cardiomyopathy.  EF 10-15% by echo in 12/17.  No coronary disease and low output on cath.  HIV negative, SPEP negative.  No family history of early cardiomyopathy (mother with CHF around 18).  No ETOH/drugs. Possible viral myocarditis.  Cardiac MRI in 4/18 with severe LV dilation, EF 24%, RV insertion site LGE (nonspecific), cannot rule out noncompaction.  Echo in 5/19 showed EF 25-30% with severe LV dilation. CPX in 7/19 was quite good with minimal cardiac limitation.  Echo in 6/20 showed severe LV dilation and EF 25-30%, normal RV systolic function.  Echo was done today and reviewed, EF improved to 35-40%.  He is doing better about taking his medications regularly, which may be helping.  NYHA class II symptoms.  He is not volume overloaded on exam.   -  Increase Entresto to 49/51 bid and decrease Lasix to 20 mg daily.  BMET today and again in 10 days.  - Continue Coreg 6.25 mg bid.   - Continue digoxin 0.125 and check level.   - Continue spironolactone 25 mg daily.  - Continue current hydralazine/Imdur.  - I think that EF is currently outside of ICD range.  He is not a CRT candidate with relatively narrow QRS. Hopefully, if he continues to be compliant with meds, LV function will continue to improve.  2. LV thrombus: Not seen on more recent studies.    - Continue warfarin for anticoagulation   Followup with HF pharmacist in 3 wks for medication titration and see me in 2 months.   Signed, Loralie Champagne, MD  07/17/2019  [facility] [address] [facility] [place] [address] [phone] (office) [phone] (fax)

## 2019-07-17 NOTE — Patient Instructions (Signed)
Kenneth Robertson, Minor you for coming to see me today.  Today we did a full STD screen.  I will call you with the results.  We also gave you hepatitis B and C vaccinations.  Please follow-up with myself or your PCP in 1 to 2 weeks for your mood.  I would like to check in with you.  Best wishes,  Dr Allena Katz

## 2019-07-17 NOTE — Patient Instructions (Signed)
INCREASE Entresto to 48/51mg  (1 tab) twice a day   DECREASE Lasix to 20mg  (1 tab) twice a day   Labs today We will only contact you if something comes back abnormal or we need to make some changes. Otherwise no news is good news!   Your physician recommends that you schedule a follow-up appointment in: 3 weeks with the Pharmacist and 2 months with Dr .   Please call office at [phone] option [phone] if you have any questions or concerns.   At the [facility], you and your health needs are our priority. As part of our continuing mission to provide you with exceptional heart care, we have created designated Provider Care Teams. These Care Teams include your primary Cardiologist (physician) and Advanced Practice Providers (APPs- Physician Assistants and Nurse Practitioners) who all work together to provide you with the care you need, when you need it.   You may see any of the following providers on your designated Care Team at your next follow up: [phone] Dr Marland Kitchen . Dr Arvilla Meres . Marca Ancona, NP . Tonye Becket, PA . Robbie Lis, PharmD   Please be sure to bring in all your medications bottles to every appointment.

## 2019-07-18 LAB — CERVICOVAGINAL ANCILLARY ONLY
Chlamydia: NEGATIVE
Chlamydia: NEGATIVE
Comment: NEGATIVE
Comment: NEGATIVE
Comment: NORMAL
Comment: NORMAL
Neisseria Gonorrhea: NEGATIVE
Neisseria Gonorrhea: NEGATIVE

## 2019-07-18 LAB — URINE CYTOLOGY ANCILLARY ONLY
Chlamydia: NEGATIVE
Comment: NEGATIVE
Comment: NORMAL
Neisseria Gonorrhea: NEGATIVE

## 2019-07-19 ENCOUNTER — Telehealth: Payer: Self-pay

## 2019-07-19 LAB — HIV ANTIBODY (ROUTINE TESTING W REFLEX): HIV Screen 4th Generation wRfx: NONREACTIVE

## 2019-07-19 LAB — RPR, QUANT+TP ABS (REFLEX)
Rapid Plasma Reagin, Quant: 1:256 {titer} — ABNORMAL HIGH
T Pallidum Abs: REACTIVE — AB

## 2019-07-19 LAB — RPR: RPR Ser Ql: REACTIVE — AB

## 2019-07-19 NOTE — Telephone Encounter (Signed)
Cory, with [facility], LVM on nurse line confirming of positive RPR result for patient on 4/13. Denyse Amass attempting to confirm if the patient has been notified and treatment plan. Please advise. Will forward to PCP and ordering provider.

## 2019-07-20 ENCOUNTER — Ambulatory Visit (INDEPENDENT_AMBULATORY_CARE_PROVIDER_SITE_OTHER): Payer: Medicare Other

## 2019-07-20 ENCOUNTER — Other Ambulatory Visit: Payer: Self-pay | Admitting: Family Medicine

## 2019-07-20 ENCOUNTER — Other Ambulatory Visit: Payer: Self-pay

## 2019-07-20 ENCOUNTER — Telehealth: Payer: Self-pay | Admitting: Family Medicine

## 2019-07-20 DIAGNOSIS — A530 Latent syphilis, unspecified as early or late: Secondary | ICD-10-CM

## 2019-07-20 MED ORDER — PENICILLIN G BENZATHINE 1200000 UNIT/2ML IM SUSP
2.4000 10*6.[IU] | Freq: Once | INTRAMUSCULAR | Status: AC
  Administered 2019-07-20: 2.4 10*6.[IU] via INTRAMUSCULAR

## 2019-07-20 NOTE — Telephone Encounter (Signed)
Contacted patient to inform him that he has tested positive for Syphilis based on his labs earlier this week. I explained to him what syphillis is and the treatment required. Recommended that he come into today urgently for labs and treatment. Pt said he is on his way to the clinic now. Ruthy Dick and Dr Pollie Meyer are aware of this patient. Also recommended follow up with me in the next few weeks.   Towanda Octave PGY-1, [facility]

## 2019-07-20 NOTE — Progress Notes (Signed)
Patient presents in nurse clinic for Syphilis treatment. Patient had labs drawn before treatment, per MD. Patient received 2.4 units of Penicillin. 1.2 units in RUOQ and 1.2 units in LUOQ.  Patient tolerated well. Patient was counseled on safe sex and given a bag of condoms. Patient advised he would get a call from [place] regarding today's labs.

## 2019-07-21 DIAGNOSIS — A530 Latent syphilis, unspecified as early or late: Secondary | ICD-10-CM | POA: Insufficient documentation

## 2019-07-21 NOTE — Assessment & Plan Note (Addendum)
Routine STD testing in clinic today including HIV, gonorrhea, chlamydia including oropharyngeal and rectal swabs. HIV,GC/Chlamydia negative. Received Hep A and B vaccine today.  RPR reactive, quant 1:2 and T pallidum Abs reactive, given that patient was negative 7 months and asymptomatic when testing positive, fits criteria of early latent syphilis. Patient contacted to come into clinic for repeat syphillis screening prior to Benzyl pencilin treatment on 4/16. Counseling provided on safe sex with barrier methods and have his sexual partner tested for STDs. -Follow up with me in the next 1-2 weeks for further counseling on safe sex -Repeat serology in 6-12 months or sooner if symptoms arise.

## 2019-07-21 NOTE — Progress Notes (Addendum)
    SUBJECTIVE:   CHIEF COMPLAINT / HPI:   Kenneth Robertson is a 39 yr old male who presents today for a STD check  Sexual history Is sexually active with males only. Has a long term male partner whom he is faithful with. Denies multiple sexual partners. Has regular unprotected sexual intercourse: anal and oral. Was last sexually active 2 weeks ago. Denies dysuria, penile discharge or penile lesions or lesions elsehwhere. In August 2020 tested negative for HIV, gonorrhea, chlamydia and hepatitis B.  PERTINENT  PMH / PSH: Nonischemic cardiomyopathy, congestive heart failure   OBJECTIVE:   BP 120/78   Pulse 78   Ht 5\' 11"  (1.803 m)   Wt 114.2 kg   SpO2 99%   BMI 35.12 kg/m   General: Pleasant, well appearing 39 yr old male Cardio: Normal S1 and S2, RRR   Pulm: CTAB, normal WOB  Abdomen: Bowel sounds normal. Abdomen soft and non-tender.  Extremities: No peripheral edema. Warm/ well perfused.   Neuro: Cranial nerves grossly intact  Oropharyngeal and rectal swabs for GC/Chlamydia. Chaperoned by CMA Sharie.  ASSESSMENT/PLAN:   Latent syphilis in male Routine STD testing in clinic today including HIV, gonorrhea, chlamydia including oropharyngeal and rectal swabs. HIV,GC/Chlamydia negative. Received Hep A and B vaccine today.  RPR reactive, quant 1:2 and T pallidum Abs reactive, given that patient was negative 7 months and asymptomatic when testing positive, fits criteria of early latent syphilis. Patient contacted to come into clinic for repeat syphillis screening prior to Benzyl pencilin treatment on 4/16. Counseling provided on safe sex with barrier methods and have his sexual partner tested for STDs. -Follow up with me in the next 1-2 weeks for further counseling on safe sex -Repeat serology in 6-12 months or sooner if symptoms arise.    8-12, MD [facility]

## 2019-07-24 LAB — RPR: RPR Ser Ql: REACTIVE — AB

## 2019-07-24 LAB — RPR, QUANT+TP ABS (REFLEX)
Rapid Plasma Reagin, Quant: 1:64 {titer} — ABNORMAL HIGH
T Pallidum Abs: REACTIVE — AB

## 2019-07-24 LAB — T.PALLIDUM AB, TOTAL: T Pallidum Abs: REACTIVE — AB

## 2019-07-27 ENCOUNTER — Telehealth: Payer: Self-pay

## 2019-07-27 DIAGNOSIS — A530 Latent syphilis, unspecified as early or late: Secondary | ICD-10-CM

## 2019-07-27 NOTE — Telephone Encounter (Signed)
Patient comes into office stating that health department advised that he come to office for further STI treatment. Patient treated last week with 2.4 g penicillin for syphilis.   Spoke with Erie Noe at the health department, who stated that they are recommending that patient receive the three week treatment. 2.4 g penicillin IM once weekly for three weeks. Erie Noe can be reached at [phone] with further questions.   Please advise if patient should receive this treatment and if orders can be placed to reflect this treatment.   To PCP and Levada Dy, RN

## 2019-07-29 MED ORDER — PENICILLIN G BENZATHINE 1200000 UNIT/2ML IM SUSP
2.4000 10*6.[IU] | INTRAMUSCULAR | Status: AC
  Administered 2019-07-31 – 2019-08-07 (×2): 2.4 10*6.[IU] via INTRAMUSCULAR

## 2019-07-29 NOTE — Telephone Encounter (Signed)
Spoke with patient to let him know that he will need two additional doses of PCN for the treatment of syphilis. Per the health department, patient to recieve three week treatment. 2.4 g penicillin IM once weekly.  Patient agreeable and will await call for nursing staff to schedule his additional antibiotics.   Dr. Rachael Darby

## 2019-07-30 NOTE — Telephone Encounter (Signed)
Called patient and left HIPPA compliant VM for patient to return phone call to office to schedule nurse visit.   Veronda Prude, RN

## 2019-07-31 ENCOUNTER — Ambulatory Visit (INDEPENDENT_AMBULATORY_CARE_PROVIDER_SITE_OTHER): Payer: Medicare Other

## 2019-07-31 ENCOUNTER — Other Ambulatory Visit: Payer: Self-pay

## 2019-07-31 DIAGNOSIS — A530 Latent syphilis, unspecified as early or late: Secondary | ICD-10-CM

## 2019-07-31 NOTE — Progress Notes (Signed)
Patient presents in nurse clinic for Syphilis treatment 2 of 4. Patient received 2.4 units of Penicillin. 1.2 units in RUOQ and 1.2 units in LUOQ.  Patient tolerated well. Patient to return next week at scheduled time for treatment #3.

## 2019-08-02 DIAGNOSIS — H401213 Low-tension glaucoma, right eye, severe stage: Secondary | ICD-10-CM | POA: Diagnosis not present

## 2019-08-03 ENCOUNTER — Other Ambulatory Visit: Payer: Self-pay

## 2019-08-03 ENCOUNTER — Ambulatory Visit (INDEPENDENT_AMBULATORY_CARE_PROVIDER_SITE_OTHER): Payer: Medicare Other | Admitting: Family Medicine

## 2019-08-03 ENCOUNTER — Encounter: Payer: Self-pay | Admitting: Family Medicine

## 2019-08-03 VITALS — BP 102/62 | HR 75 | Wt 254.4 lb

## 2019-08-03 DIAGNOSIS — F339 Major depressive disorder, recurrent, unspecified: Secondary | ICD-10-CM

## 2019-08-03 NOTE — Progress Notes (Signed)
    SUBJECTIVE:   CHIEF COMPLAINT / HPI:   Patient was here for follow up for depression however left prior to being seen. Reported that he had to go shortly after getting his vitals taken.   PERTINENT  PMH / PSH: hx of depression  OBJECTIVE:   BP 102/62   Pulse 75   Wt 254 lb 6.4 oz (115.4 kg)   SpO2 97%   BMI 35.48 kg/m   Not seen  ASSESSMENT/PLAN:   No problem-specific Assessment & Plan notes found for this encounter.     [name], DO [facility]

## 2019-08-07 ENCOUNTER — Other Ambulatory Visit: Payer: Self-pay

## 2019-08-07 ENCOUNTER — Ambulatory Visit (INDEPENDENT_AMBULATORY_CARE_PROVIDER_SITE_OTHER): Payer: Medicare Other

## 2019-08-07 DIAGNOSIS — A530 Latent syphilis, unspecified as early or late: Secondary | ICD-10-CM | POA: Diagnosis not present

## 2019-08-07 MED ORDER — PENICILLIN G BENZATHINE 1200000 UNIT/2ML IM SUSP: 2.4000 10*6.[IU] | Freq: Once | INTRAMUSCULAR | Status: DC

## 2019-08-07 NOTE — Progress Notes (Signed)
Patient reports to nurse clinic for syphilis treatment 3 of 3. Patient received 2.4 units of Penicillin. 1.2 units in RUOQ and 1.2 units in LUOQ. Patient tolerated well.   Instructed patient to return for f/u in 2-3 months for TOC.   Pt observed following administration. No reaction noted.   Veronda Prude, RN

## 2019-08-09 ENCOUNTER — Inpatient Hospital Stay (HOSPITAL_COMMUNITY)
Admission: RE | Admit: 2019-08-09 | Discharge: 2019-08-09 | Disposition: A | Discharge status: Home / Self Care | Payer: Medicare Other | Source: Ambulatory Visit

## 2019-08-11 ENCOUNTER — Other Ambulatory Visit (HOSPITAL_COMMUNITY): Payer: Self-pay | Admitting: Cardiology

## 2019-08-11 ENCOUNTER — Other Ambulatory Visit: Payer: Self-pay | Admitting: Family Medicine

## 2019-08-16 ENCOUNTER — Ambulatory Visit: Payer: Medicare Other | Admitting: Family Medicine

## 2019-08-21 ENCOUNTER — Other Ambulatory Visit (HOSPITAL_COMMUNITY): Payer: Self-pay | Admitting: Cardiology

## 2019-08-22 DIAGNOSIS — H401133 Primary open-angle glaucoma, bilateral, severe stage: Secondary | ICD-10-CM | POA: Diagnosis not present

## 2019-08-23 ENCOUNTER — Telehealth: Payer: Medicare Other | Admitting: Family Medicine

## 2019-08-23 ENCOUNTER — Other Ambulatory Visit: Payer: Self-pay

## 2019-08-25 ENCOUNTER — Other Ambulatory Visit (HOSPITAL_COMMUNITY): Payer: Self-pay | Admitting: Cardiology

## 2019-08-29 ENCOUNTER — Other Ambulatory Visit: Payer: Self-pay

## 2019-08-29 ENCOUNTER — Telehealth (INDEPENDENT_AMBULATORY_CARE_PROVIDER_SITE_OTHER): Payer: Medicare Other | Admitting: Family Medicine

## 2019-08-29 DIAGNOSIS — Z91199 Patient's noncompliance with other medical treatment and regimen due to unspecified reason: Secondary | ICD-10-CM

## 2019-08-29 DIAGNOSIS — Z5329 Procedure and treatment not carried out because of patient's decision for other reasons: Secondary | ICD-10-CM

## 2019-08-29 NOTE — Progress Notes (Signed)
Called patient for pre-visit screening. Phone rang, no answer, no ability to leave VM. Will attempt to call again later.   Veronda Prude, RN

## 2019-08-29 NOTE — Progress Notes (Signed)
Patient called x2. HIPAA compliant voicemail left to return my call at the clinic.    [name], DO PGY-1, [facility] 08/29/2019 4:22 PM

## 2019-09-05 ENCOUNTER — Inpatient Hospital Stay (HOSPITAL_COMMUNITY)
Admission: RE | Admit: 2019-09-05 | Discharge: 2019-09-05 | Disposition: A | Discharge status: Home / Self Care | Payer: Medicare Other | Source: Ambulatory Visit

## 2019-09-05 NOTE — Patient Instructions (Addendum)
It was a pleasure seeing you today!  MEDICATIONS: -We are changing your medications today -Start dapagliflozin (Farxiga) 10 mg daily -Call if you have questions about your medications.  LABS: -We will call you if your labs need attention.  NEXT APPOINTMENT: Return to clinic in 3 weeks with Heart failure clinic pharamcist.  In general, to take care of your heart failure: -Limit your fluid intake to 2 Liters (half-gallon) per day.   -Limit your salt intake to ideally 2-3 grams (2000-3000 mg) per day. -Weigh yourself daily and record, and bring that "weight diary" to your next appointment.  (Weight gain of 2-3 pounds in 1 day typically means fluid weight.) -The medications for your heart are to help your heart and help you live longer.   -Please contact us before stopping any of your heart medications.  Call the clinic at [phone] with questions or to reschedule future appointments.

## 2019-09-07 ENCOUNTER — Other Ambulatory Visit (HOSPITAL_COMMUNITY): Payer: Self-pay | Admitting: Cardiology

## 2019-09-25 ENCOUNTER — Telehealth (HOSPITAL_COMMUNITY): Payer: Self-pay | Admitting: *Deleted

## 2019-09-25 NOTE — Telephone Encounter (Signed)
Pt called stating higher dose of entresto was causing headaches, dizziness and over all fatigue.  Pt wants to know if he can decrease medication.  Routed to Dr.McLean for advice

## 2019-09-25 NOTE — Telephone Encounter (Signed)
He can decrease back to 24/26 bid.

## 2019-09-26 MED ORDER — ENTRESTO 24-26 MG PO TABS: 1.0000 | ORAL_TABLET | Freq: Two times a day (BID) | ORAL | 3 refills | Status: DC

## 2019-09-26 NOTE — Telephone Encounter (Signed)
Pt aware and verbalized understanding.  

## 2019-09-29 ENCOUNTER — Other Ambulatory Visit (HOSPITAL_COMMUNITY): Payer: Self-pay | Admitting: Cardiology

## 2019-10-12 ENCOUNTER — Encounter (HOSPITAL_COMMUNITY): Payer: Medicare Other | Admitting: Cardiology

## 2019-11-13 ENCOUNTER — Other Ambulatory Visit (HOSPITAL_COMMUNITY): Payer: Self-pay | Admitting: Cardiology

## 2019-11-27 ENCOUNTER — Other Ambulatory Visit (HOSPITAL_COMMUNITY): Payer: Self-pay | Admitting: Cardiology

## 2019-12-12 ENCOUNTER — Ambulatory Visit (HOSPITAL_COMMUNITY)
Admission: EM | Admit: 2019-12-12 | Discharge: 2019-12-12 | Disposition: A | Discharge status: Home / Self Care | Payer: Medicare Other | Attending: Family Medicine | Admitting: Family Medicine

## 2019-12-12 ENCOUNTER — Other Ambulatory Visit: Payer: Self-pay

## 2019-12-12 ENCOUNTER — Encounter (HOSPITAL_COMMUNITY): Payer: Self-pay

## 2019-12-12 DIAGNOSIS — J029 Acute pharyngitis, unspecified: Secondary | ICD-10-CM

## 2019-12-12 DIAGNOSIS — Z885 Allergy status to narcotic agent status: Secondary | ICD-10-CM | POA: Insufficient documentation

## 2019-12-12 DIAGNOSIS — Z7901 Long term (current) use of anticoagulants: Secondary | ICD-10-CM | POA: Diagnosis not present

## 2019-12-12 DIAGNOSIS — K219 Gastro-esophageal reflux disease without esophagitis: Secondary | ICD-10-CM | POA: Diagnosis not present

## 2019-12-12 DIAGNOSIS — I110 Hypertensive heart disease with heart failure: Secondary | ICD-10-CM | POA: Insufficient documentation

## 2019-12-12 DIAGNOSIS — Z79899 Other long term (current) drug therapy: Secondary | ICD-10-CM | POA: Insufficient documentation

## 2019-12-12 DIAGNOSIS — Z8249 Family history of ischemic heart disease and other diseases of the circulatory system: Secondary | ICD-10-CM | POA: Diagnosis not present

## 2019-12-12 DIAGNOSIS — Z20822 Contact with and (suspected) exposure to covid-19: Secondary | ICD-10-CM | POA: Diagnosis not present

## 2019-12-12 DIAGNOSIS — I5022 Chronic systolic (congestive) heart failure: Secondary | ICD-10-CM | POA: Insufficient documentation

## 2019-12-12 MED ORDER — LIDOCAINE VISCOUS HCL 2 % MT SOLN: 10.0000 mL | OROMUCOSAL | 0 refills | Status: DC | PRN

## 2019-12-12 NOTE — ED Triage Notes (Signed)
Pt presents with sore throat X 2 days. 
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
## 2019-12-13 LAB — SARS CORONAVIRUS 2 (TAT 6-24 HRS): SARS Coronavirus 2: NEGATIVE

## 2019-12-14 ENCOUNTER — Ambulatory Visit: Payer: Medicare Other

## 2019-12-17 ENCOUNTER — Ambulatory Visit (INDEPENDENT_AMBULATORY_CARE_PROVIDER_SITE_OTHER): Payer: Medicare Other | Admitting: Family Medicine

## 2019-12-17 DIAGNOSIS — Z91199 Patient's noncompliance with other medical treatment and regimen due to unspecified reason: Secondary | ICD-10-CM | POA: Insufficient documentation

## 2019-12-17 DIAGNOSIS — Z5329 Procedure and treatment not carried out because of patient's decision for other reasons: Secondary | ICD-10-CM

## 2019-12-17 NOTE — Progress Notes (Addendum)
Patient no-showed for appt today 12/17/2019. Also no showed appt 12/14/2019, 08/29/2019, 08/23/2019, and 08/16/2019.   Called the patient at [phone], was unable to leave a voicemail as mailbox had not been set up yet.   Sent the patient a letter at home.    Peggyann Shoals, DO [facility], PGY-3 12/17/2019 3:57 PM

## 2019-12-18 NOTE — Addendum Note (Signed)
Addended by: Peggyann Shoals C on: 12/18/2019 01:02 PM   Modules accepted: Level of Service

## 2020-01-30 ENCOUNTER — Ambulatory Visit (HOSPITAL_COMMUNITY)
Admission: RE | Admit: 2020-01-30 | Discharge: 2020-01-30 | Disposition: A | Discharge status: Home / Self Care | Payer: Medicare Other | Source: Ambulatory Visit | Attending: Cardiology | Admitting: Cardiology

## 2020-01-30 ENCOUNTER — Encounter (HOSPITAL_COMMUNITY): Payer: Self-pay

## 2020-01-30 ENCOUNTER — Other Ambulatory Visit: Payer: Self-pay

## 2020-01-30 VITALS — BP 118/74 | HR 88 | Wt 258.2 lb

## 2020-01-30 DIAGNOSIS — I428 Other cardiomyopathies: Secondary | ICD-10-CM | POA: Diagnosis not present

## 2020-01-30 DIAGNOSIS — Z7901 Long term (current) use of anticoagulants: Secondary | ICD-10-CM | POA: Insufficient documentation

## 2020-01-30 DIAGNOSIS — Z79899 Other long term (current) drug therapy: Secondary | ICD-10-CM | POA: Insufficient documentation

## 2020-01-30 DIAGNOSIS — Z8249 Family history of ischemic heart disease and other diseases of the circulatory system: Secondary | ICD-10-CM | POA: Diagnosis not present

## 2020-01-30 DIAGNOSIS — E785 Hyperlipidemia, unspecified: Secondary | ICD-10-CM

## 2020-01-30 DIAGNOSIS — I110 Hypertensive heart disease with heart failure: Secondary | ICD-10-CM | POA: Diagnosis not present

## 2020-01-30 DIAGNOSIS — I5022 Chronic systolic (congestive) heart failure: Secondary | ICD-10-CM | POA: Diagnosis not present

## 2020-01-30 LAB — BASIC METABOLIC PANEL
Anion gap: 9 (ref 5–15)
BUN: 12 mg/dL (ref 6–20)
CO2: 25 mmol/L (ref 22–32)
Calcium: 9.2 mg/dL (ref 8.9–10.3)
Chloride: 101 mmol/L (ref 98–111)
Creatinine, Ser: 1.22 mg/dL (ref 0.61–1.24)
GFR, Estimated: >60 mL/min (ref >60)
Glucose, Bld: 183 mg/dL — ABNORMAL HIGH (ref 70–99)
Potassium: 3.9 mmol/L (ref 3.5–5.1)
Sodium: 135 mmol/L (ref 135–145)

## 2020-01-30 LAB — CBC
HCT: 47.7 % (ref 39.0–52.0)
Hemoglobin: 16.2 g/dL (ref 13.0–17.0)
MCH: 29.1 pg (ref 26.0–34.0)
MCHC: 34 g/dL (ref 30.0–36.0)
MCV: 85.6 fL (ref 80.0–100.0)
Platelets: 364 10*3/uL (ref 150–400)
RBC: 5.57 MIL/uL (ref 4.22–5.81)
RDW: 13.8 % (ref 11.5–15.5)
WBC: 3.6 10*3/uL — ABNORMAL LOW (ref 4.0–10.5)
nRBC: 0 % (ref 0.0–0.2)

## 2020-01-30 LAB — DIGOXIN LEVEL: Digoxin Level: 0.7 ng/mL — ABNORMAL LOW (ref 1.0–2.0)

## 2020-01-30 MED ORDER — DAPAGLIFLOZIN PROPANEDIOL 10 MG PO TABS: 10.0000 mg | ORAL_TABLET | Freq: Every day | ORAL | 3 refills | Status: DC

## 2020-01-30 NOTE — Progress Notes (Signed)
Medication Samples have been provided to the patient.  Drug name: [name]       Strength: 10mg         Qty: 3 bottles (7 tablets each)  LOT and : [id number]  Exp.Date: 11/23 and 1/24  Dosing instructions: 1 tablet Daily  The patient has been instructed regarding the correct time, dose, and frequency of taking this medication, including desired effects and most common side effects.   2/24 12:47 PM 01/30/2020

## 2020-01-30 NOTE — Patient Instructions (Signed)
START Farxiga 10mg  (1 tablet) Daily  Labs done today, your results will be available in MyChart, we will contact you for abnormal readings.  Your physician recommends that you return for:   Labs in 1 week   Pharmacy visit in 6 weeks   Dr. Visit in 4 months  If you have any questions or concerns before your next appointment please send [name] a message through [place] or call our office at [phone].    TO LEAVE A MESSAGE FOR THE NURSE SELECT OPTION 2, PLEASE LEAVE A MESSAGE INCLUDING: . YOUR NAME . DATE OF BIRTH . CALL BACK NUMBER . REASON FOR CALL**this is important as we prioritize the call backs  YOU WILL RECEIVE A CALL BACK THE SAME DAY AS LONG AS YOU CALL BEFORE 4:00 PM

## 2020-01-30 NOTE — Progress Notes (Signed)
Date:  01/30/2020   ID:  [name], DOB 1980-10-16, MRN [id number]    Provider location: [address] [place] Type of Visit: Established patient  PCP:  [name], DO  Cardiologist:  No primary care provider on file. Primary HF: Dr. [name]   History of Present Illness: Kenneth Robertson is a 39 y.o. male  with history of HTN and nonischemic cardiomyopathy who presents for followup of CHF.  He was admitted in 12/17 after 1 month of worsening dyspnea.  He was volume overloaded on exam with EF 10-15% on echo.  He was diuresed and RHC/LHC done.  This showed no CAD. Cardiac output was low.  Cardiac MRI in 4/18 showed severe LV dilation, EF 24%, prominent trabeculation (possible noncompaction), RV insertion LGE (nonspecific).    Echo in 6/20 showed that LV was severely dilated with EF 25-30%.  CPX done in 7/19 surprisingly did not show a significant CHF limitation.  He saw Dr Graciela Husbands and ICD was recommended but he deferred.   Echo repeated 4/21, EF 35-40%, diffuse hypokinesis, normal RV.    He did not tolerate higher dose of Entresto, 49-51 mg dose, due to dizziness. He is back on 24-26 bid dose.   He returns to clinic for routine f/u. Doing well. Denies dyspnea. No exertional symptoms. Denies orthopnea/PND or [place]ner. His wt is up 8 lb since his last visit in April. Reports full med compliance. BP is well controlled. Compliant w/ coumadin. Denies abnormal bleeding.    Labs (7/18): K 4.3, creatinine 1.4 Labs (2/19): K 3.9, creatinine 1.43, digoxin < 0.2 Labs (5/19): K 4, creatinine 1.21, hgb 15.7, digoxin 0.5 Labs (7/19): HIV negative, digoxin 0.3 Labs (8/19): K 4, creatinine 1.3 Labs (9/19): K 4.1, creatinine 1.33 Labs (1/20): HIV negative, K 3.8, creatinine 1.26, digoxin 0.4 Labs (5/20): hgb 15.7, K 4.2, creatinine 1.22, digoxin 0.7 Labs (9/20): K 4.3, creatinine 1.25, HIV negative, digoxin 0.8 Labs (1/21): K 4.1, creatinine 1.27, digoxin 0.6 Labs (4/21): K 4.0,  creatinine 1.15, digoxin 0.6   PMH: 1. HTN 2. Depression 3. Chronic systolic CHF: Nonischemic cardiomyopathy, diagnosed in 12/17.  - Echo (12/17) with EF 10-15%, LV thrombus, moderate MR, PASP 47 mmHg, moderately dilated and dysfunctional RV.  - Echo (7/18) with EF 15%, moderate MR. PASP 40 mm Hg.  - LHC/RHC (12/17): No CAD; mean RA 8, PA 41/22, mean PCWP 20, CI 1.7.  - HIV, SPEP, TSH negative.  - Cardiac MRI in 4/18 showed severe LV dilation, EF 24%, prominent trabeculation (possible noncompaction), RV insertion LGE (nonspecific), no LV thrombus.   - Echo (7/18): EF 15%, severe LV dilation with diffuse HK, grade 3 diastolic dysfunction, mild-moderate MR, PASP 40 mmHg.  - Echo (5/19): EF 25-30%, severe LV dilation with diffuse hypokinesis, mild MR, mild RV dilation with normal systolic function.  - CPX (7/19): RER 1.08, peak VO2 30.2, VE/VCO2 slope 31 => no obvious cardiac limitation.  - Echo (6/20): EF 25-30%, severe LV dilation with diffuse hypokinesis, mild MR, normal RV size and systolic function.  - Echo (4/21): EF 35-40%, diffuse hypokinesis, normal RV size and systolic function.  4. LV thrombus: On warfarin.  5. Sleep study with no OSA.   Past Surgical History:  Procedure Laterality Date  . CARDIAC CATHETERIZATION N/A 04/02/2016   Procedure: Right/Left Heart Cath and Coronary Angiography;  Surgeon: Laurey Morale, MD;  Location: [facility] INVASIVE CV LAB;  Service: Cardiovascular;  Laterality: N/A;  . EYE SURGERY  Left   . TOOTH EXTRACTION       Current Outpatient Medications  Medication Sig Dispense Refill  . carvedilol (COREG) 6.25 MG tablet TAKE 1 TABLET BY MOUTH TWICE A DAY 180 tablet 3  . digoxin (LANOXIN) 0.125 MG tablet TAKE 1 TABLET BY MOUTH EVERY DAY 90 tablet 3  . furosemide (LASIX) 20 MG tablet Take 1 tablet (20 mg total) by mouth daily. 30 tablet 5  . hydrALAZINE (APRESOLINE) 25 MG tablet TAKE 1 TABLET BY MOUTH THREE TIMES A DAY 270 tablet 1  . isosorbide  mononitrate (IMDUR) 30 MG 24 hr tablet Take 1 tablet (30 mg total) by mouth daily. Needs appt for further refills 90 tablet 0  . KLOR-CON M20 20 MEQ tablet TAKE 1 TABLET (20 MEQ TOTAL) DAILY BY MOUTH. 90 tablet 3  . latanoprost (XALATAN) 0.005 % ophthalmic solution Place 1 drop into both eyes at bedtime.    . lidocaine (XYLOCAINE) 2 % solution Use as directed 10 mLs in the mouth or throat as needed for mouth pain. 100 mL 0  . sacubitril-valsartan (ENTRESTO) 24-26 MG Take 1 tablet by mouth 2 (two) times daily. 60 tablet 3  . spironolactone (ALDACTONE) 25 MG tablet TAKE 1 TABLET BY MOUTH EVERY DAY 90 tablet 3  . warfarin (COUMADIN) 5 MG tablet TAKE BY MOUTH AS DIRECTED BY COUMADIN CLINIC 90 tablet 1   No current facility-administered medications for this encounter.    Allergies:   Hydrocodone and Shrimp [shellfish allergy]   Social History:  The patient  reports that he has never smoked. He has never used smokeless tobacco. He reports that he does not drink alcohol and does not use drugs.   Family History:  The patient's family history includes Cancer in his father; Heart failure in his mother.   ROS:  Please see the history of present illness.   All other systems are personally reviewed and negative.   Vitals:   01/30/20 1221  BP: (!) 164/108  Pulse: 88  Weight: 117.1 kg (258 lb 3.2 oz)  SpO2: 99%    PHYSICAL EXAM: General:  Well appearing young male. No respiratory difficulty HEENT: normal Neck: supple. no JVD. Carotids 2+ bilat; no bruits. No lymphadenopathy or thyromegaly appreciated. Cor: PMI nondisplaced. Regular rate & rhythm. No rubs, gallops or murmurs. Lungs: clear Abdomen: soft, nontender, nondistended. No hepatosplenomegaly. No bruits or masses. Good bowel sounds. Extremities: no cyanosis, clubbing, rash, edema Neuro: alert & oriented x 3, cranial nerves grossly intact. moves all 4 extremities w/o difficulty. Affect pleasant.   Recent Labs: 07/17/2019: BUN 12;  Creatinine, Ser 1.15; Potassium 4.0; Sodium 137  Personally reviewed   Wt Readings from Last 3 Encounters:  01/30/20 117.1 kg (258 lb 3.2 oz)  08/03/19 115.4 kg (254 lb 6.4 oz)  07/17/19 113.7 kg (250 lb 9.6 oz)     ASSESSMENT AND PLAN:  1. Chronic systolic CHF: Nonischemic cardiomyopathy.  EF 10-15% by echo in 12/17.  No coronary disease and low output on cath.  HIV negative, SPEP negative.  No family history of early cardiomyopathy (mother with CHF around 22).  No ETOH/drugs. Possible viral myocarditis.  Cardiac MRI in 4/18 with severe LV dilation, EF 24%, RV insertion site LGE (nonspecific), cannot rule out noncompaction.  Echo in 5/19 showed EF 25-30% with severe LV dilation. CPX in 7/19 was quite good with minimal cardiac limitation.  Echo in 6/20 showed severe LV dilation and EF 25-30%, normal RV systolic function.  Most recent echo 4/21 showed  EF improved to 35-40%.   - NYHA Class II. Wt up 8 lb since last visit in April - He did not tolerate 49-51 mg dose of Entresto due to dizziness - Continue Entresto 24-26 mg bid - Add Farxiga 10 mg daily. Check Hgb A1c today  - Continue Spironolactone 25 mg daily  - Continue Coreg 6.25 mg bid.   - Continue digoxin 0.125. Check Dig level  - Continue current hydralazine/Imdur.  - EF now out of range for ICD  - check BMP today and again in 7 days after start of Farxiga  2. LV thrombus: Not seen on more recent studies.    - Continue warfarin for anticoagulation - followed by coumadin clinic   F/u w/ pharm D in 4 weeks to check response to [place]. F/u w/ Dr. [name] in 3-4 months.   Signed, Robbie Lis, PA-C  01/30/2020  [facility] [address] [facility] [place] [address] [phone] (office) [phone] (fax)

## 2020-01-31 LAB — HEMOGLOBIN A1C
Hgb A1c MFr Bld: 6.5 % — ABNORMAL HIGH (ref 4.8–5.6)
Mean Plasma Glucose: 140 mg/dL

## 2020-02-03 ENCOUNTER — Other Ambulatory Visit: Payer: Self-pay | Admitting: Family Medicine

## 2020-02-13 ENCOUNTER — Other Ambulatory Visit: Payer: Self-pay

## 2020-02-13 ENCOUNTER — Ambulatory Visit (HOSPITAL_COMMUNITY)
Admission: RE | Admit: 2020-02-13 | Discharge: 2020-02-13 | Disposition: A | Discharge status: Home / Self Care | Payer: Medicare Other | Source: Ambulatory Visit | Attending: Internal Medicine | Admitting: Internal Medicine

## 2020-02-13 DIAGNOSIS — I5022 Chronic systolic (congestive) heart failure: Secondary | ICD-10-CM | POA: Diagnosis not present

## 2020-02-13 LAB — BASIC METABOLIC PANEL
Anion gap: 8 (ref 5–15)
BUN: 13 mg/dL (ref 6–20)
CO2: 25 mmol/L (ref 22–32)
Calcium: 9.4 mg/dL (ref 8.9–10.3)
Chloride: 103 mmol/L (ref 98–111)
Creatinine, Ser: 1.16 mg/dL (ref 0.61–1.24)
GFR, Estimated: >60 mL/min (ref >60)
Glucose, Bld: 97 mg/dL (ref 70–99)
Potassium: 4.4 mmol/L (ref 3.5–5.1)
Sodium: 136 mmol/L (ref 135–145)

## 2020-03-03 ENCOUNTER — Other Ambulatory Visit (HOSPITAL_COMMUNITY): Payer: Self-pay | Admitting: Cardiology

## 2020-03-12 ENCOUNTER — Inpatient Hospital Stay (HOSPITAL_COMMUNITY)
Admission: RE | Admit: 2020-03-12 | Discharge: 2020-03-12 | Disposition: A | Discharge status: Home / Self Care | Payer: Medicare Other | Source: Ambulatory Visit

## 2020-03-19 ENCOUNTER — Other Ambulatory Visit (HOSPITAL_COMMUNITY): Payer: Self-pay | Admitting: Cardiology

## 2020-04-07 ENCOUNTER — Encounter (HOSPITAL_COMMUNITY): Payer: Medicare Other | Admitting: Cardiology

## 2020-04-15 ENCOUNTER — Other Ambulatory Visit (HOSPITAL_COMMUNITY): Payer: Self-pay | Admitting: Cardiology

## 2020-05-13 ENCOUNTER — Encounter (HOSPITAL_COMMUNITY): Payer: Medicare Other | Admitting: Cardiology

## 2020-05-20 ENCOUNTER — Other Ambulatory Visit (HOSPITAL_COMMUNITY): Payer: Self-pay | Admitting: Cardiology

## 2020-05-24 ENCOUNTER — Other Ambulatory Visit (HOSPITAL_COMMUNITY): Payer: Self-pay | Admitting: Cardiology

## 2020-06-06 ENCOUNTER — Encounter (HOSPITAL_COMMUNITY): Payer: Medicare Other | Admitting: Cardiology

## 2020-06-06 ENCOUNTER — Other Ambulatory Visit (HOSPITAL_COMMUNITY): Payer: Self-pay | Admitting: *Deleted

## 2020-06-06 DIAGNOSIS — I5022 Chronic systolic (congestive) heart failure: Secondary | ICD-10-CM

## 2020-06-09 ENCOUNTER — Telehealth: Payer: Self-pay

## 2020-06-09 NOTE — Telephone Encounter (Signed)
Call placed to patient ref referral to PREP  Explained program to pt Is interested in participating Can do a daytime class at [facility] starting 06/16/20 230p-345p Intake scheduled for 3/10 at 3pm
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
## 2020-06-16 ENCOUNTER — Telehealth: Payer: Self-pay | Admitting: Family Medicine

## 2020-06-16 ENCOUNTER — Other Ambulatory Visit: Payer: Self-pay

## 2020-06-16 ENCOUNTER — Other Ambulatory Visit (HOSPITAL_COMMUNITY)
Admission: RE | Admit: 2020-06-16 | Discharge: 2020-06-16 | Disposition: A | Discharge status: Home / Self Care | Payer: Medicare Other | Source: Ambulatory Visit | Attending: Family Medicine | Admitting: Family Medicine

## 2020-06-16 ENCOUNTER — Ambulatory Visit (INDEPENDENT_AMBULATORY_CARE_PROVIDER_SITE_OTHER): Payer: Medicare Other | Admitting: Family Medicine

## 2020-06-16 ENCOUNTER — Encounter: Payer: Self-pay | Admitting: Family Medicine

## 2020-06-16 VITALS — BP 115/60 | HR 81 | Ht 70.0 in | Wt 270.6 lb

## 2020-06-16 DIAGNOSIS — Z113 Encounter for screening for infections with a predominantly sexual mode of transmission: Secondary | ICD-10-CM | POA: Diagnosis not present

## 2020-06-16 DIAGNOSIS — Z1159 Encounter for screening for other viral diseases: Secondary | ICD-10-CM | POA: Diagnosis not present

## 2020-06-16 DIAGNOSIS — E118 Type 2 diabetes mellitus with unspecified complications: Secondary | ICD-10-CM

## 2020-06-16 DIAGNOSIS — Z114 Encounter for screening for human immunodeficiency virus [HIV]: Secondary | ICD-10-CM | POA: Diagnosis not present

## 2020-06-16 DIAGNOSIS — Z9189 Other specified personal risk factors, not elsewhere classified: Secondary | ICD-10-CM | POA: Diagnosis not present

## 2020-06-16 DIAGNOSIS — A539 Syphilis, unspecified: Secondary | ICD-10-CM | POA: Diagnosis not present

## 2020-06-16 DIAGNOSIS — Z23 Encounter for immunization: Secondary | ICD-10-CM

## 2020-06-16 LAB — POCT GLYCOSYLATED HEMOGLOBIN (HGB A1C): HbA1c, POC (controlled diabetic range): 8.4 % — AB (ref 0.0–7.0)

## 2020-06-16 NOTE — Patient Instructions (Signed)
  It was great seeing you today!  Please check-out at the front desk before leaving the clinic. I'd like to see you back in 6 months but if you need to be seen earlier than that for any new issues we're happy to fit you in, just give [place] a call!  Visit Remembers: - Continue to work on your healthy eating habits and incorporating exercise into your daily life.  - Your goal is to have an A1c < 7  Diet Recommendations for Diabetes  Carbohydrate includes starch, sugar, and fiber.  Of these, only sugar and starch raise blood glucose.  (Fiber is found in fruits, vegetables [especially skin, seeds, and stalks] and whole grains.)   Starchy (carb) foods: Bread, rice, pasta, potatoes, corn, cereal, grits, crackers, bagels, muffins, all baked goods.  (Fruit, milk, and yogurt also have carbohydrate, but most of these foods will not spike your blood sugar as most starchy foods will.)  A few fruits do cause high blood sugars; use small portions of bananas (limit to 1/2 at a time), grapes, watermelon, oranges, and most tropical fruits.   Protein foods: Meat, fish, poultry, eggs, dairy foods, and beans such as pinto and kidney beans (beans also provide carbohydrate).   1. Eat at least REAL 3 meals and 1-2 snacks per day. Never go more than 4-5 hours while awake without eating. Eat breakfast within the first hour of getting up.   2. Limit starchy foods to TWO per meal and ONE per snack. ONE portion of a starchy food is equal to the following:   - ONE slice of bread (or its equivalent, such as half of a hamburger bun).   - 1/2 cup of a "scoopable" starchy food such as potatoes or rice.   - 15 grams of Total Carbohydrate as shown on food label.   - Every 4 ounces of a sweet drink (including fruit juice). 3. Include at every meal: a protein food, a carb food, and vegetables and/or fruit.   - Obtain twice the volume of veg's as protein or carbohydrate foods for both lunch and dinner.   - Fresh or frozen veg's are  best.   - Keep frozen veg's on hand for a quick vegetable serving.       Regarding lab work today:  Due to recent changes in healthcare laws, you may see the results of your imaging and laboratory studies on MyChart before your doctor has had a chance to review them.  We understand that in some cases there may be results that are confusing or concerning to you. Not all laboratory results come back in the same time frame and your doctor may be waiting for multiple results in order to interpret others.  Please give [place] 72 hours in order for your doctor to thoroughly review all the results before contacting the office for clarification of your results. If everything is normal, you will get a letter in the mail or a message in My Chart. Please give [place] a call if you do not hear from [place] after 2 weeks.  Please bring all of your medications with you to each visit.    If you haven't already, sign up for My Chart to have easy access to your labs results, and communication with your primary care physician.  Feel free to call with any questions or concerns at any time, at [phone].   Take care,  Dr. Katherina [facility]

## 2020-06-16 NOTE — Progress Notes (Signed)
    SUBJECTIVE:   Chief compliant/HPI: annual examination  Kenneth Robertson is a 40 y.o. who presents today for an annual exam. He requests STD testing.   History tabs reviewed and updated.   Review of systems form reviewed and notable for hand rash related to hx of syphilis.    OBJECTIVE:   BP 115/60   Pulse 81   Ht 5\' 10"  (1.778 m)   Wt 270 lb 9.6 oz (122.7 kg)   SpO2 97%   BMI 38.83 kg/m    GEN: well appearing male in no acute distress  CV: regular rate and rhythm, no murmurs appreciated RESP: no increased work of breathing, clear to ascultation bilaterally ABD: Bowel sounds present, soft, nontender, nondistended. MSK: no extremity edema, normal ROM SKIN: warm, dry, bilateral scaly hand rash (hx of syphilis)    ASSESSMENT/PLAN:   Routine screening for STI (sexually transmitted infection) Desires STI testing. Hx of syphilis. Treated. RPR remains positive. Will need non-antibody testing. Urine GC/CT obtained. HIV ordered.  - Advised to use barrier protection  - Advise Hep A and B if not already vaccinated  - Pt not a candidate for PREP given his heart conditions     Health Care Maintenance  See AVS for recommendations.  PHQ score 9, reviewed.  Blood pressure value is at goal.  Advanced directives - not discussed  Considered the following screening exams based upon USPSTF recommendations: Diabetes screening: discussed and ordered Screening for elevated cholesterol: discussed and ordered HIV testing: discussed and ordered Hepatitis C: discussed and ordered Hepatitis B: recommended vaccination Hep B and Hep A Syphilis if at high risk: discussed, ordered and will need to repeat as pt has hx of syphilis  Reviewed risk factors for latent tuberculosis and not indicated Colorectal cancer screening: not applicable given age.  if age 53 or over or risk factors.  Immunizations influenza given. Would like to get COVID when clinic stock is replenished. Advise Hep B and Hep A  vaccination.   Follow up in 3 months.    53, DO [facility]

## 2020-06-16 NOTE — Telephone Encounter (Signed)
Patient is returning a missed call from our office. I did not see anything documented on who called. She believes it is concerning the appointment she had today 03/14.  She would like for someone to call her back.

## 2020-06-17 LAB — URINE CYTOLOGY ANCILLARY ONLY
Chlamydia: NEGATIVE
Comment: NEGATIVE
Comment: NEGATIVE
Comment: NORMAL
Neisseria Gonorrhea: NEGATIVE
Trichomonas: NEGATIVE

## 2020-06-17 NOTE — Telephone Encounter (Signed)
Routed to PCP. Deliana Avalos, CMA  

## 2020-06-18 LAB — COMPREHENSIVE METABOLIC PANEL
ALT: 32 IU/L (ref 0–44)
AST: 28 IU/L (ref 0–40)
Albumin/Globulin Ratio: 1.4 (ref 1.2–2.2)
Albumin: 4.4 g/dL (ref 4.0–5.0)
Alkaline Phosphatase: 111 IU/L (ref 44–121)
BUN/Creatinine Ratio: 12 (ref 9–20)
BUN: 15 mg/dL (ref 6–24)
Bilirubin Total: 0.3 mg/dL (ref 0.0–1.2)
CO2: 20 mmol/L (ref 20–29)
Calcium: 9.3 mg/dL (ref 8.7–10.2)
Chloride: 101 mmol/L (ref 96–106)
Creatinine, Ser: 1.21 mg/dL (ref 0.76–1.27)
Globulin, Total: 3.2 g/dL (ref 1.5–4.5)
Glucose: 182 mg/dL — ABNORMAL HIGH (ref 65–99)
Potassium: 4.3 mmol/L (ref 3.5–5.2)
Sodium: 136 mmol/L (ref 134–144)
Total Protein: 7.6 g/dL (ref 6.0–8.5)
eGFR: 78 mL/min/{1.73_m2} (ref >59)

## 2020-06-18 LAB — RPR: RPR Ser Ql: REACTIVE — AB

## 2020-06-18 LAB — LIPID PANEL
Chol/HDL Ratio: 7.1 ratio — ABNORMAL HIGH (ref 0.0–5.0)
Cholesterol, Total: 219 mg/dL — ABNORMAL HIGH (ref 100–199)
HDL: 31 mg/dL — ABNORMAL LOW (ref >39)
LDL Chol Calc (NIH): 158 mg/dL — ABNORMAL HIGH (ref 0–99)
Triglycerides: 165 mg/dL — ABNORMAL HIGH (ref 0–149)
VLDL Cholesterol Cal: 30 mg/dL (ref 5–40)

## 2020-06-18 LAB — RPR, QUANT+TP ABS (REFLEX)
Rapid Plasma Reagin, Quant: 1:4 {titer} — ABNORMAL HIGH
T Pallidum Abs: REACTIVE — AB

## 2020-06-18 LAB — HEPATITIS C ANTIBODY: Hep C Virus Ab: <0.1 s/co ratio (ref 0.0–0.9)

## 2020-06-18 LAB — HIV ANTIBODY (ROUTINE TESTING W REFLEX): HIV Screen 4th Generation wRfx: NONREACTIVE

## 2020-06-18 NOTE — Assessment & Plan Note (Signed)
Desires STI testing. Hx of syphilis. Treated. RPR remains positive. Will need non-antibody testing. Urine GC/CT obtained. HIV ordered.  - Advised to use barrier protection  - Advise Hep A and B if not already vaccinated  - Pt not a candidate for PREP given his heart conditions

## 2020-06-20 ENCOUNTER — Telehealth: Payer: Self-pay | Admitting: Family Medicine

## 2020-06-20 NOTE — Telephone Encounter (Signed)
HiPAA compliant voicemail left for pt to return call or check his MyChart.   [name], DO PGY-2, [facility] 06/20/2020 12:15 PM

## 2020-06-23 ENCOUNTER — Encounter: Payer: Self-pay | Admitting: Family Medicine

## 2020-06-23 NOTE — Progress Notes (Signed)
[facility] [employer] PREP Weekly Session   Patient Details  Name: Kenneth Robertson MRN: [id number] Date of Birth: 12/20/1980 Age: 40 y.o. PCP: [name], DO  Vitals:   06/23/20 1605  Weight: 265 lb (120.2 kg)     [name] Weekly seesion - 06/23/20 1600      Weekly Session   Topic Discussed Other ways to be active;Importance of resistance training    Minutes exercised this week 240 minutes    Classes attended to date 1            Kenneth Robertson 06/23/2020, 4:06 PM

## 2020-06-30 NOTE — Progress Notes (Signed)
[facility] PREP Weekly Session   Patient Details  Name: Kenneth Robertson MRN: [id number] Date of Birth: [birth year] Age: 40 y.o. PCP: [name], DO  Vitals:   06/30/20 1430  Weight: 260 lb (117.9 kg)     [facility] Weekly seesion - 06/30/20 1600      Weekly Session   Topic Discussed Healthy eating tips    Minutes exercised this week 180 minutes    Classes attended to date 3            Kenneth Robertson B [name] 06/30/2020, 4:06 PM

## 2020-07-07 NOTE — Progress Notes (Signed)
[facility] PREP Weekly Session   Patient Details  Name: Kenneth Robertson MRN: [id number] Date of Birth: 10-17-1980 Age: 40 y.o. PCP: [name], DO  Vitals:   07/07/20 1430  Weight: 257 lb (116.6 kg)     [name] Weekly seesion - 07/07/20 1500      Weekly Session   Topic Discussed Health habits   Sugar Demo   Minutes exercised this week 240 minutes    Classes attended to date 5            Shantoya Geurts B Keiasia Christianson 07/07/2020, 3:57 PM

## 2020-07-21 ENCOUNTER — Encounter (HOSPITAL_COMMUNITY): Payer: Self-pay | Admitting: Cardiology

## 2020-07-21 ENCOUNTER — Other Ambulatory Visit: Payer: Self-pay

## 2020-07-21 ENCOUNTER — Ambulatory Visit (HOSPITAL_COMMUNITY)
Admission: RE | Admit: 2020-07-21 | Discharge: 2020-07-21 | Disposition: A | Discharge status: Home / Self Care | Payer: Medicare Other | Source: Ambulatory Visit | Attending: Cardiology | Admitting: Cardiology

## 2020-07-21 VITALS — BP 130/84 | HR 73 | Wt 265.2 lb

## 2020-07-21 DIAGNOSIS — E7849 Other hyperlipidemia: Secondary | ICD-10-CM | POA: Diagnosis not present

## 2020-07-21 DIAGNOSIS — I5022 Chronic systolic (congestive) heart failure: Secondary | ICD-10-CM | POA: Insufficient documentation

## 2020-07-21 DIAGNOSIS — Z79899 Other long term (current) drug therapy: Secondary | ICD-10-CM | POA: Insufficient documentation

## 2020-07-21 DIAGNOSIS — Z7901 Long term (current) use of anticoagulants: Secondary | ICD-10-CM | POA: Diagnosis not present

## 2020-07-21 DIAGNOSIS — E785 Hyperlipidemia, unspecified: Secondary | ICD-10-CM | POA: Insufficient documentation

## 2020-07-21 DIAGNOSIS — I428 Other cardiomyopathies: Secondary | ICD-10-CM | POA: Insufficient documentation

## 2020-07-21 DIAGNOSIS — Z8249 Family history of ischemic heart disease and other diseases of the circulatory system: Secondary | ICD-10-CM | POA: Diagnosis not present

## 2020-07-21 DIAGNOSIS — I110 Hypertensive heart disease with heart failure: Secondary | ICD-10-CM | POA: Diagnosis not present

## 2020-07-21 DIAGNOSIS — Z7984 Long term (current) use of oral hypoglycemic drugs: Secondary | ICD-10-CM | POA: Diagnosis not present

## 2020-07-21 DIAGNOSIS — E119 Type 2 diabetes mellitus without complications: Secondary | ICD-10-CM | POA: Insufficient documentation

## 2020-07-21 LAB — BASIC METABOLIC PANEL
Anion gap: 6 (ref 5–15)
BUN: 11 mg/dL (ref 6–20)
CO2: 26 mmol/L (ref 22–32)
Calcium: 9 mg/dL (ref 8.9–10.3)
Chloride: 103 mmol/L (ref 98–111)
Creatinine, Ser: 1.16 mg/dL (ref 0.61–1.24)
GFR, Estimated: >60 mL/min (ref >60)
Glucose, Bld: 117 mg/dL — ABNORMAL HIGH (ref 70–99)
Potassium: 3.8 mmol/L (ref 3.5–5.1)
Sodium: 135 mmol/L (ref 135–145)

## 2020-07-21 LAB — DIGOXIN LEVEL: Digoxin Level: <0.2 ng/mL — ABNORMAL LOW (ref 0.8–2.0)

## 2020-07-21 MED ORDER — ROSUVASTATIN CALCIUM 10 MG PO TABS: 10.0000 mg | ORAL_TABLET | Freq: Every day | ORAL | 11 refills | Status: DC

## 2020-07-21 MED ORDER — CARVEDILOL 12.5 MG PO TABS: 12.5000 mg | ORAL_TABLET | Freq: Two times a day (BID) | ORAL | 3 refills | Status: DC

## 2020-07-21 NOTE — Progress Notes (Signed)
[facility] PREP Weekly Session   Patient Details  Name: Kenneth Robertson MRN: [id number] Date of Birth: Apr 01, 1981 Age: 40 y.o. PCP: [name], DO  Vitals:   07/21/20 1430  Weight: 260 lb (117.9 kg)     [name] Weekly seesion - 07/21/20 1600      Weekly Session   Topic Discussed Stress management and problem solving   importance of sleep   Minutes exercised this week 480 minutes    Classes attended to date 8            [name] 07/21/2020, 4:06 PM

## 2020-07-21 NOTE — Patient Instructions (Signed)
Labs done today. We will contact you only if your labs are abnormal.  INCREASE Carvedilol (Coreg) to 12.5mg  (1 tablet) by mouth 2 times daily.  START Crestor 10mg  (1 tablet) by mouth daily.  No other medication changes were made. Please continue all current medications as prescribed.  Your physician recommends that you schedule a follow-up appointment in: 2 months for a lab only appointment and in 3 months for an appointment with Dr. with an echo prior to your exam.  Your physician has requested that you have an echocardiogram. Echocardiography is a painless test that uses sound waves to create images of your heart. It provides your doctor with information about the size and shape of your heart and how well your heart's chambers and valves are working. This procedure takes approximately one hour. There are no restrictions for this procedure.   If you have any questions or concerns before your next appointment please send [name] a message through [place] or call our office at [phone].    TO LEAVE A MESSAGE FOR THE NURSE SELECT OPTION 2, PLEASE LEAVE A MESSAGE INCLUDING: . YOUR NAME . DATE OF BIRTH . CALL BACK NUMBER . REASON FOR CALL**this is important as we prioritize the call backs  YOU WILL RECEIVE A CALL BACK THE SAME DAY AS LONG AS YOU CALL BEFORE 4:00 PM   Do the following things EVERYDAY: 1) Weigh yourself in the morning before breakfast. Write it down and keep it in a log. 2) Take your medicines as prescribed 3) Eat low salt foods--Limit salt (sodium) to 2000 mg per day.  4) Stay as active as you can everyday 5) Limit all fluids for the day to less than 2 liters   At the [facility], you and your health needs are our priority. As part of our continuing mission to provide you with exceptional heart care, we have created designated Provider Care Teams. These Care Teams include your primary Cardiologist (physician) and Advanced Practice Providers (APPs-  Physician Assistants and Nurse Practitioners) who all work together to provide you with the care you need, when you need it.   You may see any of the following providers on your designated Care Team at your next follow up: [phone] Dr Marland Kitchen . Dr Arvilla Meres . Marca Ancona, NP . Tonye Becket, PA . Robbie Lis, PharmD   Please be sure to bring in all your medications bottles to every appointment.

## 2020-07-21 NOTE — Progress Notes (Signed)
Date:  07/21/2020   ID:  Kenneth Robertson, DOB 11/24/1980, MRN [id number]    Provider location: [address] [place] Type of Visit: Established patient  PCP:  [name], DO  Cardiologist:  No primary care provider on file. Primary HF: Dr. [name]   History of Present Illness: Kenneth Robertson is a 40 y.o. male  with history of HTN and nonischemic cardiomyopathy who presents for followup of CHF.  He was admitted in 12/17 after 1 month of worsening dyspnea.  He was volume overloaded on exam with EF 10-15% on echo.  He was diuresed and RHC/LHC done.  This showed no CAD. Cardiac output was low.  Cardiac MRI in 4/18 showed severe LV dilation, EF 24%, prominent trabeculation (possible noncompaction), RV insertion LGE (nonspecific).    Echo in 6/20 showed that LV was severely dilated with EF 25-30%.  CPX done in 7/19 surprisingly did not show a significant CHF limitation.  He saw Dr Graciela Husbands and ICD was recommended but he deferred.   Echo in 4/21 showed EF 35-40%, diffuse hypokinesis, normal RV.    Weight is up 7 lbs today.  He is doing well symptomatically, reports no significant exertional dyspnea, can climb stairs with no problems.  Recently diagnosed with diabetes and found to have very elevated LDL cholesterol.  No orthopnea/PND.  No chest pain, no lightheadedness, no palpitations.   Labs (7/18): K 4.3, creatinine 1.4 Labs (2/19): K 3.9, creatinine 1.43, digoxin < 0.2 Labs (5/19): K 4, creatinine 1.21, hgb 15.7, digoxin 0.5 Labs (7/19): HIV negative, digoxin 0.3 Labs (8/19): K 4, creatinine 1.3 Labs (9/19): K 4.1, creatinine 1.33 Labs (1/20): HIV negative, K 3.8, creatinine 1.26, digoxin 0.4 Labs (5/20): hgb 15.7, K 4.2, creatinine 1.22, digoxin 0.7 Labs (9/20): K 4.3, creatinine 1.25, HIV negative, digoxin 0.8 Labs (1/21): K 4.1, creatinine 1.27, digoxin 0.6 Labs (3/22): LDL 158, K 4.3, creatinine 1.2  PMH: 1. HTN 2. Depression 3. Chronic systolic CHF: Nonischemic  cardiomyopathy, diagnosed in 12/17.  - Echo (12/17) with EF 10-15%, LV thrombus, moderate MR, PASP 47 mmHg, moderately dilated and dysfunctional RV.  - Echo (7/18) with EF 15%, moderate MR. PASP 40 mm Hg.  - LHC/RHC (12/17): No CAD; mean RA 8, PA 41/22, mean PCWP 20, CI 1.7.  - HIV, SPEP, TSH negative.  - Cardiac MRI in 4/18 showed severe LV dilation, EF 24%, prominent trabeculation (possible noncompaction), RV insertion LGE (nonspecific), no LV thrombus.   - Echo (7/18): EF 15%, severe LV dilation with diffuse HK, grade 3 diastolic dysfunction, mild-moderate MR, PASP 40 mmHg.  - Echo (5/19): EF 25-30%, severe LV dilation with diffuse hypokinesis, mild MR, mild RV dilation with normal systolic function.  - CPX (7/19): RER 1.08, peak VO2 30.2, VE/VCO2 slope 31 => no obvious cardiac limitation.  - Echo (6/20): EF 25-30%, severe LV dilation with diffuse hypokinesis, mild MR, normal RV size and systolic function.  - Echo (4/21): EF 35-40%, diffuse hypokinesis, normal RV size and systolic function.  4. LV thrombus: On warfarin.  5. Sleep study with no OSA.  6. Type II diabetes 7. Hyperlipidemia  Past Surgical History:  Procedure Laterality Date  . CARDIAC CATHETERIZATION N/A 04/02/2016   Procedure: Right/Left Heart Cath and Coronary Angiography;  Surgeon: Laurey Morale, MD;  Location: [facility] INVASIVE CV LAB;  Service: Cardiovascular;  Laterality: N/A;  . EYE SURGERY     Left   . TOOTH EXTRACTION  Current Outpatient Medications  Medication Sig Dispense Refill  . dapagliflozin propanediol (FARXIGA) 10 MG TABS tablet Take 1 tablet (10 mg total) by mouth daily before breakfast. 30 tablet 3  . digoxin (LANOXIN) 0.125 MG tablet TAKE 1 TABLET BY MOUTH EVERY DAY 90 tablet 3  . ENTRESTO 24-26 MG TAKE 1 TABLET BY MOUTH 2 (TWO) TIMES DAILY. 60 tablet 3  . furosemide (LASIX) 20 MG tablet TAKE 1 TABLET BY MOUTH EVERY DAY 90 tablet 1  . hydrALAZINE (APRESOLINE) 25 MG tablet Take 1 tablet (25 mg  total) by mouth 3 (three) times daily. Needs appointment for further refill. 270 tablet 0  . isosorbide mononitrate (IMDUR) 30 MG 24 hr tablet TAKE 1 TABLET (30 MG TOTAL) BY MOUTH DAILY. NEEDS APPT FOR FURTHER REFILLS 61 tablet 0  . KLOR-CON M20 20 MEQ tablet TAKE 1 TABLET (20 MEQ TOTAL) DAILY BY MOUTH. 90 tablet 3  . latanoprost (XALATAN) 0.005 % ophthalmic solution Place 1 drop into both eyes at bedtime.    . rosuvastatin (CRESTOR) 10 MG tablet Take 1 tablet (10 mg total) by mouth daily. 30 tablet 11  . spironolactone (ALDACTONE) 25 MG tablet TAKE 1 TABLET BY MOUTH EVERY DAY 90 tablet 3  . warfarin (COUMADIN) 5 MG tablet TAKE BY MOUTH AS DIRECTED BY COUMADIN CLINIC 90 tablet 1  . carvedilol (COREG) 12.5 MG tablet Take 1 tablet (12.5 mg total) by mouth 2 (two) times daily. 180 tablet 3   No current facility-administered medications for this encounter.    Allergies:   Hydrocodone and Shrimp [shellfish allergy]   Social History:  The patient  reports that he has never smoked. He has never used smokeless tobacco. He reports that he does not drink alcohol and does not use drugs.   Family History:  The patient's family history includes Cancer in his father; Heart failure in his mother.   ROS:  Please see the history of present illness.   All other systems are personally reviewed and negative.   Exam:   BP 130/84   Pulse 73   Wt 120.3 kg (265 lb 3.2 oz)   SpO2 97%   BMI 38.05 kg/m  General: NAD Neck: No JVD, no thyromegaly or thyroid nodule.  Lungs: Clear to auscultation bilaterally with normal respiratory effort. CV: Nondisplaced PMI.  Heart regular S1/S2, no S3/S4, no murmur.  No peripheral edema.  No carotid bruit.  Normal pedal pulses.  Abdomen: Soft, nontender, no hepatosplenomegaly, no distention.  Skin: Intact without lesions or rashes.  Neurologic: Alert and oriented x 3.  Psych: Normal affect. Extremities: No clubbing or cyanosis.  HEENT: Normal.   Recent Labs: 01/30/2020:  Hemoglobin 16.2; Platelets 364 06/16/2020: ALT 32 07/21/2020: BUN 11; Creatinine, Ser 1.16; Potassium 3.8; Sodium 135  Personally reviewed   Wt Readings from Last 3 Encounters:  07/21/20 117.9 kg (260 lb)  07/21/20 120.3 kg (265 lb 3.2 oz)  07/07/20 116.6 kg (257 lb)     ASSESSMENT AND PLAN:  1. Chronic systolic CHF: Nonischemic cardiomyopathy.  EF 10-15% by echo in 12/17.  No coronary disease and low output on cath.  HIV negative, SPEP negative.  No family history of early cardiomyopathy (mother with CHF around 31).  No ETOH/drugs. Possible viral myocarditis.  Cardiac MRI in 4/18 with severe LV dilation, EF 24%, RV insertion site LGE (nonspecific), cannot rule out noncompaction.  Echo in 5/19 showed EF 25-30% with severe LV dilation. CPX in 7/19 was quite good with minimal cardiac limitation.  Echo  in 6/20 showed severe LV dilation and EF 25-30%, normal RV systolic function.  Echo in 4/21 showed EF improved to 35-40%.  He seems to be doing better about taking his medications regularly, which may be helping.  NYHA class II symptoms.  He is not volume overloaded on exam.   - He did not tolerate increase in Entresto to 49/51 bid, continue 24/26 bid.   - Increase Coreg to 12.5 mg bid.   - Continue digoxin 0.125 and check level.   - Continue spironolactone 25 mg daily. BMET today.  - Continue current hydralazine/Imdur.  - Continue Lasix 20 mg daily.  - I think that EF is currently outside of ICD range.  He is not a CRT candidate with relatively narrow QRS. Hopefully, if he continues to be compliant with meds, LV function will continue to improve.  - Repeat echo at followup in 3 months.  2. LV thrombus: Not seen on more recent studies.    - Continue warfarin for anticoagulation  3. Hyperlipidemia: Very high LDL.  - Start Crestor 10 mg daily with lipids/LFTs in 2 months.   Followup with echo in 3 months.   Signed, Marca Ancona, MD  07/21/2020  [facility] [address] [facility] [place] [address] [phone] (office) [phone] (fax)

## 2020-08-18 NOTE — Progress Notes (Signed)
[facility] [employer] PREP Weekly Session   Patient Details  Name: Kenneth Robertson MRN: [id number] Date of Birth: [birth year] Age: 40 y.o. PCP: [name], DO  Vitals:   08/18/20 1558  Weight: 250 lb (113.4 kg)     [name] Weekly seesion - 08/18/20 1500      Weekly Session   Topic Discussed Calorie breakdown   Membership talk with Weston Brass   Minutes exercised this week 720 minutes    Classes attended to date 10            Yobani Schertzer B Avonna Iribe 08/18/2020, 3:59 PM

## 2020-08-25 ENCOUNTER — Other Ambulatory Visit (HOSPITAL_COMMUNITY): Payer: Self-pay | Admitting: Cardiology

## 2020-08-27 ENCOUNTER — Other Ambulatory Visit: Payer: Self-pay | Admitting: Family Medicine

## 2020-09-03 ENCOUNTER — Other Ambulatory Visit (HOSPITAL_COMMUNITY): Payer: Self-pay | Admitting: *Deleted

## 2020-09-08 ENCOUNTER — Other Ambulatory Visit (HOSPITAL_COMMUNITY): Payer: Self-pay | Admitting: Cardiology

## 2020-09-09 ENCOUNTER — Ambulatory Visit: Payer: Medicare Other

## 2020-09-11 ENCOUNTER — Ambulatory Visit: Payer: Medicare Other

## 2020-09-12 ENCOUNTER — Other Ambulatory Visit (HOSPITAL_COMMUNITY): Payer: Self-pay | Admitting: Cardiology

## 2020-09-23 ENCOUNTER — Other Ambulatory Visit (HOSPITAL_COMMUNITY): Payer: Medicare Other

## 2020-09-30 ENCOUNTER — Other Ambulatory Visit (HOSPITAL_COMMUNITY): Payer: Medicare Other

## 2020-10-03 ENCOUNTER — Ambulatory Visit (INDEPENDENT_AMBULATORY_CARE_PROVIDER_SITE_OTHER): Payer: Medicare Other | Admitting: Family Medicine

## 2020-10-03 ENCOUNTER — Encounter: Payer: Self-pay | Admitting: Family Medicine

## 2020-10-03 ENCOUNTER — Other Ambulatory Visit: Payer: Self-pay

## 2020-10-03 ENCOUNTER — Other Ambulatory Visit (HOSPITAL_COMMUNITY)
Admission: RE | Admit: 2020-10-03 | Discharge: 2020-10-03 | Disposition: A | Discharge status: Home / Self Care | Payer: Medicare Other | Source: Ambulatory Visit | Attending: Family Medicine | Admitting: Family Medicine

## 2020-10-03 VITALS — BP 122/102 | HR 79 | Ht 70.0 in | Wt 256.2 lb

## 2020-10-03 DIAGNOSIS — E1165 Type 2 diabetes mellitus with hyperglycemia: Secondary | ICD-10-CM | POA: Diagnosis not present

## 2020-10-03 DIAGNOSIS — R070 Pain in throat: Secondary | ICD-10-CM | POA: Insufficient documentation

## 2020-10-03 DIAGNOSIS — E785 Hyperlipidemia, unspecified: Secondary | ICD-10-CM

## 2020-10-03 DIAGNOSIS — Z7901 Long term (current) use of anticoagulants: Secondary | ICD-10-CM | POA: Diagnosis not present

## 2020-10-03 LAB — POCT RAPID STREP A (OFFICE): Rapid Strep A Screen: NEGATIVE

## 2020-10-03 MED ORDER — DAPAGLIFLOZIN PROPANEDIOL 10 MG PO TABS: 10.0000 mg | ORAL_TABLET | Freq: Every day | ORAL | 3 refills | Status: DC

## 2020-10-03 MED ORDER — ROSUVASTATIN CALCIUM 20 MG PO TABS: 20.0000 mg | ORAL_TABLET | Freq: Every day | ORAL | 3 refills | Status: DC

## 2020-10-03 NOTE — Patient Instructions (Addendum)
It was great seeing you today!  Please check-out at the front desk before leaving the clinic. I'd like to see you back in 3 months but if you need to be seen earlier than that for any new issues we're happy to fit you in, just give [place] a call!  Visit Remembers: - Stop by the pharmacy to pick up your prescriptions  - Continue to work on your healthy eating habits and incorporating exercise into your daily life.  - Your goal is to have an BP < 120/80 - Medicine Changes: Restart Farxiga for your blood pressure and heart failure.  - To Do: Talk with your cardiologist about starting Eliquis instead of Warfarin   -- I will send you a Mychart message about you throat swab results and send in treatment to your pharmacy (if needed).    Please bring all of your medications with you to each visit.    Feel free to call with any questions or concerns at any time, at [phone].   Take care,  Dr. Katherina [facility]
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
## 2020-10-07 DIAGNOSIS — IMO0002 Reserved for concepts with insufficient information to code with codable children: Secondary | ICD-10-CM | POA: Insufficient documentation

## 2020-10-07 DIAGNOSIS — E785 Hyperlipidemia, unspecified: Secondary | ICD-10-CM | POA: Insufficient documentation

## 2020-10-07 DIAGNOSIS — R070 Pain in throat: Secondary | ICD-10-CM | POA: Insufficient documentation

## 2020-10-07 LAB — CERVICOVAGINAL ANCILLARY ONLY
Candida Glabrata: NEGATIVE
Candida Vaginitis: NEGATIVE
Chlamydia: NEGATIVE
Comment: NEGATIVE
Comment: NEGATIVE
Comment: NEGATIVE
Comment: NEGATIVE
Comment: NORMAL
Neisseria Gonorrhea: NEGATIVE
Trichomonas: NEGATIVE

## 2020-10-07 NOTE — Assessment & Plan Note (Signed)
-   Increase Crestor to 20 mg daily

## 2020-10-07 NOTE — Assessment & Plan Note (Signed)
Rapid strept negative. GC/CT collected. Consider COVID tested if sx do not resolve as pt is unvaccinated.

## 2020-10-07 NOTE — Assessment & Plan Note (Signed)
Discussed changing to DOAC as he is not getting INR checked regularly. Last INR >1year ago. Last echo did not show LV thrombus.  Pt would like cardiology approval. He has upcoming appt and is to discuss with his cardiology. Unfortunately, he declined blood work in clinic today after a mishap with lab staff.

## 2020-10-07 NOTE — Assessment & Plan Note (Signed)
Restart Farxiga 10mg .  A1c was not obtained today and previously 8.4. Encouraged continued diet rich in vegetables and complex carbs.  Heart healthy carb modified diet. Counseled on need to continue exercising.  Statin therapy: Crestor ACEi/ARB: 

## 2020-10-08 ENCOUNTER — Other Ambulatory Visit (HOSPITAL_COMMUNITY): Payer: Medicare Other

## 2020-10-10 ENCOUNTER — Other Ambulatory Visit (HOSPITAL_COMMUNITY): Payer: Self-pay | Admitting: Cardiology

## 2020-10-14 ENCOUNTER — Other Ambulatory Visit (HOSPITAL_COMMUNITY): Payer: Self-pay | Admitting: Cardiology

## 2020-10-14 ENCOUNTER — Other Ambulatory Visit (HOSPITAL_COMMUNITY): Payer: Medicare Other

## 2020-10-22 ENCOUNTER — Encounter (HOSPITAL_COMMUNITY): Payer: Medicare Other | Admitting: Cardiology

## 2020-10-22 ENCOUNTER — Ambulatory Visit (HOSPITAL_COMMUNITY): Payer: Medicare Other

## 2020-10-30 ENCOUNTER — Other Ambulatory Visit (HOSPITAL_COMMUNITY): Payer: Self-pay | Admitting: *Deleted

## 2020-10-30 MED ORDER — SPIRONOLACTONE 25 MG PO TABS: 25.0000 mg | ORAL_TABLET | Freq: Every day | ORAL | 3 refills | Status: DC

## 2020-11-01 ENCOUNTER — Other Ambulatory Visit (HOSPITAL_COMMUNITY): Payer: Self-pay | Admitting: Cardiology

## 2020-12-06 ENCOUNTER — Other Ambulatory Visit (HOSPITAL_COMMUNITY): Payer: Self-pay | Admitting: Cardiology

## 2020-12-10 ENCOUNTER — Other Ambulatory Visit (HOSPITAL_COMMUNITY): Payer: Self-pay | Admitting: Cardiology

## 2020-12-24 DIAGNOSIS — H52223 Regular astigmatism, bilateral: Secondary | ICD-10-CM | POA: Diagnosis not present

## 2020-12-24 DIAGNOSIS — H401134 Primary open-angle glaucoma, bilateral, indeterminate stage: Secondary | ICD-10-CM | POA: Diagnosis not present

## 2020-12-24 DIAGNOSIS — H5213 Myopia, bilateral: Secondary | ICD-10-CM | POA: Diagnosis not present

## 2020-12-24 DIAGNOSIS — E119 Type 2 diabetes mellitus without complications: Secondary | ICD-10-CM | POA: Diagnosis not present

## 2020-12-26 ENCOUNTER — Encounter: Payer: Self-pay | Admitting: Family Medicine

## 2020-12-26 ENCOUNTER — Other Ambulatory Visit (HOSPITAL_COMMUNITY): Payer: Self-pay | Admitting: *Deleted

## 2020-12-26 NOTE — Telephone Encounter (Signed)
Patient calls nurse line in regards to [facility] message and being out of coumadin. Patient reports he took his last dose on Monday. Patient reports he doesn't "feel well," and has been experiencing a rapid pulse. Patient denies any SOB. Patient advised to go to urgent care. Please advise on medication.

## 2020-12-26 NOTE — Telephone Encounter (Signed)
Patient calls again in regards to refill. Reminded patient of medication refill policy.

## 2020-12-28 ENCOUNTER — Other Ambulatory Visit: Payer: Self-pay | Admitting: Family Medicine

## 2020-12-28 MED ORDER — WARFARIN SODIUM 5 MG PO TABS: ORAL_TABLET | ORAL | 0 refills | Status: DC

## 2020-12-28 NOTE — Progress Notes (Signed)
Pt needs appointment to discuss anti-coagulation. He is taking Warfarin but is not getting regular INR checks.  Do not want him to be without this medication however he needs a follow up appointment. He left prior to getting INR at his last visit. A 14 day supply given with instructions to call for an appointment.   [name], DO PGY-3, [facility] 12/28/2020

## 2021-01-02 ENCOUNTER — Emergency Department (HOSPITAL_COMMUNITY): Payer: Medicare Other

## 2021-01-02 ENCOUNTER — Emergency Department (HOSPITAL_COMMUNITY)
Admission: EM | Admit: 2021-01-02 | Discharge: 2021-01-02 | Disposition: A | Discharge status: Home / Self Care | Payer: Medicare Other | Attending: Emergency Medicine | Admitting: Emergency Medicine

## 2021-01-02 ENCOUNTER — Encounter (HOSPITAL_COMMUNITY): Payer: Self-pay

## 2021-01-02 ENCOUNTER — Other Ambulatory Visit: Payer: Self-pay

## 2021-01-02 DIAGNOSIS — Z79899 Other long term (current) drug therapy: Secondary | ICD-10-CM | POA: Diagnosis not present

## 2021-01-02 DIAGNOSIS — J0120 Acute ethmoidal sinusitis, unspecified: Secondary | ICD-10-CM | POA: Insufficient documentation

## 2021-01-02 DIAGNOSIS — I110 Hypertensive heart disease with heart failure: Secondary | ICD-10-CM | POA: Diagnosis not present

## 2021-01-02 DIAGNOSIS — E119 Type 2 diabetes mellitus without complications: Secondary | ICD-10-CM | POA: Diagnosis not present

## 2021-01-02 DIAGNOSIS — I5023 Acute on chronic systolic (congestive) heart failure: Secondary | ICD-10-CM | POA: Insufficient documentation

## 2021-01-02 DIAGNOSIS — R519 Headache, unspecified: Secondary | ICD-10-CM | POA: Diagnosis not present

## 2021-01-02 LAB — CBC WITH DIFFERENTIAL/PLATELET
Abs Immature Granulocytes: 0.01 10*3/uL (ref 0.00–0.07)
Basophils Absolute: 0 10*3/uL (ref 0.0–0.1)
Basophils Relative: 1 %
Eosinophils Absolute: 0.1 10*3/uL (ref 0.0–0.5)
Eosinophils Relative: 2 %
HCT: 50.8 % (ref 39.0–52.0)
Hemoglobin: 17.2 g/dL — ABNORMAL HIGH (ref 13.0–17.0)
Immature Granulocytes: 0 %
Lymphocytes Relative: 31 %
Lymphs Abs: 1.4 10*3/uL (ref 0.7–4.0)
MCH: 28.8 pg (ref 26.0–34.0)
MCHC: 33.9 g/dL (ref 30.0–36.0)
MCV: 84.9 fL (ref 80.0–100.0)
Monocytes Absolute: 0.4 10*3/uL (ref 0.1–1.0)
Monocytes Relative: 8 %
Neutro Abs: 2.5 10*3/uL (ref 1.7–7.7)
Neutrophils Relative %: 58 %
Platelets: 318 10*3/uL (ref 150–400)
RBC: 5.98 MIL/uL — ABNORMAL HIGH (ref 4.22–5.81)
RDW: 13.4 % (ref 11.5–15.5)
WBC: 4.4 10*3/uL (ref 4.0–10.5)
nRBC: 0 % (ref 0.0–0.2)

## 2021-01-02 LAB — BASIC METABOLIC PANEL
Anion gap: 6 (ref 5–15)
BUN: 15 mg/dL (ref 6–20)
CO2: 29 mmol/L (ref 22–32)
Calcium: 9.4 mg/dL (ref 8.9–10.3)
Chloride: 103 mmol/L (ref 98–111)
Creatinine, Ser: 1.21 mg/dL (ref 0.61–1.24)
GFR, Estimated: >60 mL/min (ref >60)
Glucose, Bld: 81 mg/dL (ref 70–99)
Potassium: 4 mmol/L (ref 3.5–5.1)
Sodium: 138 mmol/L (ref 135–145)

## 2021-01-02 LAB — PROTIME-INR
INR: 1.1 (ref 0.8–1.2)
Prothrombin Time: 13.8 seconds (ref 11.4–15.2)

## 2021-01-02 MED ORDER — AMOXICILLIN-POT CLAVULANATE 875-125 MG PO TABS: 1.0000 | ORAL_TABLET | Freq: Two times a day (BID) | ORAL | 0 refills | Status: DC

## 2021-01-02 MED ORDER — ACETAMINOPHEN 500 MG PO TABS: 500.0000 mg | ORAL_TABLET | Freq: Four times a day (QID) | ORAL | 0 refills | Status: AC | PRN

## 2021-01-02 MED ORDER — ACETAMINOPHEN 500 MG PO TABS
1000.0000 mg | ORAL_TABLET | Freq: Once | ORAL | Status: AC
  Administered 2021-01-02: 1000 mg via ORAL
  Filled 2021-01-02: qty 2

## 2021-01-02 NOTE — ED Triage Notes (Signed)
Pt arrived via POV, c/o headache x3 days. Endorses photosensitivity. Denies any other sx.
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
## 2021-01-12 ENCOUNTER — Encounter (HOSPITAL_COMMUNITY): Payer: Medicare Other | Admitting: Cardiology

## 2021-01-12 ENCOUNTER — Ambulatory Visit (HOSPITAL_COMMUNITY): Admission: RE | Admit: 2021-01-12 | Payer: Medicare Other | Source: Ambulatory Visit

## 2021-01-15 IMAGING — DX CHEST - 2 VIEW
2 series · 2 of 2 positions shown · non-contrast
Comparison: Chest x-ray dated May 18, 2017.

CLINICAL DATA: Chest pain and shortness of breath.

EXAM:
CHEST - 2 VIEW

[w chest pa]
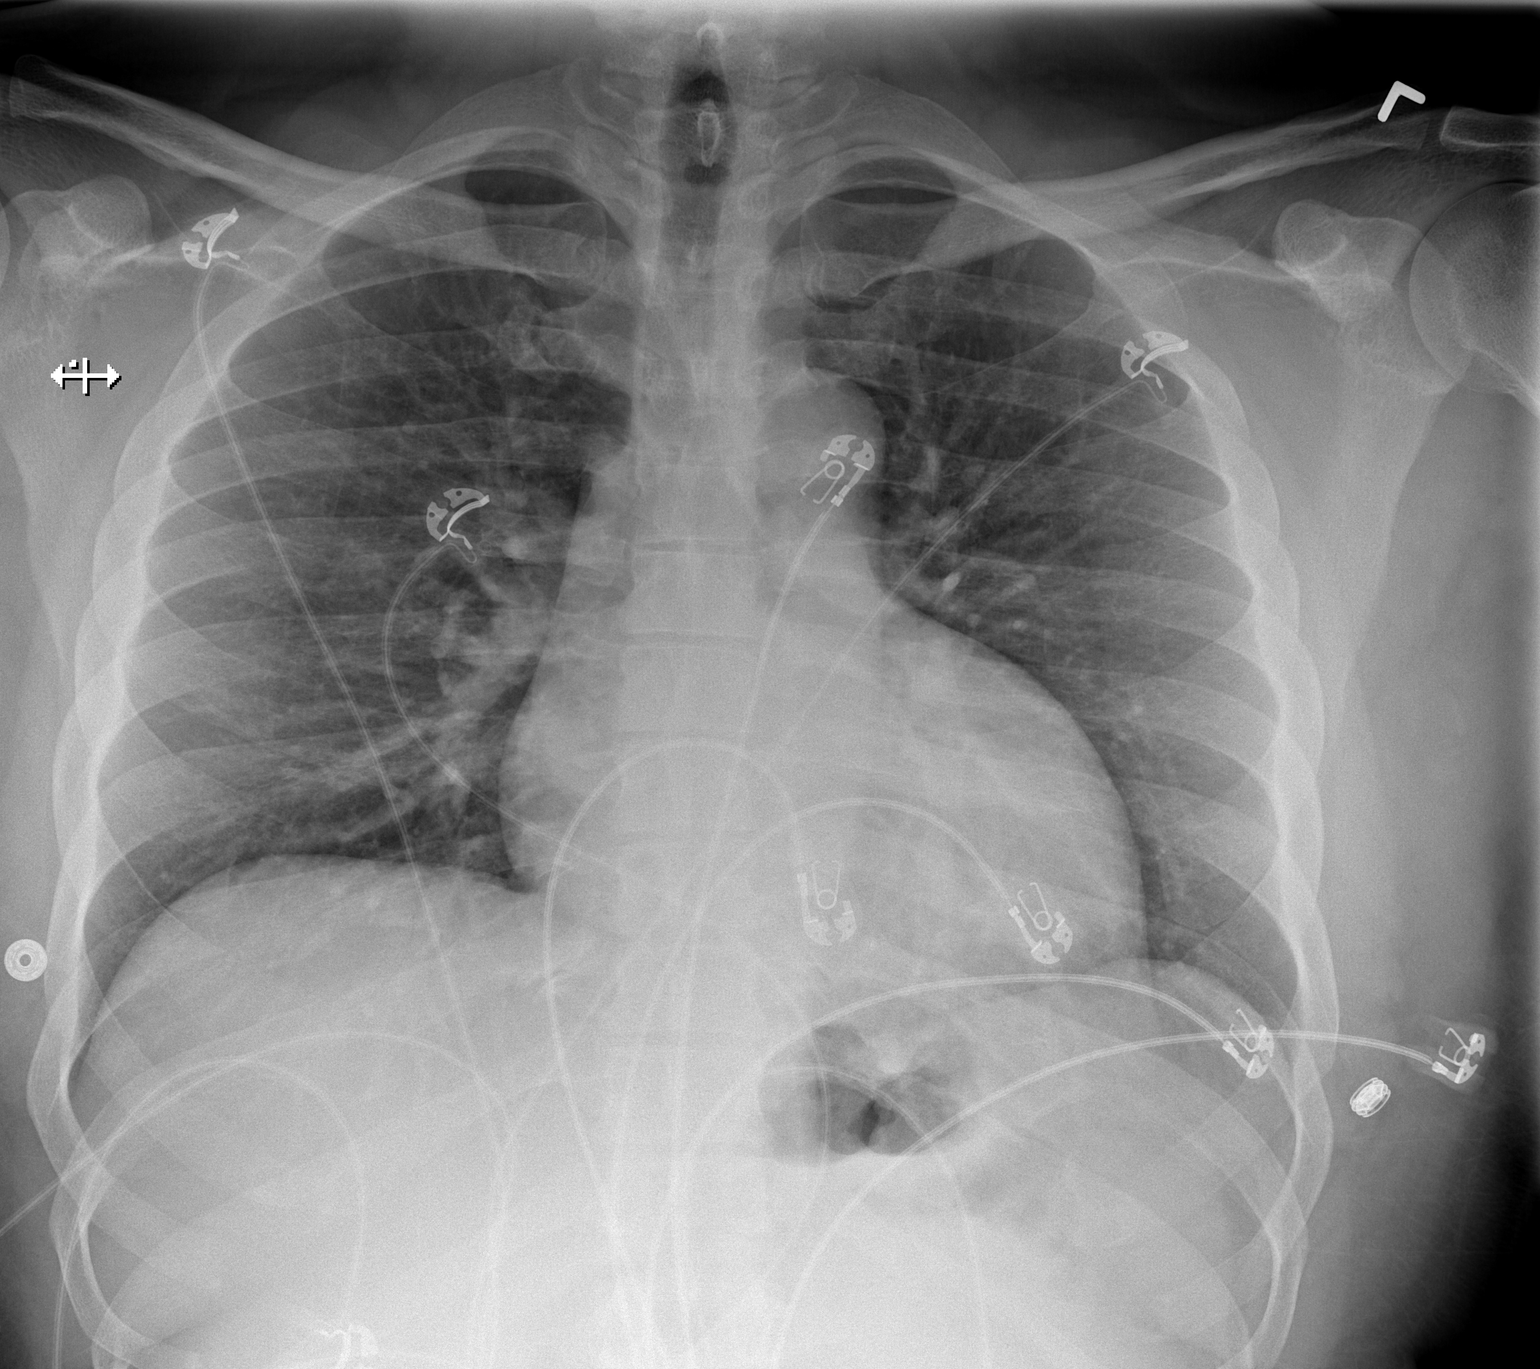

[w chest lat]
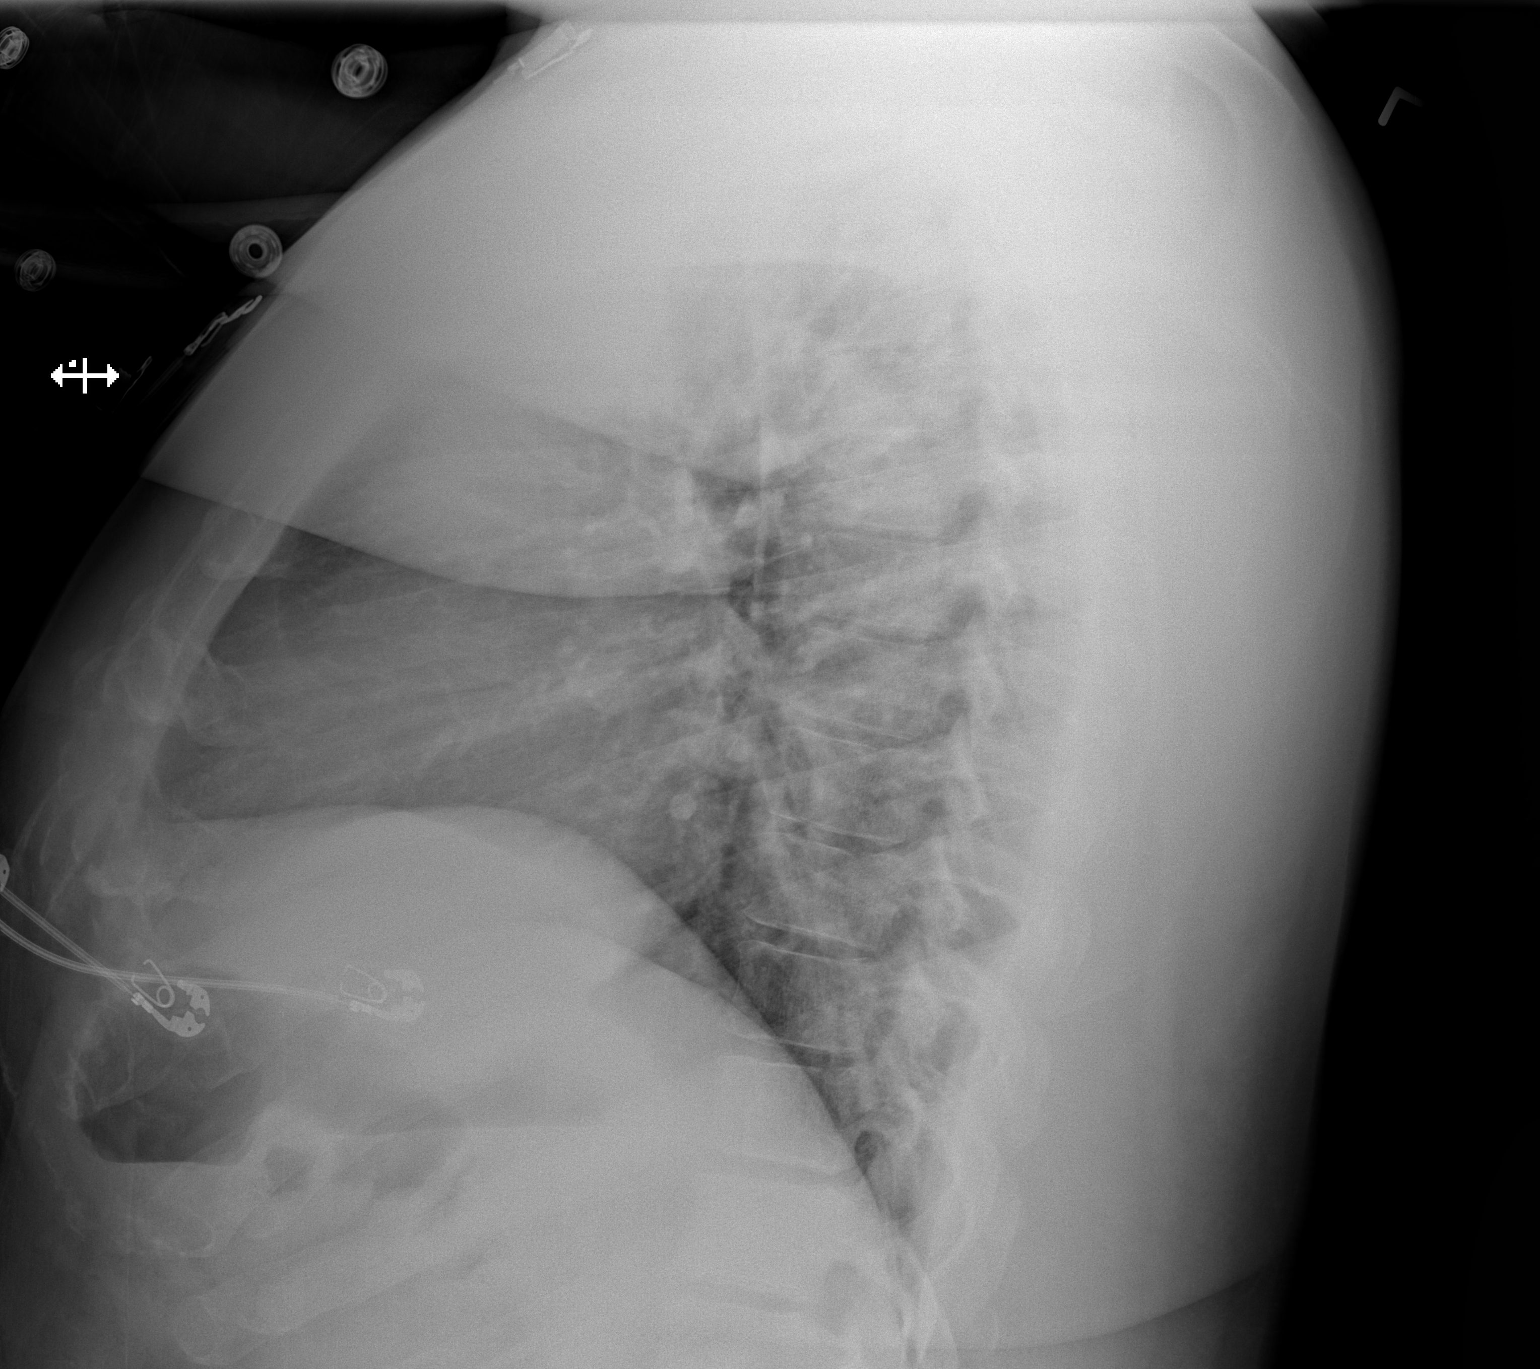

[2 of 2 positions shown; findings below may reference images not displayed]

FINDINGS: Unchanged mild cardiomegaly. Normal mediastinal contours. Normal
pulmonary vascularity. No focal consolidation, pleural effusion, or
pneumothorax. No acute osseous abnormality.
IMPRESSION: 1. Stable mild cardiomegaly.  No active cardiopulmonary disease.

## 2021-01-17 ENCOUNTER — Other Ambulatory Visit (HOSPITAL_COMMUNITY): Payer: Self-pay | Admitting: Cardiology

## 2021-01-30 ENCOUNTER — Other Ambulatory Visit: Payer: Self-pay | Admitting: Family Medicine

## 2021-01-30 DIAGNOSIS — E1165 Type 2 diabetes mellitus with hyperglycemia: Secondary | ICD-10-CM

## 2021-02-17 ENCOUNTER — Telehealth: Payer: Self-pay

## 2021-02-17 NOTE — Telephone Encounter (Signed)
Received staff message from Dr. Rachael Darby to schedule patient follow up appointment for INR check.   Called patient, no answer, no ability to LVM.   If patient calls back, please schedule appointment for INR check.   Veronda Prude, RN

## 2021-04-01 ENCOUNTER — Telehealth: Payer: Self-pay

## 2021-04-01 NOTE — Telephone Encounter (Signed)
Attempted to reach patient to make appt for follow up on INR. LVM for patient to call the office to make that appt. Aquilla Solian, CMA

## 2021-04-01 NOTE — Telephone Encounter (Signed)
-----   Message from [name], DO sent at 01/21/2021  1:06 AM EDT ----- Regarding: FW: Coumadin patient Hi Team!   I was forwarded a Coumadin request for this patient. He needs an appointment prior to his next Coumadin refill. I sent a two week supply recently and he has not yet followed up with me or his cardiologist.   Please call pt to schedule an appointment.   Thanks,   -V     ----- Message ----- From: [name], DO Sent: 01/16/2021  10:15 AM EDT To: [facility] Team Subject: Coumadin patient                               Can someone call this patient to get him scheduled with Dr. Nicholaus Bloom in the Coumadin clinic. Please let him know I will not be refilling his Coumadin without appropriate follow up. He most recently got a 14 day supply.  He missed his cardiology appointment as well.    Thanks,  -V    ----- Message ----- From: Doreene Eland, MD Sent: 01/08/2021   9:43 AM EDT To: [name], DO, Cheral Almas, RPH-CPP  Also let your staff reach out to him to inform him that you may not continue to refill his Coumadin without appropriate follow-up. Have them help him schedule follow-up with you or Cheral Almas who is now in charge of our Coumadin clinic.  I have copied her on this message. ----- Message ----- From: [name], DO Sent: 01/07/2021   5:47 PM EDT To: Laurey Morale, MD, Doreene Eland, MD  Hi Dr. [name],  We have a mutual patient who is taking Warfarin. He has not been following up regularly for his INR appointments here.  He was seen in the ED and had a subtherapeutic INR.  I want to start him on Eliquis/Xarelto instead.  He has an upcoming appt with you next week (10/10). If you agree, can you start him on a different oral anticoagulant?  Regards,  [name], DO

## 2021-04-03 NOTE — Telephone Encounter (Signed)
2nd attempt to reach patient to have INR check. No answer. LVM for patient to call the office to have this done. Aquilla Solian, CMA

## 2021-04-18 ENCOUNTER — Encounter (HOSPITAL_COMMUNITY): Payer: Self-pay

## 2021-04-18 ENCOUNTER — Emergency Department (HOSPITAL_COMMUNITY)
Admission: EM | Admit: 2021-04-18 | Discharge: 2021-04-18 | Discharge status: Against Medical Advice | Payer: Medicare Other | Source: Home / Self Care

## 2021-04-18 ENCOUNTER — Other Ambulatory Visit: Payer: Self-pay

## 2021-04-18 ENCOUNTER — Ambulatory Visit (HOSPITAL_COMMUNITY)
Admission: EM | Admit: 2021-04-18 | Discharge: 2021-04-18 | Disposition: A | Discharge status: Home / Self Care | Payer: Medicare Other | Attending: Student | Admitting: Student

## 2021-04-18 DIAGNOSIS — L03011 Cellulitis of right finger: Secondary | ICD-10-CM

## 2021-04-18 MED ORDER — CEPHALEXIN 500 MG PO CAPS: 500.0000 mg | ORAL_CAPSULE | Freq: Four times a day (QID) | ORAL | 0 refills | Status: DC

## 2021-04-18 MED ORDER — LIDOCAINE-EPINEPHRINE-TETRACAINE (LET) TOPICAL GEL
TOPICAL | Status: AC
  Filled 2021-04-18: qty 3

## 2021-04-18 MED ORDER — LIDOCAINE-EPINEPHRINE-TETRACAINE (LET) TOPICAL GEL
3.0000 mL | Freq: Once | TOPICAL | Status: AC
  Administered 2021-04-18: 3 mL via TOPICAL

## 2021-04-18 NOTE — Discharge Instructions (Addendum)
-  Start the antibiotic: Keflex, 4x daily x5 days. You can take this with food if you have a sensitive stomach. -Wash with gentle soap and water 1-2x daily -Warm compress to help pus come put -Follow-up if pain and swelling getting worse instead of better

## 2021-04-18 NOTE — ED Triage Notes (Signed)
Reports  right thumb pain x 2 days.
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
## 2021-05-14 ENCOUNTER — Other Ambulatory Visit (HOSPITAL_COMMUNITY): Payer: Self-pay

## 2021-05-14 MED ORDER — ENTRESTO 24-26 MG PO TABS: 1.0000 | ORAL_TABLET | Freq: Two times a day (BID) | ORAL | 3 refills | Status: DC

## 2021-06-24 ENCOUNTER — Ambulatory Visit (INDEPENDENT_AMBULATORY_CARE_PROVIDER_SITE_OTHER): Payer: Medicare Other | Admitting: Family Medicine

## 2021-06-24 ENCOUNTER — Other Ambulatory Visit: Payer: Self-pay

## 2021-06-24 DIAGNOSIS — Z7901 Long term (current) use of anticoagulants: Secondary | ICD-10-CM

## 2021-06-24 DIAGNOSIS — I513 Intracardiac thrombosis, not elsewhere classified: Secondary | ICD-10-CM | POA: Diagnosis not present

## 2021-06-24 LAB — POCT INR: INR: 1 — AB (ref 2.0–3.0)

## 2021-06-24 NOTE — Progress Notes (Signed)
Spoke with Kenneth Robertson in the lab. ? ?I urged him to make an appointment to discuss the risks and possible benefits of warfarin. ? ?Briefly reviewing his chart it seems he has not had an INR here in 2 years.   Warfarin was started for a cardiac thrombus in 2018.  The last discussion indicated he was to follow up with cardiology to discuss continuing warfarin and the most recent echo did not show a thrombus. ? ?Kenneth Fleece asked if I would prescribe him warfarin.  I told him I feel the risks of taking such a dangerous drug for an uncertain indication would be poor care. ? ?He made an appointment to see his PCP tomorrow.   ?

## 2021-06-25 ENCOUNTER — Ambulatory Visit: Payer: Medicare Other | Admitting: Family Medicine

## 2021-06-25 ENCOUNTER — Encounter: Payer: Self-pay | Admitting: Family Medicine

## 2021-06-25 NOTE — Progress Notes (Deleted)
? ?  SUBJECTIVE:  ? ?CHIEF COMPLAINT / HPI:  ? ?No chief complaint on file. ? ? ? ?Kenneth Robertson is a 41 y.o. male here for ***  ? ?Pt reports ***  ? ? ?PERTINENT  PMH / PSH: reviewed and updated as appropriate  ? ?OBJECTIVE:  ? ?There were no vitals taken for this visit.  ?*** ? ?ASSESSMENT/PLAN:  ? ?No problem-specific Assessment & Plan notes found for this encounter. ?  ? ? ?Lyndee Hensen, DO ?PGY-3, [facility] ?06/25/2021  ? ? ? ? ?{    This will disappear when note is signed, click to select method of visit    :1} ? ? ? ?

## 2021-07-15 ENCOUNTER — Ambulatory Visit: Payer: Medicare Other | Admitting: Family Medicine

## 2021-07-17 ENCOUNTER — Other Ambulatory Visit (HOSPITAL_COMMUNITY): Payer: Self-pay | Admitting: Cardiology

## 2021-07-23 ENCOUNTER — Ambulatory Visit: Payer: Medicare Other | Admitting: Family Medicine

## 2021-07-27 ENCOUNTER — Ambulatory Visit: Payer: Medicare Other | Admitting: Family Medicine

## 2021-07-28 ENCOUNTER — Other Ambulatory Visit (HOSPITAL_COMMUNITY): Payer: Self-pay | Admitting: *Deleted

## 2021-07-28 DIAGNOSIS — I5022 Chronic systolic (congestive) heart failure: Secondary | ICD-10-CM

## 2021-08-03 ENCOUNTER — Other Ambulatory Visit: Payer: Self-pay

## 2021-08-03 ENCOUNTER — Ambulatory Visit (HOSPITAL_BASED_OUTPATIENT_CLINIC_OR_DEPARTMENT_OTHER)
Admission: RE | Admit: 2021-08-03 | Discharge: 2021-08-03 | Disposition: A | Discharge status: Home / Self Care | Payer: Medicare Other | Source: Ambulatory Visit | Attending: Cardiology | Admitting: Cardiology

## 2021-08-03 ENCOUNTER — Ambulatory Visit (HOSPITAL_COMMUNITY)
Admission: RE | Admit: 2021-08-03 | Discharge: 2021-08-03 | Disposition: A | Discharge status: Home / Self Care | Payer: Medicare Other | Source: Ambulatory Visit | Attending: Cardiology | Admitting: Cardiology

## 2021-08-03 VITALS — BP 112/86 | HR 69 | Ht 70.0 in | Wt 253.2 lb

## 2021-08-03 DIAGNOSIS — I5022 Chronic systolic (congestive) heart failure: Secondary | ICD-10-CM | POA: Diagnosis not present

## 2021-08-03 DIAGNOSIS — Z7984 Long term (current) use of oral hypoglycemic drugs: Secondary | ICD-10-CM | POA: Diagnosis not present

## 2021-08-03 DIAGNOSIS — E1165 Type 2 diabetes mellitus with hyperglycemia: Secondary | ICD-10-CM

## 2021-08-03 DIAGNOSIS — Z79899 Other long term (current) drug therapy: Secondary | ICD-10-CM | POA: Diagnosis not present

## 2021-08-03 DIAGNOSIS — E785 Hyperlipidemia, unspecified: Secondary | ICD-10-CM | POA: Diagnosis not present

## 2021-08-03 DIAGNOSIS — I340 Nonrheumatic mitral (valve) insufficiency: Secondary | ICD-10-CM | POA: Diagnosis not present

## 2021-08-03 DIAGNOSIS — I428 Other cardiomyopathies: Secondary | ICD-10-CM | POA: Diagnosis not present

## 2021-08-03 DIAGNOSIS — I110 Hypertensive heart disease with heart failure: Secondary | ICD-10-CM | POA: Insufficient documentation

## 2021-08-03 DIAGNOSIS — Z7901 Long term (current) use of anticoagulants: Secondary | ICD-10-CM | POA: Diagnosis not present

## 2021-08-03 LAB — ECHOCARDIOGRAM COMPLETE
Area-P 1/2: 3.42 cm2
Calc EF: 34.8 %
S' Lateral: 5.2 cm
Single Plane A2C EF: 25.2 %
Single Plane A4C EF: 40 %

## 2021-08-03 LAB — CBC
HCT: 44.1 % (ref 39.0–52.0)
Hemoglobin: 15.5 g/dL (ref 13.0–17.0)
MCH: 29.2 pg (ref 26.0–34.0)
MCHC: 35.1 g/dL (ref 30.0–36.0)
MCV: 83.2 fL (ref 80.0–100.0)
Platelets: 313 K/uL (ref 150–400)
RBC: 5.3 MIL/uL (ref 4.22–5.81)
RDW: 13.2 % (ref 11.5–15.5)
WBC: 3.1 K/uL — ABNORMAL LOW (ref 4.0–10.5)
nRBC: 0 % (ref 0.0–0.2)

## 2021-08-03 LAB — BASIC METABOLIC PANEL WITH GFR
Anion gap: 6 (ref 5–15)
BUN: 11 mg/dL (ref 6–20)
CO2: 27 mmol/L (ref 22–32)
Calcium: 9.2 mg/dL (ref 8.9–10.3)
Chloride: 102 mmol/L (ref 98–111)
Creatinine, Ser: 1.21 mg/dL (ref 0.61–1.24)
GFR, Estimated: >60 mL/min
Glucose, Bld: 101 mg/dL — ABNORMAL HIGH (ref 70–99)
Potassium: 4.2 mmol/L (ref 3.5–5.1)
Sodium: 135 mmol/L (ref 135–145)

## 2021-08-03 LAB — DIGOXIN LEVEL: Digoxin Level: 0.2 ng/mL — ABNORMAL LOW (ref 0.8–2.0)

## 2021-08-03 MED ORDER — RIVAROXABAN 20 MG PO TABS: 20.0000 mg | ORAL_TABLET | Freq: Every day | ORAL | 3 refills | Status: DC

## 2021-08-03 MED ORDER — FUROSEMIDE 20 MG PO TABS: 20.0000 mg | ORAL_TABLET | ORAL | 1 refills | Status: DC

## 2021-08-03 MED ORDER — DAPAGLIFLOZIN PROPANEDIOL 10 MG PO TABS: 10.0000 mg | ORAL_TABLET | Freq: Every day | ORAL | 3 refills | Status: DC

## 2021-08-03 NOTE — Progress Notes (Signed)
?  ? ? ?Date:  08/03/2021  ? ?ID:  Kenneth Robertson, DOB September 28, 1980, MRN [id number]    ?Provider location: [address], [place] ?Type of Visit: Established patient ? ?PCP:  [name], DO  ?Cardiologist:  None ?Primary HF: Dr. [name] ?  ?History of Present Illness: ?Kenneth Robertson is a 41 y.o. male  with history of HTN and nonischemic cardiomyopathy who presents for followup of CHF.  He was admitted in 12/17 after 1 month of worsening dyspnea.  He was volume overloaded on exam with EF 10-15% on echo.  He was diuresed and RHC/LHC done.  This showed no CAD. Cardiac output was low.  Cardiac MRI in 4/18 showed severe LV dilation, EF 24%, prominent trabeculation (possible noncompaction), RV insertion LGE (nonspecific).   ?  ?Echo in 6/20 showed that LV was severely dilated with EF 25-30%.  CPX done in 7/19 surprisingly did not show a significant CHF limitation.  He saw Dr Graciela Husbands and ICD was recommended but he deferred.  ? ?Echo in 4/21 showed EF 35-40%, diffuse hypokinesis, normal RV.  Echo was done today and reviewed, EF 30-35%, moderate LV dilation, mildly decreased RV systolic function.  ?  ?Weight is down 12 lbs since last appointment, but he has not been to the office for about a year now.  He stopped taking [place], he thinks that it made him feel "sick."  He was not going for INR checks (for about year!) so his PCP stopped his warfarin.  He says that he has been taking his other meds.  No dyspnea walking on flat ground.  No chest pain.  No orthopnea/PND.  He had an episode of lightheadedness in March, but this was related to very high glucose.  ? ?ECG (personally reviewed): NSR, LAFB ? ?Labs (7/18): K 4.3, creatinine 1.4 ?Labs (2/19): K 3.9, creatinine 1.43, digoxin < 0.2 ?Labs (5/19): K 4, creatinine 1.21, hgb 15.7, digoxin 0.5 ?Labs (7/19): HIV negative, digoxin 0.3 ?Labs (8/19): K 4, creatinine 1.3 ?Labs (9/19): K 4.1, creatinine 1.33 ?Labs (1/20): HIV negative, K 3.8, creatinine 1.26, digoxin  0.4 ?Labs (5/20): hgb 15.7, K 4.2, creatinine 1.22, digoxin 0.7 ?Labs (9/20): K 4.3, creatinine 1.25, HIV negative, digoxin 0.8 ?Labs (1/21): K 4.1, creatinine 1.27, digoxin 0.6 ?Labs (3/22): LDL 158, K 4.3, creatinine 1.2 ?Labs (4/22): K 4, creatinine 1.2 ?Labs (9/22): K 4, creatinine 1.2 ?  ?PMH: ?1. HTN ?2. Depression ?3. Chronic systolic CHF: Nonischemic cardiomyopathy, diagnosed in 12/17.  ?- Echo (12/17) with EF 10-15%, LV thrombus, moderate MR, PASP 47 mmHg, moderately dilated and dysfunctional RV.  ?- Echo (7/18) with EF 15%, moderate MR. PASP 40 mm Hg.  ?- LHC/RHC (12/17): No CAD; mean RA 8, PA 41/22, mean PCWP 20, CI 1.7.  ?- HIV, SPEP, TSH negative.  ?- Cardiac MRI in 4/18 showed severe LV dilation, EF 24%, prominent trabeculation (possible noncompaction), RV insertion LGE (nonspecific), no LV thrombus.   ?- Echo (7/18): EF 15%, severe LV dilation with diffuse HK, grade 3 diastolic dysfunction, mild-moderate MR, PASP 40 mmHg.  ?- Echo (5/19): EF 25-30%, severe LV dilation with diffuse hypokinesis, mild MR, mild RV dilation with normal systolic function.  ?- CPX (7/19): RER 1.08, peak VO2 30.2, VE/VCO2 slope 31 => no obvious cardiac limitation.  ?- Echo (6/20): EF 25-30%, severe LV dilation with diffuse hypokinesis, mild MR, normal RV size and systolic function.  ?- Echo (4/21): EF 35-40%, diffuse hypokinesis, normal RV size and systolic function.  ?-  Echo (4/23): EF 30-35%, moderate LV dilation, mildly decreased RV systolic function.  ?4. LV thrombus: On warfarin.  ?5. Sleep study with no OSA.  ?6. Type II diabetes ?7. Hyperlipidemia ? ?Past Surgical History:  ?Procedure Laterality Date  ? CARDIAC CATHETERIZATION N/A 04/02/2016  ? Procedure: Right/Left Heart Cath and Coronary Angiography;  Surgeon: Laurey Morale, MD;  Location: [facility] INVASIVE CV LAB;  Service: Cardiovascular;  Laterality: N/A;  ? EYE SURGERY    ? Left   ? TOOTH EXTRACTION    ? ? ? ?Current Outpatient Medications  ?Medication Sig Dispense  Refill  ? acetaminophen (TYLENOL) 500 MG tablet Take 1 tablet (500 mg total) by mouth every 6 (six) hours as needed. 30 tablet 0  ? carvedilol (COREG) 12.5 MG tablet TAKE 1 TABLET BY MOUTH 2 TIMES DAILY. 180 tablet 3  ? digoxin (LANOXIN) 0.125 MG tablet TAKE 1 TABLET BY MOUTH EVERY DAY 90 tablet 3  ? hydrALAZINE (APRESOLINE) 25 MG tablet Take 1 tablet (25 mg total) by mouth 3 (three) times daily. 270 tablet 3  ? isosorbide mononitrate (IMDUR) 30 MG 24 hr tablet TAKE 1 TABLET BY MOUTH EVERY DAY 90 tablet 3  ? KLOR-CON M20 20 MEQ tablet TAKE 1 TABLET (20 MEQ TOTAL) DAILY BY MOUTH. 90 tablet 3  ? rivaroxaban (XARELTO) 20 MG TABS tablet Take 1 tablet (20 mg total) by mouth daily with supper. 90 tablet 3  ? rosuvastatin (CRESTOR) 20 MG tablet Take 1 tablet (20 mg total) by mouth daily. 90 tablet 3  ? sacubitril-valsartan (ENTRESTO) 24-26 MG Take 1 tablet by mouth 2 (two) times daily. 180 tablet 3  ? spironolactone (ALDACTONE) 25 MG tablet Take 1 tablet (25 mg total) by mouth daily. 90 tablet 3  ? dapagliflozin propanediol (FARXIGA) 10 MG TABS tablet Take 1 tablet (10 mg total) by mouth daily before breakfast. 90 tablet 3  ? furosemide (LASIX) 20 MG tablet Take 1 tablet (20 mg total) by mouth every other day. 90 tablet 1  ? ?No current facility-administered medications for this encounter.  ? ? ?Allergies:   Hydrocodone and Shrimp [shellfish allergy]  ? ?Social History:  The patient  reports that he has never smoked. He has never used smokeless tobacco. He reports that he does not drink alcohol and does not use drugs.  ? ?Family History:  The patient's family history includes Cancer in his father; Heart failure in his mother.  ? ?ROS:  Please see the history of present illness.   All other systems are personally reviewed and negative.  ? ?Exam:   ?BP 112/86   Pulse 69   Ht 5\' 10"  (1.778 m)   Wt 114.9 kg (253 lb 3.2 oz)   SpO2 98%   BMI 36.33 kg/m?  ?General: NAD ?Neck: No JVD, no thyromegaly or thyroid nodule.   ?Lungs: Clear to auscultation bilaterally with normal respiratory effort. ?CV: Nondisplaced PMI.  Heart regular S1/S2, no S3/S4, no murmur.  No peripheral edema.  No carotid bruit.  Normal pedal pulses.  ?Abdomen: Soft, nontender, no hepatosplenomegaly, no distention.  ?Skin: Intact without lesions or rashes.  ?Neurologic: Alert and oriented x 3.  ?Psych: Normal affect. ?Extremities: No clubbing or cyanosis.  ?HEENT: Normal.  ? ?Recent Labs: ?08/03/2021: BUN 11; Creatinine, Ser 1.21; Hemoglobin 15.5; Platelets 313; Potassium 4.2; Sodium 135  ?Personally reviewed  ? ?Wt Readings from Last 3 Encounters:  ?08/03/21 114.9 kg (253 lb 3.2 oz)  ?01/02/21 116.1 kg (256 lb)  ?10/03/20 116.2 kg (256  lb 4 oz)  ?  ? ?ASSESSMENT AND PLAN: ? ?1. Chronic systolic CHF: Nonischemic cardiomyopathy.  EF 10-15% by echo in 12/17.  No coronary disease and low output on cath.  HIV negative, SPEP negative.  No family history of early cardiomyopathy (mother with CHF around 73).  No ETOH/drugs. Possible viral myocarditis.  Cardiac MRI in 4/18 with severe LV dilation, EF 24%, RV insertion site LGE (nonspecific), cannot rule out noncompaction.  Echo in 5/19 showed EF 25-30% with severe LV dilation. CPX in 7/19 was quite good with minimal cardiac limitation.  Echo in 6/20 showed severe LV dilation and EF 25-30%, normal RV systolic function.  Echo in 4/21 showed EF improved to 35-40%.  Echo (4/23) showed  EF 30-35%, moderate LV dilation, mildly decreased RV systolic function.  NYHA class II, not volume overloaded on exam.  ?- He did not tolerate increase in Entresto to 49/51 bid, continue 24/26 bid.  BMET today.  ?- Continue Coreg 12.5 mg bid.   ?- Continue digoxin 0.125 and check level.   ?- Continue spironolactone 25 mg daily.  ?- Continue current hydralazine/Imdur.  ?- He wants to retry Farxiga, will start 10 mg daily and decrease Lasix to 20 mg qod.  BMET today and again in 10 days.  ?- EF is within ICD range.  He is not a CRT candidate with  narrow QRS.  We discussed ICD placement today, he wants to think longer about it.  ?2. LV thrombus: Not seen on more recent studies, including today.  However, LV remains weak.  He was taken off warfarin by PCP because

## 2021-08-03 NOTE — Patient Instructions (Signed)
Medication Changes: ? ?Start Xarelto 20 mg daily ? ?Change lasix to every other day ? ?Lab Work: ? ?Labs done today, your results will be available in MyChart, we will contact you for abnormal readings. ? ? ?Testing/Procedures: ? ?Repeat blood work in 10 days ? ?Referrals: ? ?none ? ?Special Instructions // Education: ? ?none ? ?Follow-Up in: 6 weeks  ? ?At the [facility], you and your health needs are our priority. We have a designated team specialized in the treatment of Heart Failure. This Care Team includes your primary Heart Failure Specialized Cardiologist (physician), Advanced Practice Providers (APPs- Physician Assistants and Nurse Practitioners), and Pharmacist who all work together to provide you with the care you need, when you need it.  ? ?You may see any of the following providers on your designated Care Team at your next follow up: ? ?Dr Arvilla Meres ?Dr Marca Ancona ?Tonye Becket, NP ?Robbie Lis, PA ?Jessica Milford,NP ?Anna Genre, PA ?Karle Plumber, PharmD ? ? ?Please be sure to bring in all your medications bottles to every appointment.  ? ?Need to Contact us: ? ?If you have any questions or concerns before your next appointment please send [place] a message through [place] or call our office at [phone].   ? ?TO LEAVE A MESSAGE FOR THE NURSE SELECT OPTION 2, PLEASE LEAVE A MESSAGE INCLUDING: ?YOUR NAME ?DATE OF BIRTH ?CALL BACK NUMBER ?REASON FOR CALL**this is important as we prioritize the call backs ? ?YOU WILL RECEIVE A CALL BACK THE SAME DAY AS LONG AS YOU CALL BEFORE 4:00 PM ? ? ?

## 2021-08-13 ENCOUNTER — Other Ambulatory Visit (HOSPITAL_COMMUNITY): Payer: Medicare Other

## 2021-09-08 ENCOUNTER — Encounter: Payer: Self-pay | Admitting: *Deleted

## 2021-09-11 ENCOUNTER — Telehealth (HOSPITAL_COMMUNITY): Payer: Self-pay

## 2021-09-11 NOTE — Telephone Encounter (Signed)
Called to confirm/remind patient of their appointment at the [facility] on 09/14/21.   Patient reminded to bring all medications and/or complete list.  Confirmed patient has transportation. Gave directions, instructed to utilize valet parking.  Confirmed appointment prior to ending call.   

## 2021-09-14 ENCOUNTER — Ambulatory Visit (HOSPITAL_COMMUNITY)
Admission: RE | Admit: 2021-09-14 | Discharge: 2021-09-14 | Disposition: A | Discharge status: Home / Self Care | Payer: Medicare Other | Source: Ambulatory Visit | Attending: Family Medicine | Admitting: Family Medicine

## 2021-09-14 ENCOUNTER — Encounter (HOSPITAL_COMMUNITY): Payer: Self-pay

## 2021-09-14 VITALS — BP 104/78 | HR 90 | Wt 251.0 lb

## 2021-09-14 DIAGNOSIS — I428 Other cardiomyopathies: Secondary | ICD-10-CM | POA: Diagnosis not present

## 2021-09-14 DIAGNOSIS — I110 Hypertensive heart disease with heart failure: Secondary | ICD-10-CM | POA: Insufficient documentation

## 2021-09-14 DIAGNOSIS — E119 Type 2 diabetes mellitus without complications: Secondary | ICD-10-CM | POA: Insufficient documentation

## 2021-09-14 DIAGNOSIS — E785 Hyperlipidemia, unspecified: Secondary | ICD-10-CM

## 2021-09-14 DIAGNOSIS — Z7901 Long term (current) use of anticoagulants: Secondary | ICD-10-CM | POA: Diagnosis not present

## 2021-09-14 DIAGNOSIS — Z91148 Patient's other noncompliance with medication regimen for other reason: Secondary | ICD-10-CM | POA: Diagnosis not present

## 2021-09-14 DIAGNOSIS — Z79899 Other long term (current) drug therapy: Secondary | ICD-10-CM | POA: Diagnosis not present

## 2021-09-14 DIAGNOSIS — Z86718 Personal history of other venous thrombosis and embolism: Secondary | ICD-10-CM | POA: Insufficient documentation

## 2021-09-14 DIAGNOSIS — I513 Intracardiac thrombosis, not elsewhere classified: Secondary | ICD-10-CM

## 2021-09-14 DIAGNOSIS — I5022 Chronic systolic (congestive) heart failure: Secondary | ICD-10-CM

## 2021-09-14 LAB — BASIC METABOLIC PANEL
Anion gap: 9 (ref 5–15)
BUN: 14 mg/dL (ref 6–20)
CO2: 22 mmol/L (ref 22–32)
Calcium: 9.1 mg/dL (ref 8.9–10.3)
Chloride: 105 mmol/L (ref 98–111)
Creatinine, Ser: 1.32 mg/dL — ABNORMAL HIGH (ref 0.61–1.24)
GFR, Estimated: >60 mL/min (ref >60)
Glucose, Bld: 151 mg/dL — ABNORMAL HIGH (ref 70–99)
Potassium: 4.1 mmol/L (ref 3.5–5.1)
Sodium: 136 mmol/L (ref 135–145)

## 2021-09-14 NOTE — Patient Instructions (Addendum)
Thank you for coming in today ? ?Labs were done today, if any labs are abnormal the clinic will call you ?No news is good news  ? ?Your physician recommends that you schedule a follow-up appointment in:  ?3 months with Dr. Mclean ? ?At the [facility], you and your health needs are our priority. As part of our continuing mission to provide you with exceptional heart care, we have created designated Provider Care Teams. These Care Teams include your primary Cardiologist (physician) and Advanced Practice Providers (APPs- Physician Assistants and Nurse Practitioners) who all work together to provide you with the care you need, when you need it.  ? ?You may see any of the following providers on your designated Care Team at your next follow up: ?Dr Daniel Bensimhon ?Dr Dalton McLean ?Amy Clegg, NP ?Brittainy Simmons, PA ?Jessica Milford,NP ?Lindsay Finch, PA ?Lauren Kemp, PharmD ? ? ?Please be sure to bring in all your medications bottles to every appointment.  ? ?If you have any questions or concerns before your next appointment please send us a message through mychart or call our office at [phone].   ? ?TO LEAVE A MESSAGE FOR THE NURSE SELECT OPTION 2, PLEASE LEAVE A MESSAGE INCLUDING: ?YOUR NAME ?DATE OF BIRTH ?CALL BACK NUMBER ?REASON FOR CALL**this is important as we prioritize the call backs ? ?YOU WILL RECEIVE A CALL BACK THE SAME DAY AS LONG AS YOU CALL BEFORE 4:00 PM ? ? ?

## 2021-09-14 NOTE — Progress Notes (Signed)
Date:  09/14/2021   ID:  Kenneth Robertson, DOB June 13, 1980, MRN [id number]    Provider location: [address], [place] Type of Visit: Established patient  PCP:  [name], DO  Primary HF: Dr. [name]   History of Present Illness: Kenneth Robertson is a 41 y.o. male  with history of HTN and nonischemic cardiomyopathy who presents for followup of CHF.  He was admitted in 12/17 after 1 month of worsening dyspnea.  He was volume overloaded on exam with EF 10-15% on echo.  He was diuresed and RHC/LHC done.  This showed no CAD. Cardiac output was low.  Cardiac MRI in 4/18 showed severe LV dilation, EF 24%, prominent trabeculation (possible noncompaction), RV insertion LGE (nonspecific).     Echo in 6/20 showed that LV was severely dilated with EF 25-30%.  CPX done in 7/19 surprisingly did not show a significant CHF limitation.  He saw Dr Graciela Husbands and ICD was recommended but he deferred.   Echo in 4/21 showed EF 35-40%, diffuse hypokinesis, normal RV.  Echo 5/23 showed EF 30-35%, moderate LV dilation, mildly decreased RV systolic function.    Follow up 5/23, had not been in office in about a year. He stopped taking [place]. PCP stopped warfarin as he was noncompliant with INR checks. NYHA II, not volume overloaded. [name] restarted. Xarelto started.  Today he returns for HF follow up. Overall feeling fine. He did not tolerate Farxiga re-challenge and has been off x 3 weeks. He is not short of breath walking, going up stairs or lifting groceries. Denies palpitations, abnormal bleeding, CP, dizziness, edema, or PND/Orthopnea. Appetite ok. No fever or chills. He does not weigh at home. Taking all medications.   ECG (personally reviewed): none ordered today.  Labs (7/18): K 4.3, creatinine 1.4 Labs (2/19): K 3.9, creatinine 1.43, digoxin < 0.2 Labs (5/19): K 4, creatinine 1.21, hgb 15.7, digoxin 0.5 Labs (7/19): HIV negative, digoxin 0.3 Labs (8/19): K 4, creatinine 1.3 Labs (9/19): K  4.1, creatinine 1.33 Labs (1/20): HIV negative, K 3.8, creatinine 1.26, digoxin 0.4 Labs (5/20): hgb 15.7, K 4.2, creatinine 1.22, digoxin 0.7 Labs (9/20): K 4.3, creatinine 1.25, HIV negative, digoxin 0.8 Labs (1/21): K 4.1, creatinine 1.27, digoxin 0.6 Labs (3/22): LDL 158, K 4.3, creatinine 1.2 Labs (4/22): K 4, creatinine 1.2 Labs (9/22): K 4, creatinine 1.2 Labs (5/23): K 4.2, creatinine 1.2, hgb 15.5   PMH: 1. HTN 2. Depression 3. Chronic systolic CHF: Nonischemic cardiomyopathy, diagnosed in 12/17.  - Echo (12/17) with EF 10-15%, LV thrombus, moderate MR, PASP 47 mmHg, moderately dilated and dysfunctional RV.  - Echo (7/18) with EF 15%, moderate MR. PASP 40 mm Hg.  - LHC/RHC (12/17): No CAD; mean RA 8, PA 41/22, mean PCWP 20, CI 1.7.  - HIV, SPEP, TSH negative.  - Cardiac MRI in 4/18 showed severe LV dilation, EF 24%, prominent trabeculation (possible noncompaction), RV insertion LGE (nonspecific), no LV thrombus.   - Echo (7/18): EF 15%, severe LV dilation with diffuse HK, grade 3 diastolic dysfunction, mild-moderate MR, PASP 40 mmHg.  - Echo (5/19): EF 25-30%, severe LV dilation with diffuse hypokinesis, mild MR, mild RV dilation with normal systolic function.  - CPX (7/19): RER 1.08, peak VO2 30.2, VE/VCO2 slope 31 => no obvious cardiac limitation.  - Echo (6/20): EF 25-30%, severe LV dilation with diffuse hypokinesis, mild MR, normal RV size and systolic function.  - Echo (4/21): EF 35-40%, diffuse hypokinesis, normal RV  size and systolic function.  - Echo (4/23): EF 30-35%, moderate LV dilation, mildly decreased RV systolic function.  4. LV thrombus: On warfarin.  5. Sleep study with no OSA.  6. Type II diabetes 7. Hyperlipidemia  Past Surgical History:  Procedure Laterality Date   CARDIAC CATHETERIZATION N/A 04/02/2016   Procedure: Right/Left Heart Cath and Coronary Angiography;  Surgeon: Laurey Morale, MD;  Location: [facility] INVASIVE CV LAB;  Service: Cardiovascular;   Laterality: N/A;   EYE SURGERY     Left    TOOTH EXTRACTION     Current Outpatient Medications  Medication Sig Dispense Refill   acetaminophen (TYLENOL) 500 MG tablet Take 1 tablet (500 mg total) by mouth every 6 (six) hours as needed. 30 tablet 0   carvedilol (COREG) 12.5 MG tablet TAKE 1 TABLET BY MOUTH 2 TIMES DAILY. 180 tablet 3   digoxin (LANOXIN) 0.125 MG tablet TAKE 1 TABLET BY MOUTH EVERY DAY 90 tablet 3   furosemide (LASIX) 20 MG tablet Take 1 tablet (20 mg total) by mouth every other day. 90 tablet 1   hydrALAZINE (APRESOLINE) 25 MG tablet Take 1 tablet (25 mg total) by mouth 3 (three) times daily. 270 tablet 3   isosorbide mononitrate (IMDUR) 30 MG 24 hr tablet TAKE 1 TABLET BY MOUTH EVERY DAY 90 tablet 3   KLOR-CON M20 20 MEQ tablet TAKE 1 TABLET (20 MEQ TOTAL) DAILY BY MOUTH. 90 tablet 3   rivaroxaban (XARELTO) 20 MG TABS tablet Take 1 tablet (20 mg total) by mouth daily with supper. 90 tablet 3   rosuvastatin (CRESTOR) 20 MG tablet Take 1 tablet (20 mg total) by mouth daily. 90 tablet 3   sacubitril-valsartan (ENTRESTO) 24-26 MG Take 1 tablet by mouth 2 (two) times daily. 180 tablet 3   spironolactone (ALDACTONE) 25 MG tablet Take 1 tablet (25 mg total) by mouth daily. 90 tablet 3   No current facility-administered medications for this encounter.   Allergies:   Hydrocodone and Shrimp [shellfish allergy]   Social History:  The patient  reports that he has never smoked. He has never used smokeless tobacco. He reports that he does not drink alcohol and does not use drugs.   Family History:  The patient's family history includes Cancer in his father; Heart failure in his mother.   ROS:  Please see the history of present illness.   All other systems are personally reviewed and negative.   Physical Exam:   BP 104/78   Pulse 90   Wt 113.9 kg (251 lb)   SpO2 96%   BMI 36.01 kg/m  General:  NAD. No resp difficulty HEENT: Normal Neck: Supple. No JVD. Carotids 2+ bilat; no  bruits. No lymphadenopathy or thryomegaly appreciated. Cor: PMI nondisplaced. Regular rate & rhythm. No rubs, gallops or murmurs. Lungs: Clear Abdomen: Soft, nontender, nondistended. No hepatosplenomegaly. No bruits or masses. Good bowel sounds. Extremities: No cyanosis, clubbing, rash, edema Neuro: Alert & oriented x 3, cranial nerves grossly intact. Moves all 4 extremities w/o difficulty. Affect pleasant.  Recent Labs: 08/03/2021: BUN 11; Creatinine, Ser 1.21; Hemoglobin 15.5; Platelets 313; Potassium 4.2; Sodium 135  Personally reviewed   Wt Readings from Last 3 Encounters:  09/14/21 113.9 kg (251 lb)  08/03/21 114.9 kg (253 lb 3.2 oz)  01/02/21 116.1 kg (256 lb)    Assessment & Plan: 1. Chronic systolic CHF: Nonischemic cardiomyopathy.  EF 10-15% by echo in 12/17.  No coronary disease and low output on cath.  HIV negative, SPEP  negative.  No family history of early cardiomyopathy (mother with CHF around 43).  No ETOH/drugs. Possible viral myocarditis.  Cardiac MRI in 4/18 with severe LV dilation, EF 24%, RV insertion site LGE (nonspecific), cannot rule out noncompaction.  Echo in 5/19 showed EF 25-30% with severe LV dilation. CPX in 7/19 was quite good with minimal cardiac limitation.  Echo in 6/20 showed severe LV dilation and EF 25-30%, normal RV systolic function.  Echo in 4/21 showed EF improved to 35-40%.  Echo (5/23) showed  EF 30-35%, moderate LV dilation, mildly decreased RV systolic function.  NYHA class II, not volume overloaded on exam.  - He did not tolerate Farxiga (nausea and fatigue). - Continue Entresto 24/26 bid (he did not tolerate 49/51).  BMET today.  - Continue Coreg 12.5 mg bid.   - Continue digoxin 0.125 mg daily. Recent dig level 0.2 (5/23). - Continue spironolactone 25 mg daily.  - Continue current hydralazine 25 mg tid/Imdur 30 mg daily. - Continue Lasix 20 mg qod. - EF is within ICD range.  He is not a CRT candidate with narrow QRS.  We discussed ICD placement  today, he wants to think longer about it.  2. LV thrombus: Not seen on more recent studies, including today.  However, LV remains weak.  He was taken off warfarin by PCP because of failure to comply with INR checks.    - Continue Xarelto 20 mg daily.  Data not as good for LV thrombus as warfarin, but with EF still low, would like him anticoagulated.  3. Hyperlipidemia:  Continue Crestor.  - Check lipids next visit  Follow up in 3 months with Dr. [name].   Signed, Jacklynn Ganong, FNP  09/14/2021  [facility] [address] [facility] [place] [address] [phone] (office) [phone] (fax)

## 2021-10-20 ENCOUNTER — Other Ambulatory Visit (HOSPITAL_COMMUNITY): Payer: Self-pay | Admitting: Cardiology

## 2021-11-27 ENCOUNTER — Other Ambulatory Visit (HOSPITAL_COMMUNITY): Payer: Self-pay | Admitting: Cardiology

## 2021-12-14 ENCOUNTER — Encounter (HOSPITAL_COMMUNITY): Payer: Medicare Other | Admitting: Cardiology

## 2022-01-19 ENCOUNTER — Encounter (HOSPITAL_COMMUNITY): Payer: Medicare Other | Admitting: Cardiology

## 2022-02-15 ENCOUNTER — Telehealth: Payer: Self-pay

## 2022-02-15 NOTE — Telephone Encounter (Signed)
Called and LVM asking for patient to please return my call. Patient is coming up on office reports but has never been seen here. Need to find out who current PCP is.   OK for P[facility] to speak to the patient if he calls back. Please find out the above if he calls back.

## 2022-05-23 ENCOUNTER — Emergency Department (HOSPITAL_COMMUNITY)
Admission: EM | Admit: 2022-05-23 | Discharge: 2022-05-23 | Disposition: A | Discharge status: Home / Self Care | Payer: 59 | Attending: Emergency Medicine | Admitting: Emergency Medicine

## 2022-05-23 ENCOUNTER — Other Ambulatory Visit: Payer: Self-pay

## 2022-05-23 DIAGNOSIS — Z7901 Long term (current) use of anticoagulants: Secondary | ICD-10-CM | POA: Insufficient documentation

## 2022-05-23 DIAGNOSIS — K0889 Other specified disorders of teeth and supporting structures: Secondary | ICD-10-CM | POA: Diagnosis not present

## 2022-05-23 DIAGNOSIS — I10 Essential (primary) hypertension: Secondary | ICD-10-CM | POA: Insufficient documentation

## 2022-05-23 DIAGNOSIS — Z79899 Other long term (current) drug therapy: Secondary | ICD-10-CM | POA: Diagnosis not present

## 2022-05-23 MED ORDER — PENICILLIN V POTASSIUM 500 MG PO TABS
500.0000 mg | ORAL_TABLET | Freq: Once | ORAL | Status: AC
  Administered 2022-05-23: 500 mg via ORAL
  Filled 2022-05-23: qty 1

## 2022-05-23 MED ORDER — PENICILLIN V POTASSIUM 500 MG PO TABS: 500.0000 mg | ORAL_TABLET | Freq: Three times a day (TID) | ORAL | 0 refills | Status: AC

## 2022-05-23 MED ORDER — OXYCODONE HCL 5 MG PO TABS
5.0000 mg | ORAL_TABLET | Freq: Once | ORAL | Status: AC
Start: 2022-05-23 — End: 2022-05-23
  Administered 2022-05-23: 5 mg via ORAL
  Filled 2022-05-23: qty 1

## 2022-05-23 NOTE — ED Provider Notes (Signed)
[place] EMERGENCY DEPARTMENT AT [facility] Provider Note   CSN: [id number] Arrival date & time: 05/23/22  0221     History  Chief Complaint  Patient presents with   Dental Pain    Kenneth Robertson is a 42 y.o. male.  Patient with nonischemic cardiomyopathy, thrombus in the heart on Xarelto, hypertension, depression presenting with left upper dental pain since 7 PM.  Denies any significant trauma.  No difficulty breathing or difficulty swallowing.  No chest pain or shortness of breath.  No fever.  No bleeding or drainage from tooth.  Taking Tylenol with partial relief.  States he will see his dentist on Monday.  The history is provided by the patient.  Dental Pain Associated symptoms: no fever and no headaches        Home Medications Prior to Admission medications   Medication Sig Start Date End Date Taking? Authorizing Provider  acetaminophen (TYLENOL) 500 MG tablet Take 1 tablet (500 mg total) by mouth every 6 (six) hours as needed. 01/02/21   Domenic Moras, PA-C  carvedilol (COREG) 12.5 MG tablet TAKE 1 TABLET BY MOUTH 2 TIMES DAILY. 07/17/21   Larey Dresser, MD  digoxin (LANOXIN) 0.125 MG tablet TAKE 1 TABLET BY MOUTH EVERY DAY 10/20/21   Larey Dresser, MD  furosemide (LASIX) 20 MG tablet Take 1 tablet (20 mg total) by mouth every other day. 08/03/21   Larey Dresser, MD  hydrALAZINE (APRESOLINE) 25 MG tablet Take 1 tablet (25 mg total) by mouth 3 (three) times daily. 08/25/20   Larey Dresser, MD  isosorbide mononitrate (IMDUR) 30 MG 24 hr tablet TAKE 1 TABLET BY MOUTH EVERY DAY 07/17/21   Larey Dresser, MD  KLOR-CON M20 20 MEQ tablet TAKE 1 TABLET BY MOUTH EVERY DAY 11/30/21   Larey Dresser, MD  rivaroxaban (XARELTO) 20 MG TABS tablet Take 1 tablet (20 mg total) by mouth daily with supper. 08/03/21   Larey Dresser, MD  rosuvastatin (CRESTOR) 20 MG tablet Take 1 tablet (20 mg total) by mouth daily. 10/03/20   Brimage, Ronnette Juniper, DO  sacubitril-valsartan  (ENTRESTO) 24-26 MG Take 1 tablet by mouth 2 (two) times daily. 05/14/21   Larey Dresser, MD  spironolactone (ALDACTONE) 25 MG tablet Take 1 tablet (25 mg total) by mouth daily. 10/30/20   Larey Dresser, MD      Allergies    Hydrocodone and Shrimp [shellfish allergy]    Review of Systems   Review of Systems  Constitutional:  Negative for activity change, appetite change and fever.  HENT:  Positive for dental problem. Negative for sinus pain.   Respiratory:  Negative for cough, chest tightness and shortness of breath.   Cardiovascular:  Negative for chest pain.  Gastrointestinal:  Negative for abdominal pain, nausea and vomiting.  Genitourinary:  Negative for dysuria and hematuria.  Musculoskeletal:  Negative for arthralgias and myalgias.  Skin:  Negative for rash.  Neurological:  Negative for weakness and headaches.   all other systems are negative except as noted in the HPI and PMH.    Physical Exam Updated Vital Signs BP (!) 152/116 (BP Location: Right Arm)   Pulse 92   Temp (!) 97.4 F (36.3 C) (Oral)   Resp (!) 21   SpO2 97%  Physical Exam Vitals and nursing note reviewed.  Constitutional:      General: He is not in acute distress.    Appearance: He is well-developed.  HENT:  Head: Normocephalic and atraumatic.     Mouth/Throat:     Pharynx: No oropharyngeal exudate.     Comments: Mostly edentulous on upper jaw.  There is a left upper incisor remaining which is tender to palpation.  No no fluctuance or abscess.  Floor mouth is soft Controlling secretions. Eyes:     Conjunctiva/sclera: Conjunctivae normal.     Pupils: Pupils are equal, round, and reactive to light.  Neck:     Comments: No meningismus. Cardiovascular:     Rate and Rhythm: Normal rate and regular rhythm.     Heart sounds: Normal heart sounds. No murmur heard. Pulmonary:     Effort: Pulmonary effort is normal. No respiratory distress.     Breath sounds: Normal breath sounds.  Chest:     Chest  wall: No tenderness.  Abdominal:     Palpations: Abdomen is soft.     Tenderness: There is no abdominal tenderness. There is no guarding or rebound.  Musculoskeletal:        General: No tenderness. Normal range of motion.     Cervical back: Normal range of motion and neck supple.  Skin:    General: Skin is warm.  Neurological:     General: No focal deficit present.     Mental Status: He is alert and oriented to person, place, and time. Mental status is at baseline.     Cranial Nerves: No cranial nerve deficit.     Motor: No abnormal muscle tone.     Coordination: Coordination normal.     Comments: No ataxia on finger to nose bilaterally. No pronator drift. 5/5 strength throughout. CN 2-12 intact.Equal grip strength. Sensation intact.   Psychiatric:        Behavior: Behavior normal.     ED Results / Procedures / Treatments   Labs (all labs ordered are listed, but only abnormal results are displayed) Labs Reviewed - No data to display  EKG None  Radiology No results found.  Procedures Procedures    Medications Ordered in ED Medications  penicillin v potassium (VEETID) tablet 500 mg (has no administration in time range)  oxyCODONE (Oxy IR/ROXICODONE) immediate release tablet 5 mg (has no administration in time range)    ED Course/ Medical Decision Making/ A&P                             Medical Decision Making Amount and/or Complexity of Data Reviewed Labs: ordered. Decision-making details documented in ED Course. Radiology: ordered and independent interpretation performed. Decision-making details documented in ED Course. ECG/medicine tests: ordered and independent interpretation performed. Decision-making details documented in ED Course.  Risk Prescription drug management.   Dental pain.  No evidence of Ludwig angina or abscess.  Discussed with patient he should see dentist to have tooth pulled.  Will treat pain and prescribe antibiotics.  Blood pressure  elevated.  Patient did not take his blood pressure medication today.  Discussed he should do this when he gets home.  Follow-up with his dentist.  Return to the ED with new or worsening symptoms       Final Clinical Impression(s) / ED Diagnoses Final diagnoses:  Pain, dental    Rx / DC Orders ED Discharge Orders     None         Adanya Sosinski, Annie Main, MD 05/23/22 [phone]

## 2022-05-23 NOTE — ED Notes (Signed)
Primary RN informed of pts arrival to room.

## 2022-05-23 NOTE — ED Notes (Signed)
Assumed care of patient.

## 2022-05-23 NOTE — Discharge Instructions (Signed)
Take the medication as prescribed and follow-up with your dentist.  Return to the ED with difficulty breathing, difficulty swallowing or other concerns.

## 2022-05-23 NOTE — ED Triage Notes (Signed)
Pt c/o left upper dental pain that started tonight. Endorses some relief with tylenol.

## 2022-05-31 ENCOUNTER — Telehealth: Payer: Self-pay

## 2022-05-31 NOTE — Telephone Encounter (Signed)
        Patient  visited [facility] on 05/23/2022  for dental pain.   Telephone encounter attempt :  1st  A HIPAA compliant voice message was left requesting a return call.  Instructed patient to call back at [phone].   [place] Resource Care Guide   [email or web address]  ?? [id number]   Website: [email or web address]  [email or web address]

## 2022-06-01 ENCOUNTER — Telehealth: Payer: Self-pay

## 2022-06-01 NOTE — Telephone Encounter (Signed)
     Patient  visit on 05/23/2022  at [facility] was for dental pain.  Have you been able to follow up with your primary care physician? Patient has followed up with his dentist.  The patient was or was not able to obtain any needed medicine or equipment. Patient was able to obtain medication.  Are there diet recommendations that you are having difficulty following? No  Patient expresses understanding of discharge instructions and education provided has no other needs at this time. Yes   [place] Resource Care Guide   [email or web address]  ?? [id number]   Website: [email or web address]  [email or web address]

## 2022-08-06 ENCOUNTER — Other Ambulatory Visit (HOSPITAL_COMMUNITY): Payer: Self-pay | Admitting: Cardiology

## 2022-08-09 ENCOUNTER — Other Ambulatory Visit (HOSPITAL_COMMUNITY): Payer: Self-pay

## 2022-08-09 ENCOUNTER — Telehealth (HOSPITAL_COMMUNITY): Payer: Self-pay | Admitting: Cardiology

## 2022-08-09 MED ORDER — RIVAROXABAN 20 MG PO TABS: 20.0000 mg | ORAL_TABLET | Freq: Every day | ORAL | 0 refills | Status: DC

## 2022-08-09 MED ORDER — ENTRESTO 24-26 MG PO TABS
1.0000 | ORAL_TABLET | Freq: Two times a day (BID) | ORAL | 0 refills | Status: DC
Start: 2022-08-09 — End: 2022-11-11

## 2022-08-09 NOTE — Telephone Encounter (Signed)
Pt called regarding medication,  forward  call to triage vm, please advise

## 2022-08-20 ENCOUNTER — Other Ambulatory Visit (HOSPITAL_COMMUNITY): Payer: Self-pay | Admitting: Cardiology

## 2022-08-20 DIAGNOSIS — E1165 Type 2 diabetes mellitus with hyperglycemia: Secondary | ICD-10-CM

## 2022-08-24 ENCOUNTER — Telehealth (HOSPITAL_COMMUNITY): Payer: Self-pay

## 2022-08-24 NOTE — Telephone Encounter (Signed)
Called patient's sister Annice Pih and left a voice message to confirm/remind patient of their appointment at the [facility] on 08/25/22.

## 2022-08-25 ENCOUNTER — Inpatient Hospital Stay (HOSPITAL_COMMUNITY)
Admission: RE | Admit: 2022-08-25 | Discharge: 2022-08-25 | Disposition: A | Discharge status: Home / Self Care | Payer: 59 | Source: Ambulatory Visit

## 2022-08-27 ENCOUNTER — Other Ambulatory Visit (HOSPITAL_COMMUNITY): Payer: Self-pay

## 2022-08-27 ENCOUNTER — Telehealth (HOSPITAL_COMMUNITY): Payer: Self-pay

## 2022-08-27 MED ORDER — FUROSEMIDE 20 MG PO TABS: 20.0000 mg | ORAL_TABLET | ORAL | 3 refills | Status: DC

## 2022-08-27 NOTE — Telephone Encounter (Signed)
Patient called and has run out of Furosemide. We rescheduled his missed appt for June 12th. I sent in refill.  He also stated he has not felt right, like his heart is beating out of rhythm. He does not have a BP cuff or any devices at home to check this information. He denied shortness of breath, swelling, or dizziness. No chest pain noted. He is going to try and get to the pharmacy and see if he can get a BP and HR.  Please advise of any changes.  He is not taking Cleda Daub, which is listed on his med list.  But all other medications he stated he is taking.

## 2022-08-27 NOTE — Telephone Encounter (Signed)
Attempted to reach patient no answer °

## 2022-08-31 NOTE — Telephone Encounter (Signed)
LMOM

## 2022-09-14 ENCOUNTER — Telehealth (HOSPITAL_COMMUNITY): Payer: Self-pay

## 2022-09-14 NOTE — Telephone Encounter (Signed)
Called and was unable to leave a voice message  to confirm/remind patient of their appointment at the [facility] on 09/15/22.

## 2022-09-15 ENCOUNTER — Encounter (HOSPITAL_COMMUNITY): Payer: 59

## 2022-11-11 ENCOUNTER — Other Ambulatory Visit (HOSPITAL_COMMUNITY): Payer: Self-pay

## 2022-11-11 ENCOUNTER — Other Ambulatory Visit: Payer: Self-pay

## 2022-11-11 ENCOUNTER — Encounter (HOSPITAL_COMMUNITY): Payer: Self-pay

## 2022-11-11 MED ORDER — ENTRESTO 24-26 MG PO TABS
1.0000 | ORAL_TABLET | Freq: Two times a day (BID) | ORAL | 1 refills | Status: DC
  Filled 2022-11-11: qty 60, 30d supply, fill #0

## 2022-11-11 MED ORDER — FUROSEMIDE 20 MG PO TABS
20.0000 mg | ORAL_TABLET | ORAL | 3 refills | Status: DC
Start: 2022-11-11 — End: 2023-02-28
  Filled 2022-11-11 (×2): qty 45, 90d supply, fill #0

## 2022-11-11 NOTE — Progress Notes (Unsigned)
Medication Samples have been provided to the patient.  Drug name: Sherryll Burger       Strength: 24/26 mg        Qty: 2  LOT: [id number]  Exp.Date: 11/2023  Dosing instructions: Take 1 tablet Twice daily   The patient has been instructed regarding the correct time, dose, and frequency of taking this medication, including desired effects and most common side effects.   Adynn Tensley Kambree Krauss 1:16 PM 11/11/2022

## 2022-11-17 ENCOUNTER — Other Ambulatory Visit (HOSPITAL_COMMUNITY): Payer: Self-pay | Admitting: Cardiology

## 2022-11-17 ENCOUNTER — Other Ambulatory Visit (HOSPITAL_COMMUNITY): Payer: Self-pay | Admitting: *Deleted

## 2022-11-28 ENCOUNTER — Other Ambulatory Visit (HOSPITAL_COMMUNITY): Payer: Self-pay | Admitting: Cardiology

## 2022-12-09 ENCOUNTER — Telehealth (HOSPITAL_COMMUNITY): Payer: Self-pay

## 2022-12-09 NOTE — Telephone Encounter (Signed)
Called patient and was unable to leave a voice message to confirm/remind patient of their appointment at the [facility] on 12/10/22.

## 2022-12-10 ENCOUNTER — Encounter (HOSPITAL_COMMUNITY): Payer: 59

## 2023-02-13 ENCOUNTER — Other Ambulatory Visit (HOSPITAL_COMMUNITY): Payer: Self-pay | Admitting: Cardiology

## 2023-02-21 ENCOUNTER — Telehealth (HOSPITAL_COMMUNITY): Payer: Self-pay | Admitting: Licensed Clinical Social Worker

## 2023-02-21 NOTE — Telephone Encounter (Signed)
H&V Care Navigation CSW Progress Note  Clinical Social Worker received call back from pt.  Explained benefit for transport through [facility]- pt agreeable to having ride set through this option.  CSW assisted in calling to arrange transport  Pick up from home: 9:13am Pick up from clinic: 11am Ride ID: [id number], [id number]   SDOH Screenings   Depression (PHQ2-9): Low Risk  (10/03/2020)  Tobacco Use: Low Risk  (09/14/2021)    Burna Sis, LCSW Clinical Social Worker [facility] Desk#: [phone] Cell#: [phone]

## 2023-02-21 NOTE — Telephone Encounter (Signed)
CSW attempted to call pt to discuss transportation to clinic appt on Friday but unable to reach- left VM and text message requesting return call  Burna Sis, LCSW Clinical Social Worker [facility] Desk#: [phone] Cell#: [phone]

## 2023-02-24 ENCOUNTER — Other Ambulatory Visit (HOSPITAL_COMMUNITY): Payer: Self-pay

## 2023-02-24 NOTE — Telephone Encounter (Signed)
Called to confirm/remind patient of their appointment at the [facility] on 02/25/23.   Patient reminded to bring all medications and/or complete list.  Confirmed patient has transportation. Gave directions, instructed to utilize valet parking.  Confirmed appointment prior to ending call.

## 2023-02-24 NOTE — Progress Notes (Signed)
Date:  02/25/2023   ID:  Kenneth Robertson, DOB November 03, 1980, MRN [id number]    Provider location: [address], [place] Type of Visit: Established patient  PCP:  Levin Erp, MD  Primary HF: Dr. [name]   History of Present Illness: Kenneth Robertson is a 42 y.o. male  with history of HTN and nonischemic cardiomyopathy who presents for followup of CHF.  He was admitted in 12/17 after 1 month of worsening dyspnea.  He was volume overloaded on exam with EF 10-15% on echo.  He was diuresed and RHC/LHC done.  This showed no CAD. Cardiac output was low.  Cardiac MRI in 4/18 showed severe LV dilation, EF 24%, prominent trabeculation (possible noncompaction), RV insertion LGE (nonspecific).     Echo in 6/20 showed that LV was severely dilated with EF 25-30%.  CPX done in 7/19 surprisingly did not show a significant CHF limitation.  He saw Dr Graciela Husbands and ICD was recommended but he deferred.   Echo in 4/21 showed EF 35-40%, diffuse hypokinesis, normal RV.  Echo 5/23 showed EF 30-35%, moderate LV dilation, mildly decreased RV systolic function.    Follow up 5/23, had not been in office in about a year. He stopped taking [place]. PCP stopped warfarin as he was noncompliant with INR checks. NYHA II, not volume overloaded. [name] restarted. Xarelto started.  Last seen 09/2021.  Today he returns for HF follow up. Overall feeling fine. No SOB with activity. Unemployed, plays piano at a church. Feels palpitations when he eats poorly. Denies abnormal bleeding, CP, dizziness, edema, or PND/Orthopnea. Appetite ok. No fever or chills. He does not weigh at home. Out of Coreg and Imdur, unclear when and why he stopped spiro. No tobacco/ETOH, drugs. Has cancelled follow ups due to depression. Looking for new PCP  ECG (personally reviewed): NSR 78 bpm  ReDs: 43%  Labs (7/18): K 4.3, creatinine 1.4 Labs (2/19): K 3.9, creatinine 1.43, digoxin < 0.2 Labs (5/19): K 4, creatinine 1.21, hgb 15.7,  digoxin 0.5 Labs (7/19): HIV negative, digoxin 0.3 Labs (8/19): K 4, creatinine 1.3 Labs (9/19): K 4.1, creatinine 1.33 Labs (1/20): HIV negative, K 3.8, creatinine 1.26, digoxin 0.4 Labs (5/20): hgb 15.7, K 4.2, creatinine 1.22, digoxin 0.7 Labs (9/20): K 4.3, creatinine 1.25, HIV negative, digoxin 0.8 Labs (1/21): K 4.1, creatinine 1.27, digoxin 0.6 Labs (3/22): LDL 158, K 4.3, creatinine 1.2 Labs (4/22): K 4, creatinine 1.2 Labs (9/22): K 4, creatinine 1.2 Labs (5/23): K 4.2, creatinine 1.2, hgb 15.5   PMH: 1. HTN 2. Depression 3. Chronic systolic CHF: Nonischemic cardiomyopathy, diagnosed in 12/17.  - Echo (12/17) with EF 10-15%, LV thrombus, moderate MR, PASP 47 mmHg, moderately dilated and dysfunctional RV.  - Echo (7/18) with EF 15%, moderate MR. PASP 40 mm Hg.  - LHC/RHC (12/17): No CAD; mean RA 8, PA 41/22, mean PCWP 20, CI 1.7.  - HIV, SPEP, TSH negative.  - Cardiac MRI in 4/18 showed severe LV dilation, EF 24%, prominent trabeculation (possible noncompaction), RV insertion LGE (nonspecific), no LV thrombus.   - Echo (7/18): EF 15%, severe LV dilation with diffuse HK, grade 3 diastolic dysfunction, mild-moderate MR, PASP 40 mmHg.  - Echo (5/19): EF 25-30%, severe LV dilation with diffuse hypokinesis, mild MR, mild RV dilation with normal systolic function.  - CPX (7/19): RER 1.08, peak VO2 30.2, VE/VCO2 slope 31 => no obvious cardiac limitation.  - Echo (6/20): EF 25-30%, severe LV dilation with diffuse hypokinesis,  mild MR, normal RV size and systolic function.  - Echo (4/21): EF 35-40%, diffuse hypokinesis, normal RV size and systolic function.  - Echo (4/23): EF 30-35%, moderate LV dilation, mildly decreased RV systolic function.  4. LV thrombus: On warfarin.  5. Sleep study with no OSA.  6. Type II diabetes 7. Hyperlipidemia  Past Surgical History:  Procedure Laterality Date   CARDIAC CATHETERIZATION N/A 04/02/2016   Procedure: Right/Left Heart Cath and Coronary  Angiography;  Surgeon: Laurey Morale, MD;  Location: [facility] INVASIVE CV LAB;  Service: Cardiovascular;  Laterality: N/A;   EYE SURGERY     Left    TOOTH EXTRACTION     Current Outpatient Medications  Medication Sig Dispense Refill   acetaminophen (TYLENOL) 500 MG tablet Take 1 tablet (500 mg total) by mouth every 6 (six) hours as needed. 30 tablet 0   digoxin (LANOXIN) 0.125 MG tablet TAKE 1 TABLET BY MOUTH EVERY DAY 90 tablet 3   furosemide (LASIX) 20 MG tablet Take 1 tablet (20 mg) by mouth every other day. 45 tablet 3   hydrALAZINE (APRESOLINE) 25 MG tablet Take 1 tablet (25 mg total) by mouth 3 (three) times daily. 270 tablet 3   potassium chloride SA (KLOR-CON M20) 20 MEQ tablet Take 1 tablet (20 mEq total) by mouth daily. NEEDS FOLLOW UP APPOINTMENT FOR MORE REFILLS 90 tablet 0   rivaroxaban (XARELTO) 20 MG TABS tablet TAKE 1 TABLET BY MOUTH DAILY WITH SUPPER. 90 tablet 0   sacubitril-valsartan (ENTRESTO) 24-26 MG Take 1 tablet by mouth 2 times daily. Patient needs home delivery 60 tablet 1   No current facility-administered medications for this encounter.   Allergies:   Hydrocodone and Shrimp [shellfish allergy]   Social History:  The patient  reports that he has never smoked. He has never used smokeless tobacco. He reports that he does not drink alcohol and does not use drugs.   Family History:  The patient's family history includes Cancer in his father; Heart failure in his mother.   ROS:  Please see the history of present illness.   All other systems are personally reviewed and negative.   Wt Readings from Last 3 Encounters:  02/25/23 117.2 kg (258 lb 6.4 oz)  09/14/21 113.9 kg (251 lb)  08/03/21 114.9 kg (253 lb 3.2 oz)   BP 128/80   Pulse 82   Wt 117.2 kg (258 lb 6.4 oz)   SpO2 97%   BMI 37.08 kg/m   Physical Exam:   General:  NAD. No resp difficulty HEENT: Normal Neck: Supple. No JVD. Carotids 2+ bilat; no bruits. No lymphadenopathy or thryomegaly appreciated. Cor:  PMI nondisplaced. Regular rate & rhythm. No rubs, gallops or murmurs. Lungs: Clear Abdomen: Soft, nontender, nondistended. No hepatosplenomegaly. No bruits or masses. Good bowel sounds. Extremities: No cyanosis, clubbing, rash, edema Neuro: Alert & oriented x 3, cranial nerves grossly intact. Moves all 4 extremities w/o difficulty. Affect pleasant.  Assessment & Plan: 1. Chronic systolic CHF: Nonischemic cardiomyopathy.  EF 10-15% by echo in 12/17.  No coronary disease and low output on cath.  HIV negative, SPEP negative.  No family history of early cardiomyopathy (mother with CHF around 51).  No ETOH/drugs. Possible viral myocarditis.  Cardiac MRI in 4/18 with severe LV dilation, EF 24%, RV insertion site LGE (nonspecific), cannot rule out noncompaction.  Echo in 5/19 showed EF 25-30% with severe LV dilation. CPX in 7/19 was quite good with minimal cardiac limitation.  Echo in 6/20 showed severe  LV dilation and EF 25-30%, normal RV systolic function.  Echo in 4/21 showed EF improved to 35-40%.  Echo (5/23) showed  EF 30-35%, moderate LV dilation, mildly decreased RV systolic function.  NYHA class !-II, not volume overloaded on exam. ReDs elevated at 43%, discordant with exam findings.. - Restart spironolactone 25 mg daily. BMET/BNP today, repeat BMET in 1 week - Restart Coreg 12.5 mg bid - He did not tolerate Farxiga (nausea and fatigue). He is willing to re-trial. Will hold off until next visit. - Continue Entresto 24/26 bid (he did not tolerate 49/51).  BMET today.  - Continue digoxin 0.125 mg daily. Check digoxin level today. - Continue current hydralazine 25 mg tid. - Continue Lasix 20 mg qod. - EF is within ICD range.  He is not a CRT candidate with narrow QRS.  We discussed ICD placement today, he wants to think longer about it.  - Repeat echo when back on GDMT. 2. LV thrombus: Not seen on more recent studies, including today.  However, LV remains weak.  He was taken off warfarin by PCP  because of failure to comply with INR checks.    - Continue Xarelto 20 mg daily.  Data not as good for LV thrombus as warfarin, but with EF still low, would like him anticoagulated. CBC today. 3. Hyperlipidemia: He if off Crestor.  - Check lipids today.  Follow up in 3 weeks with APP (add SGLT2i, may be able to stop hydral if BP stable) and 3 months with Dr. [name] + echo. I asked him to call and re-establish with his PCP  Signed, Jacklynn Ganong, FNP  02/25/2023  [facility] [address] [facility] [place] [address] [phone] (office) [phone] (fax)

## 2023-02-25 ENCOUNTER — Encounter (HOSPITAL_COMMUNITY): Payer: Self-pay

## 2023-02-25 ENCOUNTER — Ambulatory Visit (HOSPITAL_COMMUNITY)
Admission: RE | Admit: 2023-02-25 | Discharge: 2023-02-25 | Disposition: A | Discharge status: Home / Self Care | Payer: 59 | Source: Ambulatory Visit | Attending: Family Medicine | Admitting: Family Medicine

## 2023-02-25 VITALS — BP 128/80 | HR 82 | Wt 258.4 lb

## 2023-02-25 DIAGNOSIS — E785 Hyperlipidemia, unspecified: Secondary | ICD-10-CM | POA: Insufficient documentation

## 2023-02-25 DIAGNOSIS — I5022 Chronic systolic (congestive) heart failure: Secondary | ICD-10-CM | POA: Insufficient documentation

## 2023-02-25 DIAGNOSIS — I513 Intracardiac thrombosis, not elsewhere classified: Secondary | ICD-10-CM

## 2023-02-25 DIAGNOSIS — I110 Hypertensive heart disease with heart failure: Secondary | ICD-10-CM | POA: Diagnosis not present

## 2023-02-25 DIAGNOSIS — I428 Other cardiomyopathies: Secondary | ICD-10-CM | POA: Diagnosis not present

## 2023-02-25 DIAGNOSIS — Z7901 Long term (current) use of anticoagulants: Secondary | ICD-10-CM | POA: Insufficient documentation

## 2023-02-25 LAB — CBC
HCT: 47 % (ref 39.0–52.0)
Hemoglobin: 16.1 g/dL (ref 13.0–17.0)
MCH: 28.8 pg (ref 26.0–34.0)
MCHC: 34.3 g/dL (ref 30.0–36.0)
MCV: 84.1 fL (ref 80.0–100.0)
Platelets: 295 10*3/uL (ref 150–400)
RBC: 5.59 MIL/uL (ref 4.22–5.81)
RDW: 13.6 % (ref 11.5–15.5)
WBC: 3.4 10*3/uL — ABNORMAL LOW (ref 4.0–10.5)
nRBC: 0 % (ref 0.0–0.2)

## 2023-02-25 LAB — DIGOXIN LEVEL: Digoxin Level: 0.2 ng/mL — ABNORMAL LOW (ref 0.8–2.0)

## 2023-02-25 LAB — COMPREHENSIVE METABOLIC PANEL
ALT: 27 U/L (ref 0–44)
AST: 21 U/L (ref 15–41)
Albumin: 3.9 g/dL (ref 3.5–5.0)
Alkaline Phosphatase: 65 U/L (ref 38–126)
Anion gap: 8 (ref 5–15)
BUN: 12 mg/dL (ref 6–20)
CO2: 27 mmol/L (ref 22–32)
Calcium: 9.3 mg/dL (ref 8.9–10.3)
Chloride: 104 mmol/L (ref 98–111)
Creatinine, Ser: 1.36 mg/dL — ABNORMAL HIGH (ref 0.61–1.24)
GFR, Estimated: >60 mL/min (ref >60)
Glucose, Bld: 147 mg/dL — ABNORMAL HIGH (ref 70–99)
Potassium: 4.2 mmol/L (ref 3.5–5.1)
Sodium: 139 mmol/L (ref 135–145)
Total Bilirubin: 0.8 mg/dL (ref <1.2)
Total Protein: 7.5 g/dL (ref 6.5–8.1)

## 2023-02-25 LAB — LIPID PANEL
Cholesterol: 206 mg/dL — ABNORMAL HIGH (ref 0–200)
HDL: 36 mg/dL — ABNORMAL LOW (ref >40)
LDL Cholesterol: 150 mg/dL — ABNORMAL HIGH (ref 0–99)
Total CHOL/HDL Ratio: 5.7 {ratio}
Triglycerides: 102 mg/dL (ref <150)
VLDL: 20 mg/dL (ref 0–40)

## 2023-02-25 LAB — BRAIN NATRIURETIC PEPTIDE: B Natriuretic Peptide: 79.9 pg/mL (ref 0.0–100.0)

## 2023-02-25 MED ORDER — SPIRONOLACTONE 25 MG PO TABS: 25.0000 mg | ORAL_TABLET | Freq: Every day | ORAL | 3 refills | Status: DC

## 2023-02-25 MED ORDER — CARVEDILOL 12.5 MG PO TABS: 12.5000 mg | ORAL_TABLET | Freq: Two times a day (BID) | ORAL | 3 refills | Status: DC

## 2023-02-25 NOTE — Patient Instructions (Addendum)
Labs done today. We will contact you only if your labs are abnormal.  RESTART Spironolactone 25mg  (1 tablet) by mouth daily.   RESTART Carvedilol 12.5mg  (1 tablet) by mouth 2 times today.  No other medication changes were made. Please continue all current medications as prescribed.   Please contact your primary care physicians office at [phone]   For transportation please make sure you call medicaid 3 days in advance.   Your physician recommends that you schedule a follow-up appointment in: 1 week for a lab only appointment, 3 weeks with our NP/PA Clinic and in 3 months with Dr. [name] with an echo prior to your appointment. Please contact our office in December to schedule a February 2025 appointment.   Your physician has requested that you have an echocardiogram. Echocardiography is a painless test that uses sound waves to create images of your heart. It provides your doctor with information about the size and shape of your heart and how well your heart's chambers and valves are working. This procedure takes approximately one hour. There are no restrictions for this procedure. Please do NOT wear cologne, perfume, aftershave, or lotions (deodorant is allowed). Please arrive 15 minutes prior to your appointment time.  Please note: We ask at that you not bring children with you during ultrasound (echo/ vascular) testing. Due to room size and safety concerns, children are not allowed in the ultrasound rooms during exams. Our front office staff cannot provide observation of children in our lobby area while testing is being conducted. An adult accompanying a patient to their appointment will only be allowed in the ultrasound room at the discretion of the ultrasound technician under special circumstances. We apologize for any inconvenience.  If you have any questions or concerns before your next appointment please send [place] a message through [place] or call our office at [phone].    TO LEAVE  A MESSAGE FOR THE NURSE SELECT OPTION 2, PLEASE LEAVE A MESSAGE INCLUDING: YOUR NAME DATE OF BIRTH CALL BACK NUMBER REASON FOR CALL**this is important as we prioritize the call backs  YOU WILL RECEIVE A CALL BACK THE SAME DAY AS LONG AS YOU CALL BEFORE 4:00 PM   Do the following things EVERYDAY: Weigh yourself in the morning before breakfast. Write it down and keep it in a log. Take your medicines as prescribed Eat low salt foods--Limit salt (sodium) to 2000 mg per day.  Stay as active as you can everyday Limit all fluids for the day to less than 2 liters   At the [facility], you and your health needs are our priority. As part of our continuing mission to provide you with exceptional heart care, we have created designated Provider Care Teams. These Care Teams include your primary Cardiologist (physician) and Advanced Practice Providers (APPs- Physician Assistants and Nurse Practitioners) who all work together to provide you with the care you need, when you need it.   You may see any of the following providers on your designated Care Team at your next follow up: Dr Arvilla Meres Dr Marca Ancona Dr. Marcos Eke, NP Robbie Lis, [place] [facility] [place], [place] Brynda Peon, NP Karle Plumber, PharmD   Please be sure to bring in all your medications bottles to every appointment.    Thank you for choosing [facility]

## 2023-02-25 NOTE — Progress Notes (Signed)
ReDS Vest / Clip - 02/25/23 1000       ReDS Vest / Clip   Station Marker D    Ruler Value 34.5    ReDS Value Range High volume overload    ReDS Actual Value 43

## 2023-02-28 ENCOUNTER — Telehealth (HOSPITAL_COMMUNITY): Payer: Self-pay

## 2023-02-28 DIAGNOSIS — I5022 Chronic systolic (congestive) heart failure: Secondary | ICD-10-CM

## 2023-02-28 MED ORDER — ROSUVASTATIN CALCIUM 20 MG PO TABS: 20.0000 mg | ORAL_TABLET | Freq: Every day | ORAL | 3 refills | Status: DC

## 2023-02-28 MED ORDER — FUROSEMIDE 20 MG PO TABS: ORAL_TABLET | ORAL | 3 refills | Status: DC

## 2023-02-28 NOTE — Telephone Encounter (Signed)
Patient's lab orders placed appointment scheduled. In addition patient's lasix and Crestor medications has been sent to his pharmacy and updated in his chart. Pt aware, agreeable, and verbalized understanding

## 2023-02-28 NOTE — Telephone Encounter (Signed)
-----   Message from Jacklynn Ganong sent at 02/25/2023  1:28 PM EST ----- Renal function up a bit.  Change Lasix to 20 mg PRN  LDL elevated. With DM2, would recommend restarting his statin. Restart Crestor 20 mg daily. Repeat LFTs and lipids in 8 weeks

## 2023-03-08 ENCOUNTER — Ambulatory Visit (HOSPITAL_COMMUNITY)
Admission: RE | Admit: 2023-03-08 | Discharge: 2023-03-08 | Disposition: A | Discharge status: Home / Self Care | Payer: 59 | Source: Ambulatory Visit | Attending: Cardiology | Admitting: Cardiology

## 2023-03-08 DIAGNOSIS — I5022 Chronic systolic (congestive) heart failure: Secondary | ICD-10-CM | POA: Diagnosis not present

## 2023-03-08 LAB — HEPATIC FUNCTION PANEL
ALT: 27 U/L (ref 0–44)
AST: 24 U/L (ref 15–41)
Albumin: 3.9 g/dL (ref 3.5–5.0)
Alkaline Phosphatase: 83 U/L (ref 38–126)
Bilirubin, Direct: 0.2 mg/dL (ref 0.0–0.2)
Indirect Bilirubin: 0.4 mg/dL (ref 0.3–0.9)
Total Bilirubin: 0.6 mg/dL (ref <1.2)
Total Protein: 7.5 g/dL (ref 6.5–8.1)

## 2023-03-08 LAB — LIPID PANEL
Cholesterol: 177 mg/dL (ref 0–200)
HDL: 37 mg/dL — ABNORMAL LOW (ref >40)
LDL Cholesterol: 108 mg/dL — ABNORMAL HIGH (ref 0–99)
Total CHOL/HDL Ratio: 4.8 {ratio}
Triglycerides: 159 mg/dL — ABNORMAL HIGH (ref <150)
VLDL: 32 mg/dL (ref 0–40)

## 2023-03-23 NOTE — Progress Notes (Signed)
Date:  03/25/2023   ID:  [name], DOB May 28, 1980, MRN [id number]    Provider location: [address] [place] Type of Visit: Established patient  PCP:  Levin Erp, MD  Primary HF: Dr. [name]  CC: HF follow up   HPI: STEPHANOS TIET is a 42 y.o. male  with history of HTN and nonischemic cardiomyopathy who presents for followup of CHF.  He was admitted in 12/17 after 1 month of worsening dyspnea.  He was volume overloaded on exam with EF 10-15% on echo.  He was diuresed and RHC/LHC done.  This showed no CAD. Cardiac output was low.  Cardiac MRI in 4/18 showed severe LV dilation, EF 24%, prominent trabeculation (possible noncompaction), RV insertion LGE (nonspecific).     Echo in 6/20 showed that LV was severely dilated with EF 25-30%.  CPX done in 7/19 surprisingly did not show a significant CHF limitation.  He saw Dr Graciela Husbands and ICD was recommended but he deferred.   Echo in 4/21 showed EF 35-40%, diffuse hypokinesis, normal RV.  Echo 5/23 showed EF 30-35%, moderate LV dilation, mildly decreased RV systolic function.    Follow up 5/23, had not been in office in about a year. He stopped taking [place]. PCP stopped warfarin as he was noncompliant with INR checks. NYHA II, not volume overloaded. [name] restarted. Xarelto started.  Last seen 09/2021.  Follow up 11/24, had not been seen in > 1 year. Off several meds. NYHA I-II and volume OK. Meds restarted.  Today he returns for HF follow up. Overall feeling fine. He is not SOB with activity. Denies palpitations, CP, dizziness, edema, or PND/Orthopnea. Appetite ok. No fever or chills. Not weighing at home.Taking all medications. Unemployed, but plays piano at church. No ETOH/drugs/tobacco use.  ECG (personally reviewed): none ordered today  ReDs: 43%  Labs (2/19): K 3.9, creatinine 1.43, digoxin < 0.2 Labs (5/19): K 4, creatinine 1.21, hgb 15.7, digoxin 0.5 Labs (7/19): HIV negative, digoxin 0.3 Labs (8/19): K  4, creatinine 1.3 Labs (9/19): K 4.1, creatinine 1.33 Labs (1/20): HIV negative, K 3.8, creatinine 1.26, digoxin 0.4 Labs (5/20): hgb 15.7, K 4.2, creatinine 1.22, digoxin 0.7 Labs (9/20): K 4.3, creatinine 1.25, HIV negative, digoxin 0.8 Labs (1/21): K 4.1, creatinine 1.27, digoxin 0.6 Labs (3/22): LDL 158, K 4.3, creatinine 1.2 Labs (4/22): K 4, creatinine 1.2 Labs (9/22): K 4, creatinine 1.2 Labs (5/23): K 4.2, creatinine 1.2, hgb 15.5 Labs (11/24): K 4.2, creatinine 1.36, LDL 108   PMH: 1. HTN 2. Depression 3. Chronic systolic CHF: Nonischemic cardiomyopathy, diagnosed in 12/17.  - Echo (12/17) with EF 10-15%, LV thrombus, moderate MR, PASP 47 mmHg, moderately dilated and dysfunctional RV.  - Echo (7/18) with EF 15%, moderate MR. PASP 40 mm Hg.  - LHC/RHC (12/17): No CAD; mean RA 8, PA 41/22, mean PCWP 20, CI 1.7.  - HIV, SPEP, TSH negative.  - Cardiac MRI in 4/18 showed severe LV dilation, EF 24%, prominent trabeculation (possible noncompaction), RV insertion LGE (nonspecific), no LV thrombus.   - Echo (7/18): EF 15%, severe LV dilation with diffuse HK, grade 3 diastolic dysfunction, mild-moderate MR, PASP 40 mmHg.  - Echo (5/19): EF 25-30%, severe LV dilation with diffuse hypokinesis, mild MR, mild RV dilation with normal systolic function.  - CPX (7/19): RER 1.08, peak VO2 30.2, VE/VCO2 slope 31 => no obvious cardiac limitation.  - Echo (6/20): EF 25-30%, severe LV dilation with diffuse hypokinesis, mild MR,  normal RV size and systolic function.  - Echo (4/21): EF 35-40%, diffuse hypokinesis, normal RV size and systolic function.  - Echo (4/23): EF 30-35%, moderate LV dilation, mildly decreased RV systolic function.  4. LV thrombus: On warfarin.  5. Sleep study with no OSA.  6. Type II diabetes 7. Hyperlipidemia  Past Surgical History:  Procedure Laterality Date   CARDIAC CATHETERIZATION N/A 04/02/2016   Procedure: Right/Left Heart Cath and Coronary Angiography;  Surgeon:  Laurey Morale, MD;  Location: [facility] INVASIVE CV LAB;  Service: Cardiovascular;  Laterality: N/A;   EYE SURGERY     Left    TOOTH EXTRACTION     Current Outpatient Medications  Medication Sig Dispense Refill   acetaminophen (TYLENOL) 500 MG tablet Take 1 tablet (500 mg total) by mouth every 6 (six) hours as needed. 30 tablet 0   carvedilol (COREG) 12.5 MG tablet Take 1 tablet (12.5 mg total) by mouth 2 (two) times daily. 180 tablet 3   digoxin (LANOXIN) 0.125 MG tablet TAKE 1 TABLET BY MOUTH EVERY DAY 90 tablet 3   furosemide (LASIX) 20 MG tablet Take 1 tablet by mouth as needed 45 tablet 3   hydrALAZINE (APRESOLINE) 25 MG tablet Take 1 tablet (25 mg total) by mouth 3 (three) times daily. 270 tablet 3   potassium chloride SA (KLOR-CON M20) 20 MEQ tablet Take 1 tablet (20 mEq total) by mouth daily. NEEDS FOLLOW UP APPOINTMENT FOR MORE REFILLS 90 tablet 0   rivaroxaban (XARELTO) 20 MG TABS tablet TAKE 1 TABLET BY MOUTH DAILY WITH SUPPER. 90 tablet 0   rosuvastatin (CRESTOR) 20 MG tablet Take 1 tablet (20 mg total) by mouth daily. 90 tablet 3   sacubitril-valsartan (ENTRESTO) 24-26 MG Take 1 tablet by mouth 2 times daily. Patient needs home delivery 60 tablet 1   spironolactone (ALDACTONE) 25 MG tablet Take 1 tablet (25 mg total) by mouth daily. 90 tablet 3   No current facility-administered medications for this encounter.   Allergies:   Hydrocodone and Shrimp [shellfish allergy]   Social History:  The patient  reports that he has never smoked. He has never used smokeless tobacco. He reports that he does not drink alcohol and does not use drugs.   Family History:  The patient's family history includes Cancer in his father; Heart failure in his mother.   ROS:  Please see the history of present illness.   All other systems are personally reviewed and negative.   Wt Readings from Last 3 Encounters:  03/25/23 119.6 kg (263 lb 9.6 oz)  02/25/23 117.2 kg (258 lb 6.4 oz)  09/14/21 113.9 kg (251  lb)   BP 108/84   Pulse 83   Wt 119.6 kg (263 lb 9.6 oz)   SpO2 98%   BMI 37.82 kg/m   Physical Exam:   General:  NAD. No resp difficulty, walked into clinic HEENT: Normal Neck: Supple. No JVD. Carotids 2+ bilat; no bruits. No lymphadenopathy or thryomegaly appreciated. Cor: PMI nondisplaced. Regular rate & rhythm. No rubs, gallops or murmurs. Lungs: Clear Abdomen: Soft, nontender, nondistended. No hepatosplenomegaly. No bruits or masses. Good bowel sounds. Extremities: No cyanosis, clubbing, rash, edema Neuro: Alert & oriented x 3, cranial nerves grossly intact. Moves all 4 extremities w/o difficulty. Affect pleasant.  Assessment & Plan: 1. Chronic systolic CHF: Nonischemic cardiomyopathy.  EF 10-15% by echo in 12/17.  No coronary disease and low output on cath.  HIV negative, SPEP negative.  No family history of early cardiomyopathy (  mother with CHF around 2).  No ETOH/drugs. Possible viral myocarditis.  Cardiac MRI in 4/18 with severe LV dilation, EF 24%, RV insertion site LGE (nonspecific), cannot rule out noncompaction.  Echo in 5/19 showed EF 25-30% with severe LV dilation. CPX in 7/19 was quite good with minimal cardiac limitation.  Echo in 6/20 showed severe LV dilation and EF 25-30%, normal RV systolic function.  Echo in 4/21 showed EF improved to 35-40%.  Echo (5/23) showed  EF 30-35%, moderate LV dilation, mildly decreased RV systolic function.  NYHA class I, not volume overloaded on exam. ReDs elevated at 43%, discordant with exam findings. - With NYHA I symptoms, stop digoxin. - BP soft today without having morning meds. Will stop hydralazine. Given Rx for BP cuff for home BP check. - Start Jardiance 10 mg daily. - Change Lasix 20 mg to PRN, change 20 KCL to PRN when taking Lasix. - Continue spironolactone 25 mg daily.  - Continue Coreg 12.5 mg bid - Continue Entresto 24/26 bid (he did not tolerate 49/51).  BMET/BNP today.  - EF is within ICD range.  He is not a CRT  candidate with narrow QRS.  We discussed ICD placement today, he wants to think longer about it.  - Repeat echo next visit, now that he is back on GDMT. 2. LV thrombus: Not seen on more recent studies, including today.  However, LV remains weak.  He was taken off warfarin by PCP because of failure to comply with INR checks.    - Continue Xarelto 20 mg daily.  Data not as good for LV thrombus as warfarin, but with EF still low, would like him anticoagulated. No bleeding issues. 3. Hyperlipidemia: he is now back on Crestor, continue.  Follow up in 2-3 months with Dr. [name] + echo. I asked him to call and re-establish with his PCP  Signed, Jacklynn Ganong, FNP  03/25/2023  [facility] [address] [facility] [place] [address] [phone] (office) [phone] (fax)

## 2023-03-24 ENCOUNTER — Telehealth (HOSPITAL_COMMUNITY): Payer: Self-pay

## 2023-03-24 NOTE — Telephone Encounter (Signed)
Called to confirm/remind patient of their appointment at the [facility] on 03/25/23.   Patient reminded to bring all medications and/or complete list.  Confirmed patient has transportation. Gave directions, instructed to utilize valet parking.  Confirmed appointment prior to ending call.

## 2023-03-25 ENCOUNTER — Ambulatory Visit (HOSPITAL_COMMUNITY)
Admission: RE | Admit: 2023-03-25 | Discharge: 2023-03-25 | Disposition: A | Discharge status: Home / Self Care | Payer: 59 | Source: Ambulatory Visit | Attending: Family Medicine | Admitting: Family Medicine

## 2023-03-25 ENCOUNTER — Other Ambulatory Visit (HOSPITAL_COMMUNITY): Payer: Self-pay

## 2023-03-25 ENCOUNTER — Encounter (HOSPITAL_COMMUNITY): Payer: Self-pay

## 2023-03-25 VITALS — BP 108/84 | HR 83 | Wt 263.6 lb

## 2023-03-25 DIAGNOSIS — Z91148 Patient's other noncompliance with medication regimen for other reason: Secondary | ICD-10-CM | POA: Diagnosis not present

## 2023-03-25 DIAGNOSIS — Z79899 Other long term (current) drug therapy: Secondary | ICD-10-CM | POA: Diagnosis not present

## 2023-03-25 DIAGNOSIS — E785 Hyperlipidemia, unspecified: Secondary | ICD-10-CM | POA: Insufficient documentation

## 2023-03-25 DIAGNOSIS — I428 Other cardiomyopathies: Secondary | ICD-10-CM | POA: Insufficient documentation

## 2023-03-25 DIAGNOSIS — I513 Intracardiac thrombosis, not elsewhere classified: Secondary | ICD-10-CM | POA: Diagnosis not present

## 2023-03-25 DIAGNOSIS — R531 Weakness: Secondary | ICD-10-CM | POA: Diagnosis not present

## 2023-03-25 DIAGNOSIS — Z7901 Long term (current) use of anticoagulants: Secondary | ICD-10-CM | POA: Insufficient documentation

## 2023-03-25 DIAGNOSIS — I110 Hypertensive heart disease with heart failure: Secondary | ICD-10-CM | POA: Diagnosis not present

## 2023-03-25 DIAGNOSIS — I5022 Chronic systolic (congestive) heart failure: Secondary | ICD-10-CM | POA: Insufficient documentation

## 2023-03-25 LAB — BASIC METABOLIC PANEL
Anion gap: 6 (ref 5–15)
BUN: 12 mg/dL (ref 6–20)
CO2: 25 mmol/L (ref 22–32)
Calcium: 9 mg/dL (ref 8.9–10.3)
Chloride: 103 mmol/L (ref 98–111)
Creatinine, Ser: 1.12 mg/dL (ref 0.61–1.24)
GFR, Estimated: >60 mL/min (ref >60)
Glucose, Bld: 193 mg/dL — ABNORMAL HIGH (ref 70–99)
Potassium: 4 mmol/L (ref 3.5–5.1)
Sodium: 134 mmol/L — ABNORMAL LOW (ref 135–145)

## 2023-03-25 LAB — BRAIN NATRIURETIC PEPTIDE: B Natriuretic Peptide: 59 pg/mL (ref 0.0–100.0)

## 2023-03-25 MED ORDER — POTASSIUM CHLORIDE CRYS ER 20 MEQ PO TBCR: 20.0000 meq | EXTENDED_RELEASE_TABLET | Freq: Every day | ORAL | 11 refills | Status: AC | PRN

## 2023-03-25 MED ORDER — FUROSEMIDE 20 MG PO TABS: 20.0000 mg | ORAL_TABLET | Freq: Every day | ORAL | 11 refills | Status: AC | PRN

## 2023-03-25 MED ORDER — EMPAGLIFLOZIN 10 MG PO TABS: 10.0000 mg | ORAL_TABLET | Freq: Every day | ORAL | 3 refills | Status: AC

## 2023-03-25 NOTE — Progress Notes (Signed)
ReDS Vest / Clip - 03/25/23 1000       ReDS Vest / Clip   Station Marker D    Ruler Value 35    ReDS Value Range High volume overload    ReDS Actual Value 43

## 2023-03-25 NOTE — Patient Instructions (Addendum)
RedsClip done today.   Labs done today. We will contact you only if your labs are abnormal.  STOP taking Hydralazine  START Jardiance 10mg  (1 tablet) by mouth daily.   DECREASE Lasix to 20mg  (1 tablet) bym outh daily as needed for swelling or a weight gain of 3 pounds or more in 24 hours or 5 pounds in 1 week.   DECREASE Potassium to (1 tablet) by mouth daily as needed (only when you take the Lasix)  No other medication changes were made. Please continue all current medications as prescribed.  Your physician recommends that you schedule a follow-up appointment in: 2 months with Dr. [name] with an echo prior to your appointment  Your physician has requested that you have an echocardiogram. Echocardiography is a painless test that uses sound waves to create images of your heart. It provides your doctor with information about the size and shape of your heart and how well your heart's chambers and valves are working. This procedure takes approximately one hour. There are no restrictions for this procedure. Please do NOT wear cologne, perfume, aftershave, or lotions (deodorant is allowed). Please arrive 15 minutes prior to your appointment time.  Please note: We ask at that you not bring children with you during ultrasound (echo/ vascular) testing. Due to room size and safety concerns, children are not allowed in the ultrasound rooms during exams. Our front office staff cannot provide observation of children in our lobby area while testing is being conducted. An adult accompanying a patient to their appointment will only be allowed in the ultrasound room at the discretion of the ultrasound technician under special circumstances. We apologize for any inconvenience.  If you have any questions or concerns before your next appointment please send [place] a message through [place] or call our office at [phone].    TO LEAVE A MESSAGE FOR THE NURSE SELECT OPTION 2, PLEASE LEAVE A MESSAGE  INCLUDING: YOUR NAME DATE OF BIRTH CALL BACK NUMBER REASON FOR CALL**this is important as we prioritize the call backs  YOU WILL RECEIVE A CALL BACK THE SAME DAY AS LONG AS YOU CALL BEFORE 4:00 PM   Do the following things EVERYDAY: Weigh yourself in the morning before breakfast. Write it down and keep it in a log. Take your medicines as prescribed Eat low salt foods--Limit salt (sodium) to 2000 mg per day.  Stay as active as you can everyday Limit all fluids for the day to less than 2 liters   At the [facility], you and your health needs are our priority. As part of our continuing mission to provide you with exceptional heart care, we have created designated Provider Care Teams. These Care Teams include your primary Cardiologist (physician) and Advanced Practice Providers (APPs- Physician Assistants and Nurse Practitioners) who all work together to provide you with the care you need, when you need it.   You may see any of the following providers on your designated Care Team at your next follow up: Dr Arvilla Meres Dr Marca Ancona Dr. Marcos Eke, NP Robbie Lis, [place] [facility] [place], [place] Brynda Peon, NP Karle Plumber, PharmD   Please be sure to bring in all your medications bottles to every appointment.    Thank you for choosing [facility]

## 2023-04-12 ENCOUNTER — Emergency Department (HOSPITAL_COMMUNITY)
Admission: EM | Admit: 2023-04-12 | Discharge: 2023-04-13 | Discharge status: Against Medical Advice | Payer: 59 | Attending: Emergency Medicine | Admitting: Emergency Medicine

## 2023-04-12 DIAGNOSIS — Z5321 Procedure and treatment not carried out due to patient leaving prior to being seen by health care provider: Secondary | ICD-10-CM | POA: Insufficient documentation

## 2023-04-12 DIAGNOSIS — K029 Dental caries, unspecified: Secondary | ICD-10-CM | POA: Diagnosis not present

## 2023-04-12 DIAGNOSIS — K0889 Other specified disorders of teeth and supporting structures: Secondary | ICD-10-CM | POA: Diagnosis present

## 2023-04-12 NOTE — ED Triage Notes (Signed)
 Pt has left upper and lower dental pain x 1 day. Pt's teeth are broken and decayed.

## 2023-04-13 ENCOUNTER — Encounter (HOSPITAL_COMMUNITY): Payer: Self-pay

## 2023-04-13 ENCOUNTER — Other Ambulatory Visit: Payer: Self-pay

## 2023-04-13 NOTE — ED Notes (Signed)
 Pt didn't want to wait anymore

## 2023-04-15 ENCOUNTER — Other Ambulatory Visit: Payer: Self-pay

## 2023-04-15 ENCOUNTER — Emergency Department (HOSPITAL_COMMUNITY)
Admission: EM | Admit: 2023-04-15 | Discharge: 2023-04-15 | Disposition: A | Discharge status: Home / Self Care | Payer: 59 | Attending: Emergency Medicine | Admitting: Emergency Medicine

## 2023-04-15 ENCOUNTER — Encounter (HOSPITAL_COMMUNITY): Payer: Self-pay

## 2023-04-15 DIAGNOSIS — K0889 Other specified disorders of teeth and supporting structures: Secondary | ICD-10-CM

## 2023-04-15 DIAGNOSIS — K0381 Cracked tooth: Secondary | ICD-10-CM | POA: Insufficient documentation

## 2023-04-15 DIAGNOSIS — Z7901 Long term (current) use of anticoagulants: Secondary | ICD-10-CM | POA: Diagnosis not present

## 2023-04-15 MED ORDER — OXYCODONE HCL 5 MG PO TABS
5.0000 mg | ORAL_TABLET | Freq: Once | ORAL | Status: AC
  Administered 2023-04-15: 5 mg via ORAL
  Filled 2023-04-15: qty 1

## 2023-04-15 MED ORDER — ONDANSETRON HCL 4 MG PO TABS
4.0000 mg | ORAL_TABLET | Freq: Once | ORAL | Status: AC
  Administered 2023-04-15: 4 mg via ORAL
  Filled 2023-04-15: qty 1

## 2023-04-15 MED ORDER — OXYCODONE HCL 5 MG PO TABS: 5.0000 mg | ORAL_TABLET | ORAL | 0 refills | Status: DC | PRN

## 2023-04-15 MED ORDER — AMOXICILLIN-POT CLAVULANATE 875-125 MG PO TABS
1.0000 | ORAL_TABLET | Freq: Once | ORAL | Status: AC
  Administered 2023-04-15: 1 via ORAL
  Filled 2023-04-15: qty 1

## 2023-04-15 MED ORDER — AMOXICILLIN-POT CLAVULANATE 875-125 MG PO TABS: 1.0000 | ORAL_TABLET | Freq: Two times a day (BID) | ORAL | 0 refills | Status: DC

## 2023-04-15 NOTE — ED Provider Notes (Signed)
 [place] EMERGENCY DEPARTMENT AT [facility] Provider Note   CSN: [id number] Arrival date & time: 04/15/23  1934     History  Chief Complaint  Patient presents with   Dental Pain    Kenneth Robertson is a 43 y.o. male.  HPI   43 year old male presents emergency department with dental pain.  He is complaining of a previous cracked tooth and now infection in the left upper jaw.  He states he came to the ER 3 days ago but left due to the wait.  Called his primary doctor who called him in amoxicillin  and a few pain pills.  He presents with worsening pain.  Denies any fevers or systemic symptoms.  He has an appointment next week with a dentist for tooth extraction.  Denies any significant facial swelling, difficulty swallowing.  Home Medications Prior to Admission medications   Medication Sig Start Date End Date Taking? Authorizing Provider  acetaminophen  (TYLENOL ) 500 MG tablet Take 1 tablet (500 mg total) by mouth every 6 (six) hours as needed. 01/02/21   Nivia Colon, PA-C  carvedilol  (COREG ) 12.5 MG tablet Take 1 tablet (12.5 mg total) by mouth 2 (two) times daily. 02/25/23   Glena Harlene HERO, FNP  digoxin  (LANOXIN ) 0.125 MG tablet TAKE 1 TABLET BY MOUTH EVERY DAY 12/01/22   Rolan Ezra RAMAN, MD  empagliflozin  (JARDIANCE ) 10 MG TABS tablet Take 1 tablet (10 mg total) by mouth daily before breakfast. 03/25/23   [place], Harlene HERO, FNP  furosemide  (LASIX ) 20 MG tablet Take 1 tablet (20 mg total) by mouth daily as needed (swelling or a weight gain of 3 pounds or more in 24 hours or 5 pounds in 1 week.). Take 1 tablet by mouth as needed 03/25/23   Glena Harlene HERO, FNP  potassium chloride  SA (KLOR-CON  M20) 20 MEQ tablet Take 1 tablet (20 mEq total) by mouth daily as needed. When taking Lasix  03/25/23   Glena Harlene HERO, FNP  rivaroxaban  (XARELTO ) 20 MG TABS tablet TAKE 1 TABLET BY MOUTH DAILY WITH SUPPER. 11/17/22   Rolan Ezra RAMAN, MD  rosuvastatin  (CRESTOR ) 20 MG tablet Take 1  tablet (20 mg total) by mouth daily. 02/28/23 05/29/23  Glena Harlene HERO, FNP  sacubitril -valsartan  (ENTRESTO ) 24-26 MG Take 1 tablet by mouth 2 times daily. Patient needs home delivery 11/11/22   Rolan Ezra RAMAN, MD  spironolactone  (ALDACTONE ) 25 MG tablet Take 1 tablet (25 mg total) by mouth daily. 02/25/23   Glena Harlene HERO, FNP      Allergies    Hydrocodone  and Shrimp [shellfish allergy]    Review of Systems   Review of Systems  Constitutional:  Negative for fever.  HENT:  Positive for dental problem. Negative for drooling, facial swelling, trouble swallowing and voice change.   Respiratory:  Negative for shortness of breath.   Cardiovascular:  Negative for chest pain.  Gastrointestinal:  Negative for nausea and vomiting.    Physical Exam Updated Vital Signs BP (!) 136/97 (BP Location: Right Arm)   Pulse 90   Temp 97.6 F (36.4 C) (Oral)   Resp 16   Ht 5' 11 (1.803 m)   Wt 117.9 kg   SpO2 99%   BMI 36.26 kg/m  Physical Exam Vitals and nursing note reviewed.  Constitutional:      General: He is not in acute distress.    Appearance: Normal appearance.  HENT:     Head: Normocephalic.     Mouth/Throat:     Mouth: Mucous  membranes are moist.     Comments: Poor dentition, appears to be the remainder of a cracked tooth with surrounding gum swelling in the left upper.  No significant facial or submandibular swelling. Cardiovascular:     Rate and Rhythm: Normal rate.  Pulmonary:     Effort: Pulmonary effort is normal. No respiratory distress.  Abdominal:     Palpations: Abdomen is soft.     Tenderness: There is no abdominal tenderness.  Skin:    General: Skin is warm.  Neurological:     Mental Status: He is alert and oriented to person, place, and time. Mental status is at baseline.  Psychiatric:        Mood and Affect: Mood normal.     ED Results / Procedures / Treatments   Labs (all labs ordered are listed, but only abnormal results are displayed) Labs  Reviewed - No data to display  EKG None  Radiology No results found.  Procedures Procedures    Medications Ordered in ED Medications  amoxicillin -clavulanate (AUGMENTIN ) 875-125 MG per tablet 1 tablet (has no administration in time range)  ondansetron  (ZOFRAN ) tablet 4 mg (has no administration in time range)  oxyCODONE  (Oxy IR/ROXICODONE ) immediate release tablet 5 mg (has no administration in time range)    ED Course/ Medical Decision Making/ A&P                                 Medical Decision Making Risk Prescription drug management.   43 year old male with upper dental infection, on day 3 of amoxicillin  prescribed by his primary doctor.  Was given a few pain pills without significant relief.  Here for worsening pain.  Denies any significant swelling, fever, systemic symptoms.  Physical exam is reassuring.  I have escalated the amoxicillin  to Augmentin  for better coverage.  He did well with a dose of oxycodone  here and I will prescribe this for the next couple days to get him through to his dental appointment.  No other concerning swelling or indication for imaging.  Patient at this time appears safe and stable for discharge and close outpatient follow up. Discharge plan and strict return to ED precautions discussed, patient verbalizes understanding and agreement.        Final Clinical Impression(s) / ED Diagnoses Final diagnoses:  None    Rx / DC Orders ED Discharge Orders     None         Bari Roxie HERO, DO 04/15/23 2201

## 2023-04-15 NOTE — ED Triage Notes (Signed)
 Pt reports dental pain x3 days and has been taking abx since Wednesday. Pt reports increase in pain and has scheduled extraction for Tuesday with dentist.

## 2023-04-15 NOTE — Discharge Instructions (Signed)
 You have been seen and discharged from the emergency department.  Please stop taking your amoxicillin .  Take the new prescription of Augmentin , this will be stronger for your dental infection.  Take new pain medicine as needed.  Do not mix this medication with alcohol or other sedating medications. Do not drive or do heavy physical activity until you know how this medication affects you.  It may cause drowsiness.  Follow-up with dentistry for further evaluation and further care. Take home medications as prescribed. If you have any worsening symptoms or further concerns for your health please return to an emergency department for further evaluation.

## 2023-04-25 ENCOUNTER — Other Ambulatory Visit (HOSPITAL_COMMUNITY): Payer: 59

## 2023-04-28 ENCOUNTER — Other Ambulatory Visit (HOSPITAL_COMMUNITY): Payer: 59

## 2023-05-11 ENCOUNTER — Telehealth (HOSPITAL_COMMUNITY): Payer: Self-pay | Admitting: Cardiology

## 2023-05-11 NOTE — Telephone Encounter (Signed)
 This patient stated that he was told by Dr. Montez Ape nurse that a letter would be here up front for him by Wednesday in regards to his handicap form for his car. Please advise

## 2023-05-12 DIAGNOSIS — I5042 Chronic combined systolic (congestive) and diastolic (congestive) heart failure: Secondary | ICD-10-CM | POA: Diagnosis not present

## 2023-05-13 NOTE — Telephone Encounter (Signed)
 [employer]m temporary handicap placard signed by Dennise Fitz, NP, pt aware and will pick up at front desk

## 2023-05-16 ENCOUNTER — Other Ambulatory Visit (HOSPITAL_COMMUNITY): Payer: Self-pay | Admitting: Cardiology

## 2023-05-30 ENCOUNTER — Telehealth (HOSPITAL_COMMUNITY): Payer: Self-pay | Admitting: Cardiology

## 2023-05-30 NOTE — Telephone Encounter (Signed)
 Called patient at [phone] to remind patient of his appointments scheduled for Tuesday 05/31/23.  Echo 10:00 AM  Dr. [name] 11:00 AM  Front office was unable to speak to patient directly so office staff member left patient a voice message on his telephone on 05/30/23.

## 2023-05-31 ENCOUNTER — Ambulatory Visit (HOSPITAL_BASED_OUTPATIENT_CLINIC_OR_DEPARTMENT_OTHER)
Admission: RE | Admit: 2023-05-31 | Discharge: 2023-05-31 | Disposition: A | Discharge status: Home / Self Care | Payer: 59 | Source: Ambulatory Visit | Attending: Cardiology | Admitting: Cardiology

## 2023-05-31 ENCOUNTER — Encounter (HOSPITAL_COMMUNITY): Payer: Self-pay | Admitting: Cardiology

## 2023-05-31 ENCOUNTER — Ambulatory Visit (HOSPITAL_COMMUNITY)
Admission: RE | Admit: 2023-05-31 | Discharge: 2023-05-31 | Disposition: A | Discharge status: Home / Self Care | Payer: 59 | Source: Ambulatory Visit | Attending: Family Medicine | Admitting: Family Medicine

## 2023-05-31 VITALS — BP 104/60 | HR 82 | Wt 261.8 lb

## 2023-05-31 DIAGNOSIS — Z7984 Long term (current) use of oral hypoglycemic drugs: Secondary | ICD-10-CM | POA: Diagnosis not present

## 2023-05-31 DIAGNOSIS — I509 Heart failure, unspecified: Secondary | ICD-10-CM | POA: Diagnosis present

## 2023-05-31 DIAGNOSIS — E1151 Type 2 diabetes mellitus with diabetic peripheral angiopathy without gangrene: Secondary | ICD-10-CM | POA: Diagnosis not present

## 2023-05-31 DIAGNOSIS — I110 Hypertensive heart disease with heart failure: Secondary | ICD-10-CM | POA: Diagnosis not present

## 2023-05-31 DIAGNOSIS — I5022 Chronic systolic (congestive) heart failure: Secondary | ICD-10-CM | POA: Insufficient documentation

## 2023-05-31 DIAGNOSIS — I428 Other cardiomyopathies: Secondary | ICD-10-CM | POA: Diagnosis not present

## 2023-05-31 DIAGNOSIS — E119 Type 2 diabetes mellitus without complications: Secondary | ICD-10-CM | POA: Diagnosis not present

## 2023-05-31 DIAGNOSIS — Z79899 Other long term (current) drug therapy: Secondary | ICD-10-CM | POA: Insufficient documentation

## 2023-05-31 DIAGNOSIS — Z006 Encounter for examination for normal comparison and control in clinical research program: Secondary | ICD-10-CM

## 2023-05-31 DIAGNOSIS — Z0001 Encounter for general adult medical examination with abnormal findings: Secondary | ICD-10-CM | POA: Diagnosis not present

## 2023-05-31 DIAGNOSIS — J069 Acute upper respiratory infection, unspecified: Secondary | ICD-10-CM | POA: Diagnosis not present

## 2023-05-31 DIAGNOSIS — E785 Hyperlipidemia, unspecified: Secondary | ICD-10-CM | POA: Insufficient documentation

## 2023-05-31 DIAGNOSIS — I70221 Atherosclerosis of native arteries of extremities with rest pain, right leg: Secondary | ICD-10-CM | POA: Diagnosis not present

## 2023-05-31 LAB — BASIC METABOLIC PANEL
Anion gap: 4 — ABNORMAL LOW (ref 5–15)
BUN: 13 mg/dL (ref 6–20)
CO2: 25 mmol/L (ref 22–32)
Calcium: 9.2 mg/dL (ref 8.9–10.3)
Chloride: 103 mmol/L (ref 98–111)
Creatinine, Ser: 1.15 mg/dL (ref 0.61–1.24)
GFR, Estimated: >60 mL/min (ref >60)
Glucose, Bld: 230 mg/dL — ABNORMAL HIGH (ref 70–99)
Potassium: 4.5 mmol/L (ref 3.5–5.1)
Sodium: 132 mmol/L — ABNORMAL LOW (ref 135–145)

## 2023-05-31 LAB — ECHOCARDIOGRAM COMPLETE
AR max vel: 1.86 cm2
AV Area VTI: 1.84 cm2
AV Area mean vel: 1.8 cm2
AV Mean grad: 3 mm[Hg]
AV Peak grad: 6.3 mm[Hg]
Ao pk vel: 1.25 m/s
Area-P 1/2: 3.99 cm2
Calc EF: 33.3 %
S' Lateral: 6.6 cm
Single Plane A2C EF: 34.1 %
Single Plane A4C EF: 29.5 %

## 2023-05-31 LAB — BRAIN NATRIURETIC PEPTIDE: B Natriuretic Peptide: 67 pg/mL (ref 0.0–100.0)

## 2023-05-31 MED ORDER — SACUBITRIL-VALSARTAN 49-51 MG PO TABS: 1.0000 | ORAL_TABLET | Freq: Two times a day (BID) | ORAL | 11 refills | Status: DC

## 2023-05-31 NOTE — Patient Instructions (Signed)
 INCREASE Entresto to 49/51 mg Twice daily  Labs done today, your results will be available in MyChart, we will contact you for abnormal readings.  Repeat blood work in 10 days.  Your physician recommends that you schedule a follow-up appointment in: 3 months.  If you have any questions or concerns before your next appointment please send [place] a message through [place] or call our office at [phone].    TO LEAVE A MESSAGE FOR THE NURSE SELECT OPTION 2, PLEASE LEAVE A MESSAGE INCLUDING: YOUR NAME DATE OF BIRTH CALL BACK NUMBER REASON FOR CALL**this is important as we prioritize the call backs  YOU WILL RECEIVE A CALL BACK THE SAME DAY AS LONG AS YOU CALL BEFORE 4:00 PM  At the [facility], you and your health needs are our priority. As part of our continuing mission to provide you with exceptional heart care, we have created designated Provider Care Teams. These Care Teams include your primary Cardiologist (physician) and Advanced Practice Providers (APPs- Physician Assistants and Nurse Practitioners) who all work together to provide you with the care you need, when you need it.   You may see any of the following providers on your designated Care Team at your next follow up: Dr Arvilla Meres Dr Marca Ancona Dr. Dorthula Nettles Dr. Clearnce Hasten Amy Filbert Schilder, NP Robbie Lis, [place] [facility] [place], [place] Brynda Peon, NP [place] Mele, NP Clarisa Kindred, NP Karle Plumber, PharmD Enos Fling, PharmD   Please be sure to bring in all your medications bottles to every appointment.    Thank you for choosing [facility]

## 2023-05-31 NOTE — Research (Signed)
 SITE: [id number]     Subject # [id number]   Subprotocol: A  Inclusion Criteria  Patients who meet all of the following criteria are eligible for enrollment as study participants:  Yes No  Age > 44 years old X   Eligible to wear Holter Study X    Exclusion Criteria  Patients who meet any of these criteria are not eligible for enrollment as study participants: Yes No  1. Receiving any mechanical (respiratory or circulatory) or renal support therapy at Screening or during Visit #1.  X  2.  Any other conditions that in the opinion of the investigators are likely to prevent compliance with the study protocol or pose a safety concern if the subject participates in the study.  X  3. Poor tolerance, namely susceptible to severe skin allergies from ECG adhesive patch application.  X   Protocol: REV H                                     Residential Zip code [address] (First 3 digits ONLY)                                             [facility] Informed Consent   Subject Name: Kenneth Robertson  Subject met inclusion and exclusion criteria.  The informed consent form, study requirements and expectations were reviewed with the subject. Subject had opportunity to read consent and questions and concerns were addressed prior to the signing of the consent form.  The subject verbalized understanding of the trial requirements.  The subject agreed to participate in the [facility] EF ACT trial and signed the informed consent at 10:44 on 31-May-2023.  The informed consent was obtained prior to performance of any protocol-specific procedures for the subject.  A copy of the signed informed consent was given to the subject and a copy was placed in the subject's medical record.   Dyanne Iha          Current Outpatient Medications:    acetaminophen (TYLENOL) 500 MG tablet, Take 1 tablet (500 mg total) by mouth every 6 (six) hours as needed., Disp: 30 tablet, Rfl: 0   carvedilol (COREG) 12.5 MG tablet, Take 1 tablet (12.5 mg  total) by mouth 2 (two) times daily., Disp: 180 tablet, Rfl: 3   digoxin (LANOXIN) 0.125 MG tablet, TAKE 1 TABLET BY MOUTH EVERY DAY, Disp: 90 tablet, Rfl: 3   empagliflozin (JARDIANCE) 10 MG TABS tablet, Take 1 tablet (10 mg total) by mouth daily before breakfast., Disp: 90 tablet, Rfl: 3   furosemide (LASIX) 20 MG tablet, Take 1 tablet (20 mg total) by mouth daily as needed (swelling or a weight gain of 3 pounds or more in 24 hours or 5 pounds in 1 week.). Take 1 tablet by mouth as needed, Disp: 30 tablet, Rfl: 11   potassium chloride SA (KLOR-CON M20) 20 MEQ tablet, Take 1 tablet (20 mEq total) by mouth daily as needed. When taking Lasix, Disp: 30 tablet, Rfl: 11   rosuvastatin (CRESTOR) 20 MG tablet, Take 1 tablet (20 mg total) by mouth daily., Disp: 90 tablet, Rfl: 3   sacubitril-valsartan (ENTRESTO) 49-51 MG, Take 1 tablet by mouth 2 (two) times daily., Disp: 60 tablet, Rfl: 11   spironolactone (ALDACTONE) 25 MG tablet, Take 1 tablet (  25 mg total) by mouth daily., Disp: 90 tablet, Rfl: 3   XARELTO 20 MG TABS tablet, TAKE 1 TABLET BY MOUTH DAILY WITH SUPPER, Disp: 90 tablet, Rfl: 0

## 2023-06-01 NOTE — Progress Notes (Signed)
 Date:  06/01/2023   ID:  Kenneth Robertson, DOB [birth year], MRN [id number]    Provider location: [address] [place] Type of Visit: Established patient  PCP:  Levin Erp, MD  Primary HF: Dr. [name]  CC: HF follow up   HPI: Kenneth Robertson is a 43 y.o. male  with history of HTN and nonischemic cardiomyopathy who presents for followup of CHF.  He was admitted in 12/17 after 1 month of worsening dyspnea.  He was volume overloaded on exam with EF 10-15% on echo.  He was diuresed and RHC/LHC done.  This showed no CAD. Cardiac output was low.  Cardiac MRI in 4/18 showed severe LV dilation, EF 24%, prominent trabeculation (possible noncompaction), RV insertion LGE (nonspecific).     Echo in 6/20 showed that LV was severely dilated with EF 25-30%.  CPX done in 7/19 surprisingly did not show a significant CHF limitation.  He saw Dr Graciela Husbands and ICD was recommended but he deferred.   Echo in 4/21 showed EF 35-40%, diffuse hypokinesis, normal RV.  Echo 5/23 showed EF 30-35%, moderate LV dilation, mildly decreased RV systolic function.    Echo was done today and reviewed, severe LV dilation with EF 25%, RV mildly decreased systolic function, IVC normal.   Today he returns for HF follow up. No dyspnea walking on flat ground or up a flight of stairs.  Weight down 3 lbs.  No orthopnea/PND.  He uses Lsaix about 3 times/week.  No chest pain.  No palpitations or lightheadedness.   ECG (personally reviewed): NSR, LVH with QRS widening (132 msec)  Labs (2/19): K 3.9, creatinine 1.43, digoxin < 0.2 Labs (5/19): K 4, creatinine 1.21, hgb 15.7, digoxin 0.5 Labs (7/19): HIV negative, digoxin 0.3 Labs (8/19): K 4, creatinine 1.3 Labs (9/19): K 4.1, creatinine 1.33 Labs (1/20): HIV negative, K 3.8, creatinine 1.26, digoxin 0.4 Labs (5/20): hgb 15.7, K 4.2, creatinine 1.22, digoxin 0.7 Labs (9/20): K 4.3, creatinine 1.25, HIV negative, digoxin 0.8 Labs (1/21): K 4.1, creatinine 1.27, digoxin  0.6 Labs (3/22): LDL 158, K 4.3, creatinine 1.2 Labs (4/22): K 4, creatinine 1.2 Labs (9/22): K 4, creatinine 1.2 Labs (5/23): K 4.2, creatinine 1.2, hgb 15.5 Labs (11/24): K 4.2, creatinine 1.36, LDL 108 Labs (12/24): K 4, creatinine 1.12    PMH: 1. HTN 2. Depression 3. Chronic systolic CHF: Nonischemic cardiomyopathy, diagnosed in 12/17.  - Echo (12/17) with EF 10-15%, LV thrombus, moderate MR, PASP 47 mmHg, moderately dilated and dysfunctional RV.  - Echo (7/18) with EF 15%, moderate MR. PASP 40 mm Hg.  - LHC/RHC (12/17): No CAD; mean RA 8, PA 41/22, mean PCWP 20, CI 1.7.  - HIV, SPEP, TSH negative.  - Cardiac MRI in 4/18 showed severe LV dilation, EF 24%, prominent trabeculation (possible noncompaction), RV insertion LGE (nonspecific), no LV thrombus.   - Echo (7/18): EF 15%, severe LV dilation with diffuse HK, grade 3 diastolic dysfunction, mild-moderate MR, PASP 40 mmHg.  - Echo (5/19): EF 25-30%, severe LV dilation with diffuse hypokinesis, mild MR, mild RV dilation with normal systolic function.  - CPX (7/19): RER 1.08, peak VO2 30.2, VE/VCO2 slope 31 => no obvious cardiac limitation.  - Echo (6/20): EF 25-30%, severe LV dilation with diffuse hypokinesis, mild MR, normal RV size and systolic function.  - Echo (4/21): EF 35-40%, diffuse hypokinesis, normal RV size and systolic function.  - Echo (4/23): EF 30-35%, moderate LV dilation, mildly decreased RV systolic  function.  - Echo (2/25): severe LV dilation with EF 25%, RV mildly decreased systolic function, IVC normal. 4. LV thrombus: On warfarin.  5. Sleep study with no OSA.  6. Type II diabetes 7. Hyperlipidemia  Past Surgical History:  Procedure Laterality Date   CARDIAC CATHETERIZATION N/A 04/02/2016   Procedure: Right/Left Heart Cath and Coronary Angiography;  Surgeon: Laurey Morale, MD;  Location: [facility] INVASIVE CV LAB;  Service: Cardiovascular;  Laterality: N/A;   EYE SURGERY     Left    TOOTH EXTRACTION     Current  Outpatient Medications  Medication Sig Dispense Refill   acetaminophen (TYLENOL) 500 MG tablet Take 1 tablet (500 mg total) by mouth every 6 (six) hours as needed. 30 tablet 0   carvedilol (COREG) 12.5 MG tablet Take 1 tablet (12.5 mg total) by mouth 2 (two) times daily. 180 tablet 3   digoxin (LANOXIN) 0.125 MG tablet TAKE 1 TABLET BY MOUTH EVERY DAY 90 tablet 3   empagliflozin (JARDIANCE) 10 MG TABS tablet Take 1 tablet (10 mg total) by mouth daily before breakfast. 90 tablet 3   furosemide (LASIX) 20 MG tablet Take 1 tablet (20 mg total) by mouth daily as needed (swelling or a weight gain of 3 pounds or more in 24 hours or 5 pounds in 1 week.). Take 1 tablet by mouth as needed 30 tablet 11   potassium chloride SA (KLOR-CON M20) 20 MEQ tablet Take 1 tablet (20 mEq total) by mouth daily as needed. When taking Lasix 30 tablet 11   rosuvastatin (CRESTOR) 20 MG tablet Take 1 tablet (20 mg total) by mouth daily. 90 tablet 3   sacubitril-valsartan (ENTRESTO) 49-51 MG Take 1 tablet by mouth 2 (two) times daily. 60 tablet 11   spironolactone (ALDACTONE) 25 MG tablet Take 1 tablet (25 mg total) by mouth daily. 90 tablet 3   XARELTO 20 MG TABS tablet TAKE 1 TABLET BY MOUTH DAILY WITH SUPPER 90 tablet 0   No current facility-administered medications for this encounter.   Allergies:   Hydrocodone and Shrimp [shellfish allergy]   Social History:  The patient  reports that he has never smoked. He has never used smokeless tobacco. He reports that he does not drink alcohol and does not use drugs.   Family History:  The patient's family history includes Cancer in his father; Heart failure in his mother.   ROS:  Please see the history of present illness.   All other systems are personally reviewed and negative.   Wt Readings from Last 3 Encounters:  05/31/23 118.8 kg (261 lb 12.8 oz)  04/15/23 117.9 kg (260 lb)  04/12/23 117.9 kg (260 lb)   BP 104/60   Pulse 82   Wt 118.8 kg (261 lb 12.8 oz)   SpO2  97%   BMI 36.51 kg/m   Physical Exam:   General: NAD Neck: No JVD, no thyromegaly or thyroid nodule.  Lungs: Clear to auscultation bilaterally with normal respiratory effort. CV: Lateral PMI.  Heart regular S1/S2, no S3/S4, no murmur.  No peripheral edema.  No carotid bruit.  Normal pedal pulses.  Abdomen: Soft, nontender, no hepatosplenomegaly, no distention.  Skin: Intact without lesions or rashes.  Neurologic: Alert and oriented x 3.  Psych: Normal affect. Extremities: No clubbing or cyanosis.  HEENT: Normal.   Assessment & Plan: 1. Chronic systolic CHF: Nonischemic cardiomyopathy.  EF 10-15% by echo in 12/17.  No coronary disease and low output on cath.  HIV negative, SPEP negative.  No family history of early cardiomyopathy (mother with CHF around 78).  No ETOH/drugs. Possible viral myocarditis.  Cardiac MRI in 4/18 with severe LV dilation, EF 24%, RV insertion site LGE (nonspecific), cannot rule out noncompaction.  Echo in 5/19 showed EF 25-30% with severe LV dilation. CPX in 7/19 was quite good with minimal cardiac limitation.  Echo in 6/20 showed severe LV dilation and EF 25-30%, normal RV systolic function.  Echo in 4/21 showed EF improved to 35-40%.  Echo (5/23) showed  EF 30-35%, moderate LV dilation, mildly decreased RV systolic function.  Echo today showed EF 25%, mild RV dysfunction. He is not volume overloaded on exam and weight is down.  NYHA class II.  - EF < 35%, I recommended ICD.  Not CRT candidate.  He wants to wait another 6 months and repeat echo.  - Continue Jardiance 10 mg daily. - Change Lasix 20 mg PRN - Continue spironolactone 25 mg daily.  - Continue Coreg 12.5 mg bid - Increase to 49/51 bid, BMET/BNP today and BMET in 10 days.  2. LV thrombus: Not seen on more recent studies, including today.  However, LV remains weak.  He was taken off warfarin by PCP because of failure to comply with INR checks.    - Continue Xarelto 20 mg daily.  3. Hyperlipidemia: he is now  back on Crestor, continue.  Followup 2 months with APP.   I spent 31 minutes reviewing data, interviewing patient, and organizing the orders/followup.    Signed, Marca Ancona, MD  06/01/2023  [facility] [address] [facility] [place] [address] [phone] (office) [phone] (fax)

## 2023-06-14 ENCOUNTER — Other Ambulatory Visit (HOSPITAL_COMMUNITY): Payer: 59

## 2023-06-20 NOTE — Addendum Note (Signed)
 Encounter addended by: Howell Rucks, RDCS on: 06/20/2023 7:38 AM  Actions taken: Imaging Exam ended

## 2023-07-13 ENCOUNTER — Telehealth (HOSPITAL_COMMUNITY): Payer: Self-pay

## 2023-07-13 NOTE — Telephone Encounter (Signed)
  [facility]   Pre-operative Risk Assessment   HEARTCARE STAFF-IMPORTANT INSTRUCTIONS 1 Red and Blue Text will auto delete once note is signed or closed. 2 Press F2 to navigate through template.   3 On drop down lists, L click to select >> R click to activate next field 4 Reason for Visit format is IMPORTANT!!  See Directions on No. 2 below. 5 Please review chart to determine if there is already a clearance note open for this procedure!!  DO NOT duplicate if a note already exists!!    :1}     Request for Surgical Clearance    Procedure:  Dental Extraction - Amount of Teeth to be Pulled:  2,4,7,9 all remaining upper teeth, UR & UL alvcoloplasty  Date of Surgery:  Clearance TBD                                 Surgeon:  Alberteen Spindle  Surgeon's Group or Practice Name:  [facility] Phone number:  [phone] Fax number:  [phone]   Type of Clearance Requested:   - Medical    Type of Anesthesia:   Liodcaine, Marcaine & Nitrous   Additional requests/questions:  Please fax a copy of medical clearance  to the surgeon's office.  Signed, Linda Hedges   07/13/2023, 11:26 AM   [facility] [place], FNP [facility] [address] Heart and Vascular [place] [address] [phone] (office) [phone] (fax)

## 2023-07-13 NOTE — Telephone Encounter (Signed)
 Clearance form faxed via epic

## 2023-08-26 ENCOUNTER — Telehealth (HOSPITAL_COMMUNITY): Payer: Self-pay

## 2023-08-26 NOTE — Telephone Encounter (Signed)
 Called to confirm/remind patient of their appointment at the [facility] on 08/30/23.   Appointment:   [] Confirmed  [] Left mess   [x] No answer/No voice mail  [] VM Full/unable to leave message  [] Phone not in service

## 2023-08-26 NOTE — Progress Notes (Incomplete)
 ADVANCED [facility] CLIN[facility] NOTE   PCP:  Genora Kidd, MD  Primary HF: Dr. Mitzie Anda  Reason for Visit: Heart Failure Follow-up HPI: Kenneth Robertson is a 43 y.o. male  with history of HTN and nonischemic cardiomyopathy who presents for followup of CHF.  He was admitted in 12/17 after 1 month of worsening dyspnea.  He was volume overloaded on exam with EF 10-15% on echo.  He was diuresed and RHC/LHC done.  This showed no CAD. Cardiac output was low.  Cardiac MRI in 4/18 showed severe LV dilation, EF 24%, prominent trabeculation (possible noncompaction), RV insertion LGE (nonspecific).     Echo in 6/20 showed that LV was severely dilated with EF 25-30%.  CPX done in 7/19 surprisingly did not show a significant CHF limitation.  He saw Dr Rodolfo Clan and ICD was recommended but he deferred.   Echo in 4/21 showed EF 35-40%, diffuse hypokinesis, normal RV.  Echo 5/23 showed EF 30-35%, moderate LV dilation, mildly decreased RV systolic function.    Echo 2/25 with severe LV dilation and EF 25%, RV mildly decreased systolic function, IVC normal.   Today he returns for HF follow up. No dyspnea walking on flat ground or up a flight of stairs.  Weight down 3 lbs.  No orthopnea/PND.  He uses Lsaix about 3 times/week.  No chest pain.  No palpitations or lightheadedness.   He returns today for heart failure follow up. Overall feeling ***. NYHA ***. Reports {Symptoms; cardiac:12860::"dyspnea","fatigue"}. Denies {Symptoms; cardiac:12860::"chest pain","dyspnea","fatigue","near-syncope","orthopnea","palpitations","dizziness","abnormal bleeding"}. Able to perform ADLs. Appetite okay. Weight at home ***. BP at home***. Compliant with all medications.    PMH: 1. HTN 2. Depression 3. Chronic systolic CHF: Nonischemic cardiomyopathy, diagnosed in 12/17.  - Echo (12/17) with EF 10-15%, LV thrombus, moderate MR, PASP 47 mmHg, moderately dilated and dysfunctional RV.  - Echo (7/18) with EF 15%, moderate MR. PASP 40 mm Hg.  -  LHC/RHC (12/17): No CAD; mean RA 8, PA 41/22, mean PCWP 20, CI 1.7.  - HIV, SPEP, TSH negative.  - Cardiac MRI in 4/18 showed severe LV dilation, EF 24%, prominent trabeculation (possible noncompaction), RV insertion LGE (nonspecific), no LV thrombus.   - Echo (7/18): EF 15%, severe LV dilation with diffuse HK, grade 3 diastolic dysfunction, mild-moderate MR, PASP 40 mmHg.  - Echo (5/19): EF 25-30%, severe LV dilation with diffuse hypokinesis, mild MR, mild RV dilation with normal systolic function.  - CPX (7/19): RER 1.08, peak VO2 30.2, VE/VCO2 slope 31 => no obvious cardiac limitation.  - Echo (6/20): EF 25-30%, severe LV dilation with diffuse hypokinesis, mild MR, normal RV size and systolic function.  - Echo (4/21): EF 35-40%, diffuse hypokinesis, normal RV size and systolic function.  - Echo (4/23): EF 30-35%, moderate LV dilation, mildly decreased RV systolic function.  - Echo (2/25): severe LV dilation with EF 25%, RV mildly decreased systolic function, IVC normal. 4. LV thrombus: On warfarin.  5. Sleep study with no OSA.  6. Type II diabetes 7. Hyperlipidemia  Past Surgical History:  Procedure Laterality Date   CARDIAC CATHETERIZATION N/A 04/02/2016   Procedure: Right/Left Heart Cath and Coronary Angiography;  Surgeon: Darlis Eisenmenger, MD;  Location: [facility] INVASIVE CV LAB;  Service: Cardiovascular;  Laterality: N/A;   EYE SURGERY     Left    TOOTH EXTRACTION     Current Outpatient Medications  Medication Sig Dispense Refill   acetaminophen  (TYLENOL ) 500 MG tablet Take 1 tablet (500 mg total) by mouth every 6 (  six) hours as needed. 30 tablet 0   carvedilol  (COREG ) 12.5 MG tablet Take 1 tablet (12.5 mg total) by mouth 2 (two) times daily. 180 tablet 3   digoxin  (LANOXIN ) 0.125 MG tablet TAKE 1 TABLET BY MOUTH EVERY DAY 90 tablet 3   empagliflozin  (JARDIANCE ) 10 MG TABS tablet Take 1 tablet (10 mg total) by mouth daily before breakfast. 90 tablet 3   furosemide  (LASIX ) 20 MG tablet  Take 1 tablet (20 mg total) by mouth daily as needed (swelling or a weight gain of 3 pounds or more in 24 hours or 5 pounds in 1 week.). Take 1 tablet by mouth as needed 30 tablet 11   potassium chloride  SA (KLOR-CON  M20) 20 MEQ tablet Take 1 tablet (20 mEq total) by mouth daily as needed. When taking Lasix  30 tablet 11   rosuvastatin  (CRESTOR ) 20 MG tablet Take 1 tablet (20 mg total) by mouth daily. 90 tablet 3   sacubitril -valsartan  (ENTRESTO ) 49-51 MG Take 1 tablet by mouth 2 (two) times daily. 60 tablet 11   spironolactone  (ALDACTONE ) 25 MG tablet Take 1 tablet (25 mg total) by mouth daily. 90 tablet 3   XARELTO  20 MG TABS tablet TAKE 1 TABLET BY MOUTH DAILY WITH SUPPER 90 tablet 0   No current facility-administered medications for this visit.   Allergies:   Hydrocodone  and Shrimp [shellfish allergy]   Social History:  The patient  reports that he has never smoked. He has never used smokeless tobacco. He reports that he does not drink alcohol and does not use drugs.   Family History:  The patient's family history includes Cancer in his father; Heart failure in his mother.   ROS:  Please see the history of present illness.   All other systems are personally reviewed and negative.   Wt Readings from Last 3 Encounters:  05/31/23 118.8 kg (261 lb 12.8 oz)  04/15/23 117.9 kg (260 lb)  04/12/23 117.9 kg (260 lb)   There were no vitals taken for this visit.  Physical Exam:   General: NAD Neck: No JVD, no thyromegaly or thyroid  nodule.  Lungs: Clear to auscultation bilaterally with normal respiratory effort. CV: Lateral PMI.  Heart regular S1/S2, no S3/S4, no murmur.  No peripheral edema.  No carotid bruit.  Normal pedal pulses.  Abdomen: Soft, nontender, no hepatosplenomegaly, no distention.  Skin: Intact without lesions or rashes.  Neurologic: Alert and oriented x 3.  Psych: Normal affect. Extremities: No clubbing or cyanosis.  HEENT: Normal.   Assessment & Plan: 1. Chronic  systolic CHF: Nonischemic cardiomyopathy.  EF 10-15% by echo in 12/17.  No coronary disease and low output on cath.  HIV negative, SPEP negative.  No family history of early cardiomyopathy (mother with CHF around 59).  No ETOH/drugs. Possible viral myocarditis.  Cardiac MRI in 4/18 with severe LV dilation, EF 24%, RV insertion site LGE (nonspecific), cannot rule out noncompaction.  Echo in 5/19 showed EF 25-30% with severe LV dilation. CPX in 7/19 was quite good with minimal cardiac limitation.  Echo in 6/20 showed severe LV dilation and EF 25-30%, normal RV systolic function.  Echo in 4/21 showed EF improved to 35-40%.  Echo (5/23) showed  EF 30-35%, moderate LV dilation, mildly decreased RV systolic function.  Echo today showed EF 25%, mild RV dysfunction. He is not volume overloaded on exam and weight is down.  NYHA class II.  - EF < 35%, I recommended ICD.  Not CRT candidate.  He wants to  wait another 6 months and repeat echo.  - Continue Jardiance  10 mg daily. - Change Lasix  20 mg PRN - Continue spironolactone  25 mg daily.  - Continue Coreg  12.5 mg bid - Increase to 49/51 bid, BMET/BNP today and BMET in 10 days.  - Needs EP referral for ICD ***  2. LV thrombus: Not seen on more recent studies, including today.  However, LV remains weak.  He was taken off warfarin by PCP because of failure to comply with INR checks.    - Continue Xarelto  20 mg daily.   3. Hyperlipidemia: he is now back on Crestor , continue.  Followup 2 months with APP.  Follow up in *** with ***  [place] Aarin Sparkman, NP  08/26/2023

## 2023-08-30 ENCOUNTER — Encounter (HOSPITAL_COMMUNITY): Payer: 59

## 2023-09-13 ENCOUNTER — Encounter: Payer: Self-pay | Admitting: *Deleted

## 2023-10-12 NOTE — Progress Notes (Incomplete)
 Date:  10/12/2023   ID:  [name], DOB Mar 03, 1981, MRN [id number]    Provider location: [address], [place] Type of Visit: Established patient  PCP:  Adele Song, MD  Primary HF: Dr. Rolan  CC: HF follow up   HPI: Kenneth Robertson is a 43 y.o. male  with history of HTN and nonischemic cardiomyopathy who presents for followup of CHF.  He was admitted in 12/17 after 1 month of worsening dyspnea.  He was volume overloaded on exam with EF 10-15% on echo.  He was diuresed and RHC/LHC done.  This showed no CAD. Cardiac output was low.  Cardiac MRI in 4/18 showed severe LV dilation, EF 24%, prominent trabeculation (possible noncompaction), RV insertion LGE (nonspecific).     Echo in 6/20 showed that LV was severely dilated with EF 25-30%.  CPX done in 7/19 surprisingly did not show a significant CHF limitation.  He saw Dr Fernande and ICD was recommended but he deferred.   Echo in 4/21 showed EF 35-40%, diffuse hypokinesis, normal RV.  Echo 5/23 showed EF 30-35%, moderate LV dilation, mildly decreased RV systolic function.    Echo 05/2023 severe LV dilation with EF 25%, RV mildly decreased systolic function, IVC normal.   Today he returns for HF follow up.Overall feeling fine. Denies SOB/PND/Orthopnea. Appetite ok. No fever or chills. Weight at home  pounds. Taking all medications.   PMH: 1. HTN 2. Depression 3. Chronic systolic CHF: Nonischemic cardiomyopathy, diagnosed in 12/17.  - Echo (12/17) with EF 10-15%, LV thrombus, moderate MR, PASP 47 mmHg, moderately dilated and dysfunctional RV.  - Echo (7/18) with EF 15%, moderate MR. PASP 40 mm Hg.  - LHC/RHC (12/17): No CAD; mean RA 8, PA 41/22, mean PCWP 20, CI 1.7.  - HIV, SPEP, TSH negative.  - Cardiac MRI in 4/18 showed severe LV dilation, EF 24%, prominent trabeculation (possible noncompaction), RV insertion LGE (nonspecific), no LV thrombus.   - Echo (7/18): EF 15%, severe LV dilation with diffuse HK, grade 3  diastolic dysfunction, mild-moderate MR, PASP 40 mmHg.  - Echo (5/19): EF 25-30%, severe LV dilation with diffuse hypokinesis, mild MR, mild RV dilation with normal systolic function.  - CPX (7/19): RER 1.08, peak VO2 30.2, VE/VCO2 slope 31 => no obvious cardiac limitation.  - Echo (6/20): EF 25-30%, severe LV dilation with diffuse hypokinesis, mild MR, normal RV size and systolic function.  - Echo (4/21): EF 35-40%, diffuse hypokinesis, normal RV size and systolic function.  - Echo (4/23): EF 30-35%, moderate LV dilation, mildly decreased RV systolic function.  - Echo (2/25): severe LV dilation with EF 25%, RV mildly decreased systolic function, IVC normal. 4. LV thrombus: On warfarin.  5. Sleep study with no OSA.  6. Type II diabetes 7. Hyperlipidemia  Past Surgical History:  Procedure Laterality Date   CARDIAC CATHETERIZATION N/A 04/02/2016   Procedure: Right/Left Heart Cath and Coronary Angiography;  Surgeon: Ezra GORMAN Rolan, MD;  Location: [facility] INVASIVE CV LAB;  Service: Cardiovascular;  Laterality: N/A;   EYE SURGERY     Left    TOOTH EXTRACTION     Current Outpatient Medications  Medication Sig Dispense Refill   acetaminophen  (TYLENOL ) 500 MG tablet Take 1 tablet (500 mg total) by mouth every 6 (six) hours as needed. 30 tablet 0   carvedilol  (COREG ) 12.5 MG tablet Take 1 tablet (12.5 mg total) by mouth 2 (two) times daily. 180 tablet 3   digoxin  (  LANOXIN ) 0.125 MG tablet TAKE 1 TABLET BY MOUTH EVERY DAY 90 tablet 3   empagliflozin  (JARDIANCE ) 10 MG TABS tablet Take 1 tablet (10 mg total) by mouth daily before breakfast. 90 tablet 3   furosemide  (LASIX ) 20 MG tablet Take 1 tablet (20 mg total) by mouth daily as needed (swelling or a weight gain of 3 pounds or more in 24 hours or 5 pounds in 1 week.). Take 1 tablet by mouth as needed 30 tablet 11   potassium chloride  SA (KLOR-CON  M20) 20 MEQ tablet Take 1 tablet (20 mEq total) by mouth daily as needed. When taking Lasix  30 tablet 11    rosuvastatin  (CRESTOR ) 20 MG tablet Take 1 tablet (20 mg total) by mouth daily. 90 tablet 3   sacubitril -valsartan  (ENTRESTO ) 49-51 MG Take 1 tablet by mouth 2 (two) times daily. 60 tablet 11   spironolactone  (ALDACTONE ) 25 MG tablet Take 1 tablet (25 mg total) by mouth daily. 90 tablet 3   XARELTO  20 MG TABS tablet TAKE 1 TABLET BY MOUTH DAILY WITH SUPPER 90 tablet 0   No current facility-administered medications for this visit.   Allergies:   Hydrocodone  and Shrimp [shellfish allergy]   Social History:  The patient  reports that he has never smoked. He has never used smokeless tobacco. He reports that he does not drink alcohol and does not use drugs.   Family History:  The patient's family history includes Cancer in his father; Heart failure in his mother.   ROS:  Please see the history of present illness.   All other systems are personally reviewed and negative.   Wt Readings from Last 3 Encounters:  05/31/23 118.8 kg (261 lb 12.8 oz)  04/15/23 117.9 kg (260 lb)  04/12/23 117.9 kg (260 lb)   There were no vitals taken for this visit.  Physical Exam:   General:   No resp difficulty Neck: supple. no JVD.  Cor: PMI nondisplaced. Regular rate & rhythm. No rubs, gallops or murmurs. Lungs: clear Abdomen: soft, nontender, nondistended.  Extremities: no cyanosis, clubbing, rash, edema Neuro: alert & oriented x3    Assessment & Plan: 1. Chronic systolic CHF: Nonischemic cardiomyopathy.  EF 10-15% by echo in 12/17.  No coronary disease and low output on cath.  HIV negative, SPEP negative.  No family history of early cardiomyopathy (mother with CHF around 89).  No ETOH/drugs. Possible viral myocarditis.  Cardiac MRI in 4/18 with severe LV dilation, EF 24%, RV insertion site LGE (nonspecific), cannot rule out noncompaction.  Echo in 5/19 showed EF 25-30% with severe LV dilation. CPX in 7/19 was quite good with minimal cardiac limitation.  Echo in 6/20 showed severe LV dilation and EF 25-30%,  normal RV systolic function.  Echo in 4/21 showed EF improved to 35-40%.  Echo (5/23) showed  EF 30-35%, moderate LV dilation, mildly decreased RV systolic function.  Echo t 05/2023 showed EF 25%, mild RV dysfunction.  -NYHA  - Continue Jardiance  10 mg daily. - Change Lasix  20 mg PRN - Continue spironolactone  25 mg daily.  - Continue Coreg  12.5 mg bid - Continue entresto  49/51 bid Check BMET  2. LV thrombus: Not seen on more recent studies, including today.  However, LV remains weak.  He was taken off warfarin by PCP because of failure to comply with INR checks.    - Continue Xarelto  20 mg daily.  3. Hyperlipidemia: he is now back on Crestor , continue.   Follow up in ***  Bonney Greig Mosses, NP  10/12/2023  [facility] [address] [facility] [place] [address] [phone] (office) [phone] (fax)

## 2023-10-13 ENCOUNTER — Encounter (HOSPITAL_COMMUNITY)

## 2023-10-18 NOTE — Progress Notes (Incomplete)
 Date:  10/19/2023   ID:  Kenneth Robertson, DOB November 12, 1980, MRN [id number]    Provider location: [address], [place] Type of Visit: Established patient  PCP:  Adele Song, MD  Primary HF: Dr. Rolan  CC: HF follow up   HPI: Kenneth Robertson is a 43 y.o. male  with history of HTN and nonischemic cardiomyopathy who presents for followup of CHF.  He was admitted in 12/17 after 1 month of worsening dyspnea.  He was volume overloaded on exam with EF 10-15% on echo.  He was diuresed and RHC/LHC done.  This showed no CAD. Cardiac output was low.  Cardiac MRI in 4/18 showed severe LV dilation, EF 24%, prominent trabeculation (possible noncompaction), RV insertion LGE (nonspecific).     - Echo 6/20: LV severely dilated with EF 25-30%.   - CPX done in 7/19 surprisingly did not show a significant CHF limitation.  He saw Dr Fernande and ICD was recommended but he deferred.  - Echo 4/21: EF 35-40%, diffuse hypokinesis, normal RV.   - Echo 5/23: EF 30-35%, moderate LV dilation, mildly decreased RV systolic function.  - Echo 2/25: severe LV dilation with EF 25%, RV mildly decreased systolic function, IVC normal.   Today he returns for AHF follow up. Overall feeling ok. Denies palpitations, CP, dizziness, edema, or PND/Orthopnea. Gets SOB when walking around in the heat. Appetite ok. No fever or chills. Does not weight at home. Taking all medications. Denies ETOH, tobacco or drug use. Staying under 64 oz of fluid daily. Takes lasix  ~3x/week.    PMH: 1. HTN 2. Depression 3. Chronic systolic CHF: Nonischemic cardiomyopathy, diagnosed in 12/17.  - Echo (12/17) with EF 10-15%, LV thrombus, moderate MR, PASP 47 mmHg, moderately dilated and dysfunctional RV.  - Echo (7/18) with EF 15%, moderate MR. PASP 40 mm Hg.  - LHC/RHC (12/17): No CAD; mean RA 8, PA 41/22, mean PCWP 20, CI 1.7.  - HIV, SPEP, TSH negative.  - Cardiac MRI in 4/18 showed severe LV dilation, EF 24%, prominent trabeculation  (possible noncompaction), RV insertion LGE (nonspecific), no LV thrombus.   - Echo (7/18): EF 15%, severe LV dilation with diffuse HK, grade 3 diastolic dysfunction, mild-moderate MR, PASP 40 mmHg.  - Echo (5/19): EF 25-30%, severe LV dilation with diffuse hypokinesis, mild MR, mild RV dilation with normal systolic function.  - CPX (7/19): RER 1.08, peak VO2 30.2, VE/VCO2 slope 31 => no obvious cardiac limitation.  - Echo (6/20): EF 25-30%, severe LV dilation with diffuse hypokinesis, mild MR, normal RV size and systolic function.  - Echo (4/21): EF 35-40%, diffuse hypokinesis, normal RV size and systolic function.  - Echo (4/23): EF 30-35%, moderate LV dilation, mildly decreased RV systolic function.  - Echo (2/25): severe LV dilation with EF 25%, RV mildly decreased systolic function, IVC normal. 4. LV thrombus: On warfarin.  5. Sleep study with no OSA.  6. Type II diabetes 7. Hyperlipidemia  Past Surgical History:  Procedure Laterality Date   CARDIAC CATHETERIZATION N/A 04/02/2016   Procedure: Right/Left Heart Cath and Coronary Angiography;  Surgeon: Ezra GORMAN Rolan, MD;  Location: [facility] INVASIVE CV LAB;  Service: Cardiovascular;  Laterality: N/A;   EYE SURGERY     Left    TOOTH EXTRACTION     Current Outpatient Medications  Medication Sig Dispense Refill   acetaminophen  (TYLENOL ) 500 MG tablet Take 1 tablet (500 mg total) by mouth every 6 (six) hours as needed. 30 tablet  0   carvedilol  (COREG ) 12.5 MG tablet Take 1 tablet (12.5 mg total) by mouth 2 (two) times daily. 180 tablet 3   digoxin  (LANOXIN ) 0.125 MG tablet TAKE 1 TABLET BY MOUTH EVERY DAY 90 tablet 3   empagliflozin  (JARDIANCE ) 10 MG TABS tablet Take 1 tablet (10 mg total) by mouth daily before breakfast. 90 tablet 3   furosemide  (LASIX ) 20 MG tablet Take 1 tablet (20 mg total) by mouth daily as needed (swelling or a weight gain of 3 pounds or more in 24 hours or 5 pounds in 1 week.). Take 1 tablet by mouth as needed 30 tablet 11    potassium chloride  SA (KLOR-CON  M20) 20 MEQ tablet Take 1 tablet (20 mEq total) by mouth daily as needed. When taking Lasix  30 tablet 11   rosuvastatin  (CRESTOR ) 20 MG tablet Take 1 tablet (20 mg total) by mouth daily. 90 tablet 3   sacubitril -valsartan  (ENTRESTO ) 97-103 MG Take 1 tablet by mouth 2 (two) times daily. 60 tablet 3   spironolactone  (ALDACTONE ) 25 MG tablet Take 1 tablet (25 mg total) by mouth daily. 90 tablet 3   XARELTO  20 MG TABS tablet TAKE 1 TABLET BY MOUTH DAILY WITH SUPPER 90 tablet 0   No current facility-administered medications for this encounter.   Allergies:   Hydrocodone  and Shrimp [shellfish allergy]   Social History:  The patient  reports that he has never smoked. He has never used smokeless tobacco. He reports that he does not drink alcohol and does not use drugs.   Family History:  The patient's family history includes Cancer in his father; Heart failure in his mother.   ROS:  Please see the history of present illness.   All other systems are personally reviewed and negative.   Wt Readings from Last 3 Encounters:  10/19/23 116.5 kg (256 lb 12.8 oz)  05/31/23 118.8 kg (261 lb 12.8 oz)  04/15/23 117.9 kg (260 lb)   BP 124/70   Pulse 82   Wt 116.5 kg (256 lb 12.8 oz)   SpO2 96%   BMI 35.82 kg/m   Physical Exam:   General:  well appearing.  No respiratory difficulty. Walked into clinic.  Neck:  JVD flat.  Cor: PMI nondisplaced. Regular rate & rhythm. No rubs, gallops or murmurs. Lungs: clear Extremities: no cyanosis, clubbing, rash, edema  Neuro: alert & oriented x 3. Moves all 4 extremities w/o difficulty. Affect pleasant.    Assessment & Plan: 1. Chronic systolic CHF: Nonischemic cardiomyopathy.  EF 10-15% by echo in 12/17.  No coronary disease and low output on cath.  HIV negative, SPEP negative.  No family history of early cardiomyopathy (mother with CHF around 58).  No ETOH/drugs. Possible viral myocarditis.  Cardiac MRI in 4/18 with severe LV  dilation, EF 24%, RV insertion site LGE (nonspecific), cannot rule out noncompaction.  Echo in 5/19 showed EF 25-30% with severe LV dilation. CPX in 7/19 was quite good with minimal cardiac limitation.  Echo in 6/20 showed severe LV dilation and EF 25-30%, normal RV systolic function.  Echo in 4/21 showed EF improved to 35-40%.  Echo (5/23) showed  EF 30-35%, moderate LV dilation, mildly decreased RV systolic function.  Echo 2/25 showed EF 25%, mild RV dysfunction. ICD recommended. Not CRT candidate. Patient wants to wait 6 months (from 2/25) and repeat echo.  - NYHA II - Appears euvolemic on exam.  - Continue Jardiance  10 mg daily. - Change Lasix  20 mg PRN - Continue spironolactone  25  mg daily.  - Continue Coreg  12.5 mg bid - Increase entresto  97/103 bid. Labs today. Repeat in 7-10 days.  - Update echo at follow up   2. LV thrombus: Not seen on more recent studies, including today.  However, LV remains weak.  He was taken off warfarin by PCP because of failure to comply with INR checks.    - Continue Xarelto  20 mg daily.   3. Hyperlipidemia: he is now back on Crestor , continue.  Handicap placard form filled out today. Prescription for BP cuff given.   Follow up in 2-3 months with Dr. Rolan  + echo.   Signed, Beckey LITTIE Coe, NP  10/19/2023  [facility] [address] [facility] and [facility] [place] [address] [phone] (office) [phone] (fax)

## 2023-10-19 ENCOUNTER — Ambulatory Visit (HOSPITAL_COMMUNITY): Payer: Self-pay | Admitting: Internal Medicine

## 2023-10-19 ENCOUNTER — Encounter (HOSPITAL_COMMUNITY): Payer: Self-pay

## 2023-10-19 ENCOUNTER — Ambulatory Visit (HOSPITAL_COMMUNITY)
Admission: RE | Admit: 2023-10-19 | Discharge: 2023-10-19 | Disposition: A | Discharge status: Home / Self Care | Source: Ambulatory Visit | Attending: Internal Medicine | Admitting: Internal Medicine

## 2023-10-19 VITALS — BP 124/70 | HR 82 | Wt 256.8 lb

## 2023-10-19 DIAGNOSIS — Z79899 Other long term (current) drug therapy: Secondary | ICD-10-CM | POA: Diagnosis not present

## 2023-10-19 DIAGNOSIS — Z7901 Long term (current) use of anticoagulants: Secondary | ICD-10-CM | POA: Diagnosis not present

## 2023-10-19 DIAGNOSIS — Z7984 Long term (current) use of oral hypoglycemic drugs: Secondary | ICD-10-CM | POA: Insufficient documentation

## 2023-10-19 DIAGNOSIS — E785 Hyperlipidemia, unspecified: Secondary | ICD-10-CM | POA: Diagnosis not present

## 2023-10-19 DIAGNOSIS — I5022 Chronic systolic (congestive) heart failure: Secondary | ICD-10-CM | POA: Diagnosis not present

## 2023-10-19 DIAGNOSIS — I110 Hypertensive heart disease with heart failure: Secondary | ICD-10-CM | POA: Diagnosis not present

## 2023-10-19 DIAGNOSIS — I513 Intracardiac thrombosis, not elsewhere classified: Secondary | ICD-10-CM | POA: Diagnosis not present

## 2023-10-19 DIAGNOSIS — I428 Other cardiomyopathies: Secondary | ICD-10-CM | POA: Insufficient documentation

## 2023-10-19 LAB — BASIC METABOLIC PANEL WITH GFR
Anion gap: 9 (ref 5–15)
BUN: 15 mg/dL (ref 6–20)
CO2: 25 mmol/L (ref 22–32)
Calcium: 9.4 mg/dL (ref 8.9–10.3)
Chloride: 102 mmol/L (ref 98–111)
Creatinine, Ser: 1.09 mg/dL (ref 0.61–1.24)
GFR, Estimated: >60 mL/min (ref >60)
Glucose, Bld: 157 mg/dL — ABNORMAL HIGH (ref 70–99)
Potassium: 4.3 mmol/L (ref 3.5–5.1)
Sodium: 136 mmol/L (ref 135–145)

## 2023-10-19 LAB — BRAIN NATRIURETIC PEPTIDE: B Natriuretic Peptide: 96.7 pg/mL (ref 0.0–100.0)

## 2023-10-19 MED ORDER — RIVAROXABAN 20 MG PO TABS: 20.0000 mg | ORAL_TABLET | Freq: Every day | ORAL | 3 refills | Status: AC

## 2023-10-19 MED ORDER — SACUBITRIL-VALSARTAN 97-103 MG PO TABS: 1.0000 | ORAL_TABLET | Freq: Two times a day (BID) | ORAL | 3 refills | Status: AC

## 2023-10-19 NOTE — Patient Instructions (Addendum)
 Medication Changes:  INCREASE ENTRESTO  97/103MG  TWICE DAILY   PRESCRIPTION GIVEN FOR BLOOD PRESSURE CUFF  Lab Work:  Labs done today, your results will be available in MyChart, we will contact you for abnormal readings.  THEN LABS AGAIN IN IN 7 DAYS AS SCHEDULED  Follow-Up in: 2 MONTHS AS SCHEDULED WITH DR. ROLAN WITH AN ECHO ON THE SAME DAY   At the [facility], you and your health needs are our priority. We have a designated team specialized in the treatment of Heart Failure. This Care Team includes your primary Heart Failure Specialized Cardiologist (physician), Advanced Practice Providers (APPs- Physician Assistants and Nurse Practitioners), and Pharmacist who all work together to provide you with the care you need, when you need it.   You may see any of the following providers on your designated Care Team at your next follow up:  Dr. Toribio Fuel Dr. Ezra ROLAN Dr. Ria Commander Dr. Odis Brownie Greig Mosses, NP [place]affie Shed, [place] [facility], [place] Beckey Coe, NP [place] Evenson, NP Tinnie Redman, PharmD   Please be sure to bring in all your medications bottles to every appointment.   Need to Contact Us :  If you have any questions or concerns before your next appointment please send us  a message through [place] or call our office at [phone].    TO LEAVE A MESSAGE FOR THE NURSE SELECT OPTION 2, PLEASE LEAVE A MESSAGE INCLUDING: YOUR NAME DATE OF BIRTH CALL BACK NUMBER REASON FOR CALL**this is important as we prioritize the call backs  YOU WILL RECEIVE A CALL BACK THE SAME DAY AS LONG AS YOU CALL BEFORE 4:00 PM

## 2023-10-19 NOTE — Addendum Note (Signed)
 Encounter addended by: Uday Jantz B, RN on: 10/19/2023 4:24 PM  Actions taken: Order list changed

## 2023-10-26 ENCOUNTER — Ambulatory Visit (HOSPITAL_COMMUNITY): Payer: Self-pay | Admitting: Cardiology

## 2023-10-26 ENCOUNTER — Ambulatory Visit (HOSPITAL_COMMUNITY)
Admission: RE | Admit: 2023-10-26 | Discharge: 2023-10-26 | Disposition: A | Discharge status: Home / Self Care | Source: Ambulatory Visit | Attending: Cardiology | Admitting: Cardiology

## 2023-10-26 DIAGNOSIS — I5022 Chronic systolic (congestive) heart failure: Secondary | ICD-10-CM | POA: Diagnosis not present

## 2023-10-26 LAB — BASIC METABOLIC PANEL WITH GFR
Anion gap: 9 (ref 5–15)
BUN: 12 mg/dL (ref 6–20)
CO2: 24 mmol/L (ref 22–32)
Calcium: 9.1 mg/dL (ref 8.9–10.3)
Chloride: 102 mmol/L (ref 98–111)
Creatinine, Ser: 1.34 mg/dL — ABNORMAL HIGH (ref 0.61–1.24)
GFR, Estimated: >60 mL/min (ref >60)
Glucose, Bld: 147 mg/dL — ABNORMAL HIGH (ref 70–99)
Potassium: 4.2 mmol/L (ref 3.5–5.1)
Sodium: 135 mmol/L (ref 135–145)

## 2023-11-08 ENCOUNTER — Encounter (HOSPITAL_COMMUNITY): Payer: Self-pay

## 2023-11-29 ENCOUNTER — Encounter (HOSPITAL_COMMUNITY): Payer: Self-pay

## 2023-11-29 NOTE — Telephone Encounter (Signed)
 Updated forms e-mailed to [email or web address]. copy also left at front desk for patient

## 2023-12-21 ENCOUNTER — Encounter (HOSPITAL_COMMUNITY): Admitting: Cardiology

## 2023-12-21 ENCOUNTER — Ambulatory Visit (HOSPITAL_COMMUNITY): Admission: RE | Admit: 2023-12-21 | Source: Ambulatory Visit

## 2024-01-04 ENCOUNTER — Other Ambulatory Visit (HOSPITAL_COMMUNITY): Payer: Self-pay | Admitting: Cardiology

## 2024-03-31 ENCOUNTER — Other Ambulatory Visit (HOSPITAL_COMMUNITY): Payer: Self-pay | Admitting: Family Medicine

## 2024-03-31 DIAGNOSIS — I5022 Chronic systolic (congestive) heart failure: Secondary | ICD-10-CM

## 2024-04-04 ENCOUNTER — Other Ambulatory Visit (HOSPITAL_COMMUNITY): Payer: Self-pay | Admitting: Family Medicine

## 2024-04-19 ENCOUNTER — Other Ambulatory Visit (HOSPITAL_COMMUNITY): Payer: Self-pay | Admitting: Family Medicine

## 2024-05-07 ENCOUNTER — Telehealth (HOSPITAL_COMMUNITY): Payer: Self-pay

## 2024-05-07 NOTE — Progress Notes (Signed)
 "    Date:  05/08/2024   ID:  [name], DOB 1981-02-20, MRN [id number]    Provider location: [address] [place] Type of Visit: Established patient  PCP:  Adele Song, MD  Primary HF: Dr. Rolan  CC: HF follow up   HPI: Kenneth Robertson is a 44 y.o. male  with history of HTN and nonischemic cardiomyopathy who presents for followup of CHF.  He was admitted in 12/17 after 1 month of worsening dyspnea.  He was volume overloaded on exam with EF 10-15% on echo.  He was diuresed and RHC/LHC done.  This showed no CAD. Cardiac output was low.  Cardiac MRI in 4/18 showed severe LV dilation, EF 24%, prominent trabeculation (possible noncompaction), RV insertion LGE (nonspecific).     - Echo 6/20: LV severely dilated with EF 25-30%.   - CPX done in 7/19 surprisingly did not show a significant CHF limitation.  He saw Dr Fernande and ICD was recommended but he deferred.  - Echo 4/21: EF 35-40%, diffuse hypokinesis, normal RV.   - Echo 5/23: EF 30-35%, moderate LV dilation, mildly decreased RV systolic function.  - Echo 2/25: severe LV dilation with EF 25%, RV mildly decreased systolic function, IVC normal.   Today he returns for AHF follow up. Overall feeling ok, just concerned about weight and ongoing depression. Denies palpitations, CP, dizziness, or PND/Orthopnea. Edema in abd at times. SOB at night when laying flat. Appetite ok, does not watch what he eats. No fever or chills. Weight at home 255 pounds. Has not been compliant with medications in the setting of depression, has been taking meds maybe 2 days/week. Denies tobacco or drug use. Has been drinking a little bit more ETOH, usually drinks 1/2 can 4 loco/day. Does not take lasix  much, last took Friday because PCP asked him too, reports brisk UOP.    PMH: 1. HTN 2. Depression 3. Chronic systolic CHF: Nonischemic cardiomyopathy, diagnosed in 12/17.  - Echo (12/17) with EF 10-15%, LV thrombus, moderate MR, PASP 47 mmHg, moderately  dilated and dysfunctional RV.  - Echo (7/18) with EF 15%, moderate MR. PASP 40 mm Hg.  - LHC/RHC (12/17): No CAD; mean RA 8, PA 41/22, mean PCWP 20, CI 1.7.  - HIV, SPEP, TSH negative.  - Cardiac MRI in 4/18 showed severe LV dilation, EF 24%, prominent trabeculation (possible noncompaction), RV insertion LGE (nonspecific), no LV thrombus.   - Echo (7/18): EF 15%, severe LV dilation with diffuse HK, grade 3 diastolic dysfunction, mild-moderate MR, PASP 40 mmHg.  - Echo (5/19): EF 25-30%, severe LV dilation with diffuse hypokinesis, mild MR, mild RV dilation with normal systolic function.  - CPX (7/19): RER 1.08, peak VO2 30.2, VE/VCO2 slope 31 => no obvious cardiac limitation.  - Echo (6/20): EF 25-30%, severe LV dilation with diffuse hypokinesis, mild MR, normal RV size and systolic function.  - Echo (4/21): EF 35-40%, diffuse hypokinesis, normal RV size and systolic function.  - Echo (4/23): EF 30-35%, moderate LV dilation, mildly decreased RV systolic function.  - Echo (2/25): severe LV dilation with EF 25%, RV mildly decreased systolic function, IVC normal. 4. LV thrombus: On warfarin.  5. Sleep study with no OSA.  6. Type II diabetes 7. Hyperlipidemia  Past Surgical History:  Procedure Laterality Date   CARDIAC CATHETERIZATION N/A 04/02/2016   Procedure: Right/Left Heart Cath and Coronary Angiography;  Surgeon: Ezra GORMAN Rolan, MD;  Location: [facility]VASIVE CV LAB;  Service: Cardiovascular;  Laterality: N/A;   EYE SURGERY     Left    TOOTH EXTRACTION     Current Outpatient Medications  Medication Sig Dispense Refill   acetaminophen  (TYLENOL ) 500 MG tablet Take 1 tablet (500 mg total) by mouth every 6 (six) hours as needed. 30 tablet 0   carvedilol  (COREG ) 12.5 MG tablet Take 1 tablet (12.5 mg total) by mouth 2 (two) times daily. PLEASE SCHEDULE APPOINTMENT FOR MORE REFILLS [phone] OPTION 2 180 tablet 0   digoxin  (LANOXIN ) 0.125 MG tablet TAKE 1 TABLET BY MOUTH EVERY DAY 90 tablet 3    empagliflozin  (JARDIANCE ) 10 MG TABS tablet Take 1 tablet (10 mg total) by mouth daily before breakfast. 90 tablet 3   furosemide  (LASIX ) 20 MG tablet Take 1 tablet (20 mg total) by mouth daily as needed (swelling or a weight gain of 3 pounds or more in 24 hours or 5 pounds in 1 week.). Take 1 tablet by mouth as needed 30 tablet 11   potassium chloride  SA (KLOR-CON  M20) 20 MEQ tablet Take 1 tablet (20 mEq total) by mouth daily as needed. When taking Lasix  30 tablet 11   rivaroxaban  (XARELTO ) 20 MG TABS tablet Take 1 tablet (20 mg total) by mouth daily with supper. 90 tablet 3   rosuvastatin  (CRESTOR ) 20 MG tablet TAKE 1 TABLET BY MOUTH EVERY DAY 90 tablet 3   sacubitril -valsartan  (ENTRESTO ) 97-103 MG Take 1 tablet by mouth 2 (two) times daily. 60 tablet 3   spironolactone  (ALDACTONE ) 25 MG tablet TAKE 1 TABLET (25 MG TOTAL) BY MOUTH DAILY. 90 tablet 3   No current facility-administered medications for this encounter.   Allergies:   Hydrocodone  and Shrimp [shellfish allergy]   Social History:  The patient  reports that he has never smoked. He has never used smokeless tobacco. He reports that he does not drink alcohol and does not use drugs.   Family History:  The patient's family history includes Cancer in his father; Heart failure in his mother.   ROS:  Please see the history of present illness.   All other systems are personally reviewed and negative.   Wt Readings from Last 3 Encounters:  05/08/24 116.4 kg (256 lb 9.6 oz)  10/19/23 116.5 kg (256 lb 12.8 oz)  05/31/23 118.8 kg (261 lb 12.8 oz)   BP 112/82   Pulse 85   Wt 116.4 kg (256 lb 9.6 oz)   SpO2 96%   BMI 35.79 kg/m   Physical Exam:   General:  well appearing.  No respiratory difficulty. Walked into clinic.  Neck: JVD difficult to see, does not appear significantly elevated.  Cor: Regular rate & rhythm. No murmurs. Lungs: clear Extremities: no edema  Neuro: alert & oriented x 3. Affect pleasant.   EKG NSR 81, LVH, QRS  130, TWA (as seen on previous EKG) (Personally reviewed)    Assessment & Plan: 1. Chronic systolic CHF: Nonischemic cardiomyopathy.  EF 10-15% by echo in 12/17.  No coronary disease and low output on cath.  HIV negative, SPEP negative.  No family history of early cardiomyopathy (mother with CHF around 42).  No ETOH/drugs. Possible viral myocarditis.  Cardiac MRI in 4/18 with severe LV dilation, EF 24%, RV insertion site LGE (nonspecific), cannot rule out noncompaction.  Echo in 5/19 showed EF 25-30% with severe LV dilation. CPX in 7/19 was quite good with minimal cardiac limitation.  Echo in 6/20 showed severe LV dilation and EF 25-30%, normal RV systolic function.  Echo in  4/21 showed EF improved to 35-40%.  Echo (5/23) showed  EF 30-35%, moderate LV dilation, mildly decreased RV systolic function.  Echo 2/25 showed EF 25%, mild RV dysfunction. ICD recommended. Not CRT candidate.   - NYHA II-IIIa - Appears euvolemic on exam.  - Continue Jardiance  10 mg daily. Denies GU symptoms.  - Continue Lasix  20 mg PRN - Continue spironolactone  25 mg daily.  - Continue Coreg  12.5 mg bid - Continue entresto  97/103 bid.  - Continue Digoxin  0.125 mg daily. Asked him to come back next week for trough.   - Has only been taking medications maybe 2 days a week due to depression. PCP starting Zoloft, he's picking it up today. Plan to possibly schedule echo at next visit if medication compliance has improved.  - He had labs done at PCP on Friday, we requested them to be faxed while we were in the room as he was on the phone with them. Will review. Will add CBC if not checked at PCP.   2. LV thrombus: Not seen on more recent studies.  However, LV remains weak.  He was taken off warfarin by PCP because of failure to comply with INR checks.    - Continue Xarelto  20 mg daily. Stressed importance of compliance with Xarelto , verbalized understanding.   3. Hyperlipidemia: Continue Crestor . Update lipid panel with 1 week labs.    4. ETOH use - Drinking daily with ongoing depression. Starting Zoloft soon - Cessation advised  5 yr handicap placard form filled out today.   Follow up with APP in ~4 weeks to follow medication compliance. Can consider scheduling echo at that time with a couple more weeks on good GDMT.   Signed, Kenneth LITTIE Coe, NP  05/08/2024  [facility] [address] [facility] [place] [address] [phone] (office) [phone] (fax) "

## 2024-05-08 ENCOUNTER — Encounter (HOSPITAL_COMMUNITY): Payer: Self-pay

## 2024-05-08 ENCOUNTER — Ambulatory Visit (HOSPITAL_COMMUNITY)
Admission: RE | Admit: 2024-05-08 | Discharge: 2024-05-08 | Disposition: A | Source: Ambulatory Visit | Attending: Internal Medicine

## 2024-05-08 VITALS — BP 112/82 | HR 85 | Wt 256.6 lb

## 2024-05-08 DIAGNOSIS — F32A Depression, unspecified: Secondary | ICD-10-CM | POA: Insufficient documentation

## 2024-05-08 DIAGNOSIS — F1090 Alcohol use, unspecified, uncomplicated: Secondary | ICD-10-CM | POA: Insufficient documentation

## 2024-05-08 DIAGNOSIS — Z79899 Other long term (current) drug therapy: Secondary | ICD-10-CM | POA: Insufficient documentation

## 2024-05-08 DIAGNOSIS — I428 Other cardiomyopathies: Secondary | ICD-10-CM | POA: Insufficient documentation

## 2024-05-08 DIAGNOSIS — I513 Intracardiac thrombosis, not elsewhere classified: Secondary | ICD-10-CM

## 2024-05-08 DIAGNOSIS — I5022 Chronic systolic (congestive) heart failure: Secondary | ICD-10-CM | POA: Diagnosis not present

## 2024-05-08 DIAGNOSIS — E785 Hyperlipidemia, unspecified: Secondary | ICD-10-CM | POA: Insufficient documentation

## 2024-05-08 DIAGNOSIS — I110 Hypertensive heart disease with heart failure: Secondary | ICD-10-CM | POA: Insufficient documentation

## 2024-05-08 DIAGNOSIS — Z91148 Patient's other noncompliance with medication regimen for other reason: Secondary | ICD-10-CM | POA: Insufficient documentation

## 2024-05-08 DIAGNOSIS — Z86718 Personal history of other venous thrombosis and embolism: Secondary | ICD-10-CM | POA: Insufficient documentation

## 2024-05-08 DIAGNOSIS — Z7984 Long term (current) use of oral hypoglycemic drugs: Secondary | ICD-10-CM | POA: Insufficient documentation

## 2024-05-08 DIAGNOSIS — E8770 Fluid overload, unspecified: Secondary | ICD-10-CM | POA: Insufficient documentation

## 2024-05-08 DIAGNOSIS — Z7901 Long term (current) use of anticoagulants: Secondary | ICD-10-CM | POA: Insufficient documentation

## 2024-05-08 MED ORDER — CARVEDILOL 12.5 MG PO TABS: 12.5000 mg | ORAL_TABLET | Freq: Two times a day (BID) | ORAL | 0 refills | Status: AC

## 2024-05-08 NOTE — Patient Instructions (Signed)
 Medication Changes:  CARVEDILOL  REFILLED   Lab Work:  RETURN FOR LABS IN 1 WEEK AS SCHEDULED-- DO NOT TAKE DIGOXIN  THE MORNING OF THIS APT   Follow-Up in: 4 WEEKS AS SCHEDULED WITH APP CLINIC   At the [facility], you and your health needs are our priority. We have a designated team specialized in the treatment of Heart Failure. This Care Team includes your primary Heart Failure Specialized Cardiologist (physician), Advanced Practice Providers (APPs- Physician Assistants and Nurse Practitioners), and Pharmacist who all work together to provide you with the care you need, when you need it.   You may see any of the following providers on your designated Care Team at your next follow up:  Dr. Toribio Fuel Dr. Ezra Shuck Dr. Odis Brownie Greig Mosses, NP Caffie Shed, [place] [facility] [place], [place] Beckey Coe, NP Jordan Haycraft, NP Tinnie Redman, PharmD   Please be sure to bring in all your medications bottles to every appointment.   Need to Contact Us :  If you have any questions or concerns before your next appointment please send us  a message through [place] or call our office at [phone].    TO LEAVE A MESSAGE FOR THE NURSE SELECT OPTION 2, PLEASE LEAVE A MESSAGE INCLUDING: YOUR NAME DATE OF BIRTH CALL BACK NUMBER REASON FOR CALL**this is important as we prioritize the call backs  YOU WILL RECEIVE A CALL BACK THE SAME DAY AS LONG AS YOU CALL BEFORE 4:00 PM

## 2024-05-15 ENCOUNTER — Ambulatory Visit (HOSPITAL_COMMUNITY)

## 2024-06-05 ENCOUNTER — Ambulatory Visit (HOSPITAL_COMMUNITY)
